# Patient Record
Sex: Female | Born: 2000 | Race: Black or African American | Hispanic: No | Marital: Single | Smoking: Current every day smoker
Health system: Southern US, Community
[De-identification: ages and names within clinical notes are randomized; demographics above are authoritative.]

## PROBLEM LIST (undated history)

## (undated) DIAGNOSIS — D649 Anemia, unspecified: Secondary | ICD-10-CM

## (undated) DIAGNOSIS — J039 Acute tonsillitis, unspecified: Secondary | ICD-10-CM

## (undated) DIAGNOSIS — I10 Essential (primary) hypertension: Secondary | ICD-10-CM

## (undated) DIAGNOSIS — N390 Urinary tract infection, site not specified: Secondary | ICD-10-CM

## (undated) HISTORY — PX: UMBILICAL HERNIA REPAIR: SHX196

---
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
## 2019-04-29 ENCOUNTER — Emergency Department (HOSPITAL_COMMUNITY)
Admission: EM | Admit: 2019-04-29 | Discharge: 2019-04-30 | Disposition: A | Discharge status: Home / Self Care | Payer: Medicaid Other | Attending: Emergency Medicine | Admitting: Emergency Medicine

## 2019-04-29 ENCOUNTER — Encounter (HOSPITAL_COMMUNITY): Payer: Self-pay | Admitting: Obstetrics and Gynecology

## 2019-04-29 ENCOUNTER — Other Ambulatory Visit: Payer: Self-pay

## 2019-04-29 DIAGNOSIS — J029 Acute pharyngitis, unspecified: Secondary | ICD-10-CM | POA: Insufficient documentation

## 2019-04-29 DIAGNOSIS — Z7722 Contact with and (suspected) exposure to environmental tobacco smoke (acute) (chronic): Secondary | ICD-10-CM | POA: Diagnosis not present

## 2019-04-29 DIAGNOSIS — J039 Acute tonsillitis, unspecified: Secondary | ICD-10-CM | POA: Diagnosis not present

## 2019-04-29 MED ORDER — DEXAMETHASONE SODIUM PHOSPHATE 10 MG/ML IJ SOLN
10.0000 mg | Freq: Once | INTRAMUSCULAR | Status: AC
  Administered 2019-04-29: 10 mg via INTRAMUSCULAR
  Filled 2019-04-29: qty 1

## 2019-04-29 MED ORDER — IBUPROFEN 100 MG/5ML PO SUSP
600.0000 mg | Freq: Once | ORAL | Status: AC
  Administered 2019-04-29: 600 mg via ORAL
  Filled 2019-04-29 (×2): qty 30

## 2019-04-29 NOTE — ED Notes (Signed)
Pt able to tolerate PO fluids without issue. 

## 2019-04-29 NOTE — ED Triage Notes (Signed)
Patient reports pain in her throat and difficulty swallowing.  Patient has grade 4 tonsils, as they are touching.  Patient is alert and oreiented x3 and reports pain with swallowing.

## 2019-04-30 ENCOUNTER — Encounter (HOSPITAL_COMMUNITY): Payer: Self-pay | Admitting: Emergency Medicine

## 2019-04-30 LAB — GROUP A STREP BY PCR: Group A Strep by PCR: NOT DETECTED

## 2019-04-30 MED ORDER — AMOXICILLIN 500 MG PO CAPS: 500.0000 mg | ORAL_CAPSULE | Freq: Three times a day (TID) | ORAL | 0 refills | Status: DC

## 2019-04-30 NOTE — ED Provider Notes (Signed)
[facility]y [facility] Provider Note   CSN: [id number] Arrival date & time: 04/29/19  2243     History   Chief Complaint Chief Complaint  Patient presents with  . Sore Throat    HPI Judy Ballard is a 18 y.o. female.     The history is provided by the patient.  Sore Throat This is a recurrent problem. The current episode started more than 2 days ago. The problem occurs constantly. The problem has not changed since onset.Pertinent negatives include no chest pain, no abdominal pain, no headaches and no shortness of breath. Nothing aggravates the symptoms. Nothing relieves the symptoms. She has tried nothing for the symptoms. The treatment provided no relief.  Sore throat for 3 days.  No change in voice.  No cough.  No SOB, no neck pain or swelling.  No f/c/r.  Was told she had enlarged tonsils but has not done anything about it.    History reviewed. No pertinent past medical history.  Patient Active Problem List   Diagnosis Date Noted  . Right knee injury, initial encounter 08/10/2017    Past Surgical History:  Procedure Laterality Date  . UMBILICAL HERNIA REPAIR       OB History   No obstetric history on file.      Home Medications    Prior to Admission medications   Not on File    Family History No family history on file.  Social History Social History   Tobacco Use  . Smoking status: Passive Smoke Exposure - Never Smoker  . Smokeless tobacco: Never Used  Substance Use Topics  . Alcohol use: No  . Drug use: Not Currently     Allergies   Apple and Fish allergy   Review of Systems Review of Systems  Constitutional: Negative for fever.  HENT: Positive for sore throat. Negative for congestion, drooling, facial swelling, trouble swallowing and voice change.   Eyes: Negative for visual disturbance.  Respiratory: Negative for shortness of breath.   Cardiovascular: Negative for chest pain.  Gastrointestinal: Negative for  abdominal pain.  Genitourinary: Negative for difficulty urinating.  Musculoskeletal: Negative for neck pain.  Skin: Negative for rash.  Neurological: Negative for headaches.  Psychiatric/Behavioral: Negative for agitation.  All other systems reviewed and are negative.    Physical Exam Updated Vital Signs BP 133/84 (BP Location: Left Arm)   Pulse (!) 108   Temp 99.1 F (37.3 C) (Oral)   SpO2 98%   Physical Exam Vitals signs and nursing note reviewed.  Constitutional:      General: She is not in acute distress.    Appearance: She is normal weight.  HENT:     Head: Normocephalic and atraumatic.     Nose: Nose normal.     Mouth/Throat:     Mouth: Mucous membranes are moist.     Pharynx: No oropharyngeal exudate or posterior oropharyngeal erythema.     Comments: Enlarged tonsils but not touching, intact phonation, no pain with displacement of the trachea Eyes:     Conjunctiva/sclera: Conjunctivae normal.     Pupils: Pupils are equal, round, and reactive to light.  Neck:     Musculoskeletal: Normal range of motion and neck supple.  Cardiovascular:     Rate and Rhythm: Normal rate and regular rhythm.     Pulses: Normal pulses.     Heart sounds: Normal heart sounds.  Pulmonary:     Effort: Pulmonary effort is normal.     Breath sounds:  Normal breath sounds.  Abdominal:     General: Abdomen is flat. Bowel sounds are normal.     Tenderness: There is no abdominal tenderness. There is no guarding or rebound.  Musculoskeletal: Normal range of motion.  Skin:    General: Skin is warm and dry.     Capillary Refill: Capillary refill takes less than 2 seconds.  Neurological:     General: No focal deficit present.     Mental Status: She is alert and oriented to person, place, and time.  Psychiatric:        Mood and Affect: Mood normal.        Behavior: Behavior normal.      ED Treatments / Results  Labs (all labs ordered are listed, but only abnormal results are displayed)  Labs Reviewed  GROUP A STREP BY PCR    EKG None  Radiology No results found.  Procedures Procedures (including critical care time)  Medications Ordered in ED Medications  ibuprofen (ADVIL) 100 MG/5ML suspension 600 mg (600 mg Oral Given 04/29/19 2349)  dexamethasone (DECADRON) injection 10 mg (10 mg Intramuscular Given 04/29/19 2352)    Follow up with ENT for ongoing care.  Will cover with PO antibiotics.  Strict return precautions given.  Alternate tylenol and ibuprofen.    Judy Ballard was evaluated in Emergency Department on 04/30/2019 for the symptoms described in the history of present illness. She was evaluated in the context of the global COVID-19 pandemic, which necessitated consideration that the patient might be at risk for infection with the SARS-CoV-2 virus that causes COVID-19. Institutional protocols and algorithms that pertain to the evaluation of patients at risk for COVID-19 are in a state of rapid change based on information released by regulatory bodies including the CDC and federal and state organizations. These policies and algorithms were followed during the patient's care in the ED.   Final Clinical Impressions(s) / ED Diagnoses   Return for intractable cough, coughing up blood,fevers >100.4 unrelieved by medication, shortness of breath, intractable vomiting, chest pain, shortness of breath, weakness,numbness, changes in speech, facial asymmetry,abdominal pain, passing out,Inability to tolerate liquids or food, cough, altered mental status or any concerns. No signs of systemic illness or infection. The patient is nontoxic-appearing on exam and vital signs are within normal limits.   I have reviewed the triage vital signs and the nursing notes. Pertinent labs &imaging results that were available during my care of the patient were reviewed by me and considered in my medical decision making (see chart for details).After history, exam, and medical workup  I feel the patient has beenappropriately medically screened and is safe for discharge home. Pertinent diagnoses were discussed with the patient. Patient was given return precautions.   Judy Weakland, MD 04/30/19 [phone]

## 2019-05-18 DIAGNOSIS — J351 Hypertrophy of tonsils: Secondary | ICD-10-CM | POA: Diagnosis not present

## 2019-05-18 DIAGNOSIS — J0391 Acute recurrent tonsillitis, unspecified: Secondary | ICD-10-CM | POA: Diagnosis not present

## 2019-07-04 ENCOUNTER — Emergency Department (HOSPITAL_COMMUNITY)
Admission: EM | Admit: 2019-07-04 | Discharge: 2019-07-04 | Disposition: A | Discharge status: Home / Self Care | Payer: Medicaid Other | Attending: Emergency Medicine | Admitting: Emergency Medicine

## 2019-07-04 ENCOUNTER — Other Ambulatory Visit: Payer: Self-pay

## 2019-07-04 DIAGNOSIS — J029 Acute pharyngitis, unspecified: Secondary | ICD-10-CM | POA: Diagnosis present

## 2019-07-04 DIAGNOSIS — Z79899 Other long term (current) drug therapy: Secondary | ICD-10-CM | POA: Diagnosis not present

## 2019-07-04 DIAGNOSIS — J039 Acute tonsillitis, unspecified: Secondary | ICD-10-CM | POA: Insufficient documentation

## 2019-07-04 DIAGNOSIS — Z7722 Contact with and (suspected) exposure to environmental tobacco smoke (acute) (chronic): Secondary | ICD-10-CM | POA: Diagnosis not present

## 2019-07-04 DIAGNOSIS — Z20828 Contact with and (suspected) exposure to other viral communicable diseases: Secondary | ICD-10-CM | POA: Insufficient documentation

## 2019-07-04 LAB — INFLUENZA PANEL BY PCR (TYPE A & B)
Influenza A By PCR: NEGATIVE
Influenza B By PCR: NEGATIVE

## 2019-07-04 LAB — GROUP A STREP BY PCR: Group A Strep by PCR: NOT DETECTED

## 2019-07-04 MED ORDER — NAPROXEN 500 MG PO TABS: 500.0000 mg | ORAL_TABLET | Freq: Two times a day (BID) | ORAL | 0 refills | Status: DC

## 2019-07-04 NOTE — ED Notes (Signed)
Pt verbalizes understanding of DC instructions. Pt belongings returned and is ambulatory out of ED.  

## 2019-07-04 NOTE — ED Provider Notes (Signed)
[facility] DE[facility] Provider Note   CSN: [id number] Arrival date & time: 07/04/19  1713     History   Chief Complaint Chief Complaint  Patient presents with  . Sore Throat    HPI DEICY RUSK is a 18 y.o. female with PMH significant for tonsillitis presents to the ED with sore throat symptoms.  Patient reports 2-day history of enlarged tonsils with white spots in the back of Ballard throat.  She also endorses headache and fevers yesterday for which she took NyQuil and TheraFlu, with good effect.  She uses hot tea for Ballard sore throat symptoms.  She recently saw an otolaryngologist for chronic tonsillitis.  She denies any inability to eat or drink, difficulty breathing, voice changes, wheezing or stridor, current headache or dizziness, or obvious sick contacts.  She admits that she has not been hydrating well and has dark urine, but no dysuria or abdominal discomfort.     HPI  No past medical history on file.  Patient Active Problem List   Diagnosis Date Noted  . Right knee injury, initial encounter 08/10/2017    Past Surgical History:  Procedure Laterality Date  . UMBILICAL HERNIA REPAIR       OB History   No obstetric history on file.      Home Medications    Prior to Admission medications   Medication Sig Start Date End Date Taking? Authorizing Provider  amoxicillin (AMOXIL) 500 MG capsule Take 1 capsule (500 mg total) by mouth 3 (three) times daily. 04/30/19   Palumbo, April, MD  naproxen (NAPROSYN) 500 MG tablet Take 1 tablet (500 mg total) by mouth 2 (two) times daily. 07/04/19   Corena Herter, PA-C    Family History No family history on file.  Social History Social History   Tobacco Use  . Smoking status: Passive Smoke Exposure - Never Smoker  . Smokeless tobacco: Never Used  Substance Use Topics  . Alcohol use: No  . Drug use: Not Currently     Allergies   Apple and Fish allergy   Review of Systems Review of  Systems  Constitutional: Positive for fever.  HENT: Positive for sore throat. Negative for postnasal drip and voice change.   Respiratory: Negative for choking, shortness of breath and wheezing.   Neurological: Positive for headaches.     Physical Exam Updated Vital Signs BP 137/87   Pulse (!) 121   Temp 98.6 F (37 C) (Oral)   Resp 18   LMP 06/17/2019   SpO2 95%   Physical Exam Vitals signs and nursing note reviewed. Exam conducted with a chaperone present.  Constitutional:      Appearance: Normal appearance.  HENT:     Head: Normocephalic and atraumatic.     Mouth/Throat:     Comments: Enlarged tonsils bilaterally.  Oropharynx patent, albeit narrowed.  Mildly erythematous with scattered exudates.  No soft palate swelling or induration.  No unilateral masses appreciated.  Symmetric throughout. Eyes:     General: No scleral icterus.    Conjunctiva/sclera: Conjunctivae normal.  Neck:     Comments: Mild right-sided lymph node enlargement and mild TTP.  No obvious neck masses or swelling appreciated.  No overlying skin changes.  No tenderness.  Full ROM intact.  No meningismus. Cardiovascular:     Rate and Rhythm: Normal rate and regular rhythm.     Pulses: Normal pulses.     Heart sounds: Normal heart sounds.  Pulmonary:     Effort: Pulmonary  effort is normal. No respiratory distress.     Breath sounds: Normal breath sounds. No stridor. No wheezing.  Skin:    General: Skin is dry.  Neurological:     Mental Status: She is alert and oriented to person, place, and time.     GCS: GCS eye subscore is 4. GCS verbal subscore is 5. GCS motor subscore is 6.  Psychiatric:        Mood and Affect: Mood normal.        Behavior: Behavior normal.        Thought Content: Thought content normal.      ED Treatments / Results  Labs (all labs ordered are listed, but only abnormal results are displayed) Labs Reviewed  GROUP A STREP BY PCR  SARS CORONAVIRUS 2 (TAT 6-24 HRS)   INFLUENZA PANEL BY PCR (TYPE A & B)    EKG None  Radiology No results found.  Procedures Procedures (including critical care time)  Medications Ordered in ED Medications - No data to display   Initial Impression / Assessment and Plan / ED Course  I have reviewed the triage vital signs and the nursing notes.  Pertinent labs & imaging results that were available during my care of the patient were reviewed by me and considered in my medical decision making (see chart for details).        Given history and physical exam findings, will obtain COVID-19, influenza, and rapid strep testing.  She reports this feels similar to Ballard episode of infectious mononucleosis, but she denies any fatigue and the brevity of Ballard illness would likely result in an inaccurate Monospot test.  Physical exam is benign demonstrates no asymmetries or masses concerning for PTA.  Patient denies any voice change and is tolerating secretions well.  Patient has a history of tonsillitis, recommending NSAIDs and honey with Ballard warm tea for anti-inflammatory effect.  Patient was discharged on 04/29/2019 for tonsillitis with amoxicillin, but patient did not believe that the antibiotic made significant difference.  Instead will prescribe naproxen for Ballard to take for Ballard inflammation.  Instructed Ballard not to take NSAIDs if she is pregnant.  Influenza panel and group A strep by PCR all negative.  COVID-19 testing pending.  Provided isolation instructions, pending results.  I personally reviewed patient's past medical record and she was seen for initial consult on 05/18/2019 by [facility] otolaryngology Jodelle Red, [place] who evaluated patient and discussed risk versus benefits of surgical tonsillectomy.  She was to consider surgery and call back Dr. Pollyann Kennedy for continued outpatient evaluation and management.  After continued episodes of tonsillitis, patient is strongly considering surgery and will follow  up with them regarding today's visit.  Recommending conservative therapy for symptomatic relief of Ballard symptoms.  All of the evaluation and work-up results were discussed with the patient and any family at bedside. They were provided opportunity to ask any additional questions and have none at this time. They have expressed understanding of verbal discharge instructions as well as return precautions and are agreeable to the plan.   ALANE HANSSEN was evaluated in Emergency Department on 07/04/2019 for the symptoms described in the history of present illness. She was evaluated in the context of the global COVID-19 pandemic, which necessitated consideration that the patient might be at risk for infection with the SARS-CoV-2 virus that causes COVID-19. Institutional protocols and algorithms that pertain to the evaluation of patients at risk for COVID-19 are in a state of rapid  change based on information released by regulatory bodies including the CDC and federal and state organizations. These policies and algorithms were followed during the patient's care in the ED.   Final Clinical Impressions(s) / ED Diagnoses   Final diagnoses:  Tonsillitis    ED Discharge Orders         Ordered    naproxen (NAPROSYN) 500 MG tablet  2 times daily     07/04/19 2016           Elvera MariaGreen, Verbena Boeding L, PA-C 07/04/19 2017    Bethann BerkshireZammit, Joseph, MD 07/06/19 [phone]

## 2019-07-04 NOTE — Discharge Instructions (Signed)
Please follow-up with your otolaryngologist regarding today's visit.  May want to consider surgical intervention for your ongoing episodes of tonsillitis.  Please take naproxen, as prescribed.  Do not take if you are pregnant.  Please also continue T with honey and other conservative OTC medications for symptomatic relief of your symptoms.  You have been tested for COVID-19.  Please isolate while your results are pending.

## 2019-07-04 NOTE — ED Triage Notes (Signed)
Pt arrived POV from home CC sore throat X 2 days. Pt denies COVID exposures. Hx Mono, tonsillitis and strep throat. Pt voice is muffled and reports 7/10 with difficulty swallowing. VSS afebrile

## 2019-07-05 LAB — SARS CORONAVIRUS 2 (TAT 6-24 HRS): SARS Coronavirus 2: NEGATIVE

## 2019-07-07 ENCOUNTER — Emergency Department (HOSPITAL_COMMUNITY)
Admission: EM | Admit: 2019-07-07 | Discharge: 2019-07-07 | Disposition: A | Discharge status: Home / Self Care | Payer: Medicaid Other | Attending: Emergency Medicine | Admitting: Emergency Medicine

## 2019-07-07 ENCOUNTER — Encounter (HOSPITAL_COMMUNITY): Payer: Self-pay | Admitting: *Deleted

## 2019-07-07 ENCOUNTER — Other Ambulatory Visit: Payer: Self-pay

## 2019-07-07 DIAGNOSIS — Z79899 Other long term (current) drug therapy: Secondary | ICD-10-CM | POA: Diagnosis not present

## 2019-07-07 DIAGNOSIS — J039 Acute tonsillitis, unspecified: Secondary | ICD-10-CM | POA: Insufficient documentation

## 2019-07-07 DIAGNOSIS — J029 Acute pharyngitis, unspecified: Secondary | ICD-10-CM | POA: Diagnosis present

## 2019-07-07 DIAGNOSIS — Z7722 Contact with and (suspected) exposure to environmental tobacco smoke (acute) (chronic): Secondary | ICD-10-CM | POA: Diagnosis not present

## 2019-07-07 LAB — POC URINE PREG, ED: Preg Test, Ur: NEGATIVE

## 2019-07-07 MED ORDER — KETOROLAC TROMETHAMINE 60 MG/2ML IM SOLN
60.0000 mg | Freq: Once | INTRAMUSCULAR | Status: AC
  Administered 2019-07-07: 60 mg via INTRAMUSCULAR
  Filled 2019-07-07: qty 2

## 2019-07-07 MED ORDER — DEXAMETHASONE SODIUM PHOSPHATE 10 MG/ML IJ SOLN
10.0000 mg | Freq: Once | INTRAMUSCULAR | Status: AC
  Administered 2019-07-07: 17:00:00 10 mg via INTRAMUSCULAR
  Filled 2019-07-07: qty 1

## 2019-07-07 MED ORDER — LIDOCAINE VISCOUS HCL 2 % MT SOLN: 10.0000 mL | Freq: Four times a day (QID) | OROMUCOSAL | 0 refills | Status: DC | PRN

## 2019-07-07 MED ORDER — LIDOCAINE VISCOUS HCL 2 % MT SOLN
15.0000 mL | Freq: Once | OROMUCOSAL | Status: AC
  Administered 2019-07-07: 15 mL via OROMUCOSAL
  Filled 2019-07-07: qty 15

## 2019-07-07 MED ORDER — METHYLPREDNISOLONE 4 MG PO TBPK: ORAL_TABLET | ORAL | 0 refills | Status: DC

## 2019-07-07 NOTE — ED Provider Notes (Signed)
[place] DEPT Provider Note   CSN: [id number] Arrival date & time: 07/07/19  1515     History Chief Complaint  Patient presents with  . Sore Throat    Judy Ballard is a 18 y.o. female with recurrent tonsillitis. She was seen on 07/04/2019 for the same thing.  She follows with [facility] otolaryngology and plans on having her tonsils removed. The patient states that she was given naproxen which has not helped at all and she read in her discharge should should return if she is having drooling.  She states she only drools at night but she does have muffled voice which is unchanged from previous visit.  She is able to swallow her own saliva however she has had a lot of pain.  She has been able to stay hydrated at home.  She denies any new fevers or worsening in her symptoms.  HPI     History reviewed. No pertinent past medical history.  Patient Active Problem List   Diagnosis Date Noted  . Right knee injury, initial encounter 08/10/2017    Past Surgical History:  Procedure Laterality Date  . UMBILICAL HERNIA REPAIR       OB History   No obstetric history on file.     No family history on file.  Social History   Tobacco Use  . Smoking status: Passive Smoke Exposure - Never Smoker  . Smokeless tobacco: Never Used  Substance Use Topics  . Alcohol use: No  . Drug use: Not Currently    Home Medications Prior to Admission medications   Medication Sig Start Date End Date Taking? Authorizing Provider  amoxicillin (AMOXIL) 500 MG capsule Take 1 capsule (500 mg total) by mouth 3 (three) times daily. 04/30/19   Palumbo, April, MD  naproxen (NAPROSYN) 500 MG tablet Take 1 tablet (500 mg total) by mouth 2 (two) times daily. 07/04/19   Corena Herter, PA-C    Allergies    Apple and Fish allergy  Review of Systems   Review of Systems Ten systems reviewed and are negative for acute change, except as noted in the HPI.    Physical Exam Updated Vital Signs BP 129/90 (BP Location: Right Arm)   Pulse 97   Temp 98.2 F (36.8 C) (Oral)   Resp 17   LMP 06/17/2019   SpO2 99%   Physical Exam Vitals and nursing note reviewed.  Constitutional:      General: She is not in acute distress.    Appearance: She is well-developed. She is not diaphoretic.  HENT:     Head: Normocephalic and atraumatic.     Mouth/Throat:     Pharynx: Uvula midline. No uvula swelling.     Tonsils: Tonsillar exudate present. No tonsillar abscesses. 4+ on the right. 4+ on the left.  Eyes:     General: No scleral icterus.    Conjunctiva/sclera: Conjunctivae normal.  Cardiovascular:     Rate and Rhythm: Normal rate and regular rhythm.     Heart sounds: Normal heart sounds. No murmur. No friction rub. No gallop.   Pulmonary:     Effort: Pulmonary effort is normal. No respiratory distress.     Breath sounds: Normal breath sounds.  Abdominal:     General: Bowel sounds are normal. There is no distension.     Palpations: Abdomen is soft. There is no mass.     Tenderness: There is no abdominal tenderness. There is no guarding.  Musculoskeletal:  Cervical back: Normal range of motion.  Skin:    General: Skin is warm and dry.  Neurological:     Mental Status: She is alert and oriented to person, place, and time.  Psychiatric:        Behavior: Behavior normal.     ED Results / Procedures / Treatments   Labs (all labs ordered are listed, but only abnormal results are displayed) Labs Reviewed  POC URINE PREG, ED    EKG None  Radiology No results found.  Procedures Procedures (including critical care time)  Medications Ordered in ED Medications  dexamethasone (DECADRON) injection 10 mg (10 mg Intramuscular Given 07/07/19 1650)  ketorolac (TORADOL) injection 60 mg (60 mg Intramuscular Given 07/07/19 1650)  lidocaine (XYLOCAINE) 2 % viscous mouth solution 15 mL (15 mLs Mouth/Throat Given 07/07/19 1649)    ED Course   I have reviewed the triage vital signs and the nursing notes.  Pertinent labs & imaging results that were available during my care of the patient were reviewed by me and considered in my medical decision making (see chart for details).    MDM Rules/Calculators/A&P     CHA2DS2/VAS Stroke Risk Points      N/A >= 2 Points: High Risk  1 - 1.99 Points: Medium Risk  0 Points: Low Risk    A final score could not be computed because of missing components.: Last  Change: N/A     This score determines the patient's risk of having a stroke if the  patient has atrial fibrillation.      This score is not applicable to this patient. Components are not  calculated.                   18 year old female here with acute tonsillitis.  She been taking naproxen without relief.  Given IM Decadron and Toradol here with improvement in her symptoms.  Patient will be started on a Medrol Dosepak.  She may continue taking naproxen at home.  I have added viscous lidocaine for throat relief.  Patient has no evidence of peritonsillar abscess or deep space infection of the neck.  Patient will be discharged to follow closely with her otolaryngologist at [facility].  She is tolerating fluids.  Patient appears appropriate for discharge at this time Final Clinical Impression(s) / ED Diagnoses Final diagnoses:  Acute tonsillitis, unspecified etiology    Rx / DC Orders ED Discharge Orders    None       Arthor Captain, PA-C 07/07/19 2045    Loren Racer, MD 07/07/19 2134

## 2019-07-07 NOTE — Discharge Instructions (Addendum)
Please follow up closely with your ENT doctor at [facility]. Get help right away if: You develop any new symptoms, such as vomiting, severe headache, stiff neck, chest pain, trouble breathing, or trouble swallowing. You have severe throat pain along with drooling because you cannot swallow your saliva or voice changes. You have severe pain that is not controlled with medicines. You cannot fully open your mouth. You develop redness, swelling, or severe pain anywhere in your neck.

## 2019-07-07 NOTE — ED Triage Notes (Signed)
Pt returns today with increase pain noted and difficultly speaking and swallowing

## 2019-07-11 ENCOUNTER — Encounter (HOSPITAL_BASED_OUTPATIENT_CLINIC_OR_DEPARTMENT_OTHER): Payer: Self-pay | Admitting: Otolaryngology

## 2019-07-11 ENCOUNTER — Other Ambulatory Visit: Payer: Self-pay

## 2019-07-12 ENCOUNTER — Other Ambulatory Visit (HOSPITAL_COMMUNITY)
Admission: RE | Admit: 2019-07-12 | Discharge: 2019-07-12 | Disposition: A | Discharge status: Home / Self Care | Payer: Medicaid Other | Source: Ambulatory Visit | Attending: Otolaryngology | Admitting: Otolaryngology

## 2019-07-12 DIAGNOSIS — Z01812 Encounter for preprocedural laboratory examination: Secondary | ICD-10-CM | POA: Diagnosis not present

## 2019-07-12 DIAGNOSIS — Z20828 Contact with and (suspected) exposure to other viral communicable diseases: Secondary | ICD-10-CM | POA: Insufficient documentation

## 2019-07-12 NOTE — H&P (Signed)
HPI:   Chief Complaint  Patient presents with  . Tonsilitis  Patient was seen in ER on 04/29/2019 for tonsilitis and was given amoxicillin. She is here due to the anitbiotics did not help.   Judy Ballard is a 18 y.o. female who presents as a new patient for sore throat. History of streptococcal tonsillitis in 2018 and 2019. She has also had mono. No history of PTA. The most recent episode of acute tonsillitis was on 04/29/19. Strep negative. She was treated with Amoxicillin, Ibuprofen and Decadron. The sore throat resolved after a few days. She feels well today other than the tonsils feel swollen. This is typical for her; at times it makes it difficult to swallow. She does not get tonsil stones.   No fever, snoring, sinonasal symptoms, throat pain, choking, oral regurgitation or hoarseness.  No PMH of cardiopulmonary disease, diabetes, bleeding disorder or adverse reaction to anesthesia.  Non-smoker.  PMH/Meds/All/SocHx/FamHx/ROS:   Past Medical History:  Diagnosis Date  . Tonsillitis   History reviewed. No pertinent surgical history.  No family history of bleeding disorders, wound healing problems or difficulty with anesthesia.   Social History   Socioeconomic History  . Marital status: Single  Spouse name: Not on file  . Number of children: Not on file  . Years of education: Not on file  . Highest education level: Not on file  Occupational History  . Not on file  Social Needs  . Financial resource strain: Not on file  . Food insecurity  Worry: Not on file  Inability: Not on file  . Transportation needs  Medical: Not on file  Non-medical: Not on file  Tobacco Use  . Smoking status: Never Smoker  . Smokeless tobacco: Never Used  Substance and Sexual Activity  . Alcohol use: Not Currently  . Drug use: Not on file  . Sexual activity: Not on file  Lifestyle  . Physical activity  Days per week: Not on file  Minutes per session: Not on file  . Stress: Not on file   Relationships  . Social Medical illustrator on phone: Not on file  Gets together: Not on file  Attends religious service: Not on file  Active member of club or organization: Not on file  Attends meetings of clubs or organizations: Not on file  Relationship status: Not on file  Other Topics Concern  . Not on file  Social History Narrative  . Not on file   No current outpatient medications on file.  A complete ROS was performed with pertinent positives/negatives noted in the HPI. The remainder of the ROS are negative.   Physical Exam:   BP 112/91  Pulse 93  Temp 97.8 F (36.6 C)  Ht 1.575 m (5\' 2" )  Wt 78.5 kg (173 lb)  BMI 31.64 kg/m   General Awake, at baseline alertness during examination.  Eyes No scleral icterus or conjunctival hemorrhage. Globe position appears normal. EOMI.  Right Ear EAC patent, TM intact w/o inflammation. Middle ear well aerated.  Left Ear EAC patent, TM intact w/o inflammation. Middle ear well aerated.  Nose Patent, no polyps or masses seen on anterior rhinoscopy.  Oral cavity No mucosal lesions or tumors seen. Tongue midline.  Oropharynx Symmetric tonsils, 4+, cryptic without erythema, cryptic debris or exudate. Uvula midline. Palatoglossal arches are symmetric. No trismus.  Neck No abnormal cervical lymphadenopathy. No thyromegaly. No thyroid masses palpated.  Cardio-vascular No cyanosis.  Pulmonary No audible stridor. Breathing easily with no labor.  Neuro Symmetric  facial movement.  Psychiatry Appropriate affect and mood for clinic visit.   Independent Review of Additional Tests or Records:  Medical records.  Procedures:  None  Impression & Plans:  Judy Ballard is a 18 y.o. female with three or four episodes of acute tonsillitis over the past three years. She has 4+ tonsils causing chronic issues with swallowing.   We discussed options going forward including continued medical therapy for recurrent tonsillitis versus tonsillectomy.  Risks and benefits of surgery discussed along with post-operative management. All questions and concerns addressed. Patient will consider surgery and call back to schedule at her convenience with Dr. Pollyann Kennedy at the outpatient surgical center.

## 2019-07-13 LAB — NOVEL CORONAVIRUS, NAA (HOSP ORDER, SEND-OUT TO REF LAB; TAT 18-24 HRS): SARS-CoV-2, NAA: NOT DETECTED

## 2019-07-15 NOTE — Anesthesia Preprocedure Evaluation (Addendum)
Anesthesia Evaluation  Patient identified by MRN, date of birth, ID band Patient awake    Reviewed: Allergy & Precautions, NPO status , Patient's Chart, lab work & pertinent test results  History of Anesthesia Complications Negative for: history of anesthetic complications  Airway Mallampati: II  TM Distance: >3 FB     Dental no notable dental hx. (+) Dental Advisory Given   Pulmonary neg pulmonary ROS,    Pulmonary exam normal        Cardiovascular negative cardio ROS Normal cardiovascular exam     Neuro/Psych negative neurological ROS     GI/Hepatic negative GI ROS, Neg liver ROS,   Endo/Other  negative endocrine ROS  Renal/GU negative Renal ROS     Musculoskeletal negative musculoskeletal ROS (+)   Abdominal   Peds  Hematology negative hematology ROS (+)   Anesthesia Other Findings Day of surgery medications reviewed with the patient.  Reproductive/Obstetrics                            Anesthesia Physical Anesthesia Plan  ASA: I  Anesthesia Plan: General   Post-op Pain Management:    Induction: Intravenous  PONV Risk Score and Plan: 4 or greater and Ondansetron, Dexamethasone, Midazolam and Scopolamine patch - Pre-op  Airway Management Planned: Oral ETT  Additional Equipment:   Intra-op Plan:   Post-operative Plan: Extubation in OR  Informed Consent: I have reviewed the patients History and Physical, chart, labs and discussed the procedure including the risks, benefits and alternatives for the proposed anesthesia with the patient or authorized representative who has indicated his/her understanding and acceptance.     Dental advisory given  Plan Discussed with: Anesthesiologist and CRNA  Anesthesia Plan Comments:        Anesthesia Quick Evaluation

## 2019-07-16 ENCOUNTER — Other Ambulatory Visit: Payer: Self-pay

## 2019-07-16 ENCOUNTER — Encounter: Payer: Self-pay | Admitting: *Deleted

## 2019-07-16 ENCOUNTER — Encounter (HOSPITAL_BASED_OUTPATIENT_CLINIC_OR_DEPARTMENT_OTHER)
Admission: RE | Disposition: A | Discharge status: Home / Self Care | Payer: Self-pay | Source: Home / Self Care | Attending: Otolaryngology

## 2019-07-16 ENCOUNTER — Ambulatory Visit (HOSPITAL_BASED_OUTPATIENT_CLINIC_OR_DEPARTMENT_OTHER): Payer: Medicaid Other | Admitting: Anesthesiology

## 2019-07-16 ENCOUNTER — Ambulatory Visit (HOSPITAL_BASED_OUTPATIENT_CLINIC_OR_DEPARTMENT_OTHER)
Admission: RE | Admit: 2019-07-16 | Discharge: 2019-07-16 | Disposition: A | Discharge status: Home / Self Care | Payer: Medicaid Other | Attending: Otolaryngology | Admitting: Otolaryngology

## 2019-07-16 DIAGNOSIS — J039 Acute tonsillitis, unspecified: Secondary | ICD-10-CM | POA: Diagnosis present

## 2019-07-16 DIAGNOSIS — Z9089 Acquired absence of other organs: Secondary | ICD-10-CM

## 2019-07-16 DIAGNOSIS — J0391 Acute recurrent tonsillitis, unspecified: Secondary | ICD-10-CM | POA: Diagnosis not present

## 2019-07-16 DIAGNOSIS — J351 Hypertrophy of tonsils: Secondary | ICD-10-CM | POA: Insufficient documentation

## 2019-07-16 HISTORY — PX: TONSILLECTOMY: SHX5217

## 2019-07-16 HISTORY — DX: Acute tonsillitis, unspecified: J03.90

## 2019-07-16 HISTORY — DX: Acquired absence of other organs: Z90.89

## 2019-07-16 LAB — POCT PREGNANCY, URINE: Preg Test, Ur: NEGATIVE

## 2019-07-16 SURGERY — TONSILLECTOMY
Anesthesia: General | Site: Mouth | Laterality: Bilateral

## 2019-07-16 MED ORDER — DEXTROSE-NACL 5-0.9 % IV SOLN: INTRAVENOUS | Status: DC

## 2019-07-16 MED ORDER — ACETAMINOPHEN 500 MG PO TABS
ORAL_TABLET | ORAL | Status: AC
  Filled 2019-07-16: qty 2

## 2019-07-16 MED ORDER — SCOPOLAMINE 1 MG/3DAYS TD PT72
1.0000 | MEDICATED_PATCH | TRANSDERMAL | Status: DC
  Administered 2019-07-16: 1.5 mg via TRANSDERMAL

## 2019-07-16 MED ORDER — ONDANSETRON HCL 4 MG/2ML IJ SOLN
INTRAMUSCULAR | Status: AC
  Filled 2019-07-16: qty 2

## 2019-07-16 MED ORDER — FENTANYL CITRATE (PF) 100 MCG/2ML IJ SOLN
25.0000 ug | INTRAMUSCULAR | Status: DC | PRN
  Administered 2019-07-16 (×2): 50 ug via INTRAVENOUS

## 2019-07-16 MED ORDER — FENTANYL CITRATE (PF) 100 MCG/2ML IJ SOLN
INTRAMUSCULAR | Status: AC
  Filled 2019-07-16: qty 2

## 2019-07-16 MED ORDER — PROMETHAZINE HCL 25 MG/ML IJ SOLN
6.2500 mg | INTRAMUSCULAR | Status: DC | PRN
  Administered 2019-07-16: 6.25 mg via INTRAVENOUS

## 2019-07-16 MED ORDER — LACTATED RINGERS IV SOLN: INTRAVENOUS | Status: DC | PRN

## 2019-07-16 MED ORDER — PROPOFOL 10 MG/ML IV BOLUS
INTRAVENOUS | Status: DC | PRN
  Administered 2019-07-16: 200 mg via INTRAVENOUS
  Administered 2019-07-16: 50 mg via INTRAVENOUS

## 2019-07-16 MED ORDER — CELECOXIB 200 MG PO CAPS
ORAL_CAPSULE | ORAL | Status: AC
  Filled 2019-07-16: qty 2

## 2019-07-16 MED ORDER — SCOPOLAMINE 1 MG/3DAYS TD PT72
MEDICATED_PATCH | TRANSDERMAL | Status: AC
  Filled 2019-07-16: qty 1

## 2019-07-16 MED ORDER — GLYCOPYRROLATE PF 0.2 MG/ML IJ SOSY
PREFILLED_SYRINGE | INTRAMUSCULAR | Status: DC | PRN
  Administered 2019-07-16: .2 mg via INTRAVENOUS

## 2019-07-16 MED ORDER — ONDANSETRON HCL 4 MG/2ML IJ SOLN
INTRAMUSCULAR | Status: DC | PRN
  Administered 2019-07-16: 4 mg via INTRAVENOUS

## 2019-07-16 MED ORDER — PROMETHAZINE HCL 25 MG PO TABS: 25.0000 mg | ORAL_TABLET | Freq: Four times a day (QID) | ORAL | Status: DC | PRN

## 2019-07-16 MED ORDER — MIDAZOLAM HCL 5 MG/5ML IJ SOLN
INTRAMUSCULAR | Status: DC | PRN
  Administered 2019-07-16: 2 mg via INTRAVENOUS

## 2019-07-16 MED ORDER — DEXAMETHASONE SODIUM PHOSPHATE 10 MG/ML IJ SOLN
INTRAMUSCULAR | Status: DC | PRN
  Administered 2019-07-16: 10 mg via INTRAVENOUS

## 2019-07-16 MED ORDER — ACETAMINOPHEN 500 MG PO TABS
1000.0000 mg | ORAL_TABLET | Freq: Once | ORAL | Status: AC
  Administered 2019-07-16: 1000 mg via ORAL

## 2019-07-16 MED ORDER — PROMETHAZINE HCL 25 MG RE SUPP: 25.0000 mg | Freq: Four times a day (QID) | RECTAL | 1 refills | Status: DC | PRN

## 2019-07-16 MED ORDER — IBUPROFEN 100 MG/5ML PO SUSP
400.0000 mg | Freq: Four times a day (QID) | ORAL | Status: DC | PRN
  Administered 2019-07-16: 400 mg via ORAL

## 2019-07-16 MED ORDER — DEXAMETHASONE SODIUM PHOSPHATE 10 MG/ML IJ SOLN
INTRAMUSCULAR | Status: AC
  Filled 2019-07-16: qty 1

## 2019-07-16 MED ORDER — PROPOFOL 10 MG/ML IV BOLUS
INTRAVENOUS | Status: AC
  Filled 2019-07-16: qty 20

## 2019-07-16 MED ORDER — HYDROCODONE-ACETAMINOPHEN 7.5-325 MG/15ML PO SOLN
10.0000 mL | ORAL | Status: DC | PRN
  Administered 2019-07-16: 15 mL via ORAL
  Filled 2019-07-16: qty 15

## 2019-07-16 MED ORDER — GLYCOPYRROLATE PF 0.2 MG/ML IJ SOSY
PREFILLED_SYRINGE | INTRAMUSCULAR | Status: AC
  Filled 2019-07-16: qty 1

## 2019-07-16 MED ORDER — HYDROCODONE-ACETAMINOPHEN 7.5-325 MG/15ML PO SOLN: 15.0000 mL | Freq: Four times a day (QID) | ORAL | 0 refills | Status: DC | PRN

## 2019-07-16 MED ORDER — LIDOCAINE 2% (20 MG/ML) 5 ML SYRINGE
INTRAMUSCULAR | Status: DC | PRN
  Administered 2019-07-16: 40 mg via INTRAVENOUS
  Administered 2019-07-16: 100 mg via INTRAVENOUS

## 2019-07-16 MED ORDER — SUCCINYLCHOLINE CHLORIDE 200 MG/10ML IV SOSY
PREFILLED_SYRINGE | INTRAVENOUS | Status: DC | PRN
  Administered 2019-07-16: 120 mg via INTRAVENOUS

## 2019-07-16 MED ORDER — PROMETHAZINE HCL 25 MG RE SUPP: 25.0000 mg | Freq: Four times a day (QID) | RECTAL | Status: DC | PRN

## 2019-07-16 MED ORDER — LIDOCAINE 2% (20 MG/ML) 5 ML SYRINGE
INTRAMUSCULAR | Status: AC
  Filled 2019-07-16: qty 5

## 2019-07-16 MED ORDER — PHENOL 1.4 % MT LIQD
1.0000 | OROMUCOSAL | Status: DC | PRN
  Administered 2019-07-16: 1 via OROMUCOSAL
  Filled 2019-07-16: qty 177

## 2019-07-16 MED ORDER — FENTANYL CITRATE (PF) 250 MCG/5ML IJ SOLN
INTRAMUSCULAR | Status: DC | PRN
  Administered 2019-07-16: 100 ug via INTRAVENOUS
  Administered 2019-07-16: 50 ug via INTRAVENOUS

## 2019-07-16 MED ORDER — MIDAZOLAM HCL 2 MG/2ML IJ SOLN
INTRAMUSCULAR | Status: AC
  Filled 2019-07-16: qty 2

## 2019-07-16 MED ORDER — PROMETHAZINE HCL 25 MG/ML IJ SOLN
INTRAMUSCULAR | Status: AC
  Filled 2019-07-16: qty 1

## 2019-07-16 MED ORDER — LACTATED RINGERS IV SOLN: INTRAVENOUS | Status: DC

## 2019-07-16 MED ORDER — SUCCINYLCHOLINE CHLORIDE 200 MG/10ML IV SOSY
PREFILLED_SYRINGE | INTRAVENOUS | Status: AC
  Filled 2019-07-16: qty 10

## 2019-07-16 MED ORDER — CELECOXIB 400 MG PO CAPS
400.0000 mg | ORAL_CAPSULE | Freq: Once | ORAL | Status: AC
  Administered 2019-07-16: 400 mg via ORAL

## 2019-07-16 MED ORDER — IBUPROFEN 100 MG/5ML PO SUSP
ORAL | Status: AC
  Filled 2019-07-16: qty 35

## 2019-07-16 SURGICAL SUPPLY — 27 items
CANISTER SUCT 1200ML W/VALVE (MISCELLANEOUS) ×2 IMPLANT
CATH ROBINSON RED A/P 12FR (CATHETERS) ×2 IMPLANT
COAGULATOR SUCT SWTCH 10FR 6 (ELECTROSURGICAL) ×2 IMPLANT
COVER BACK TABLE REUSABLE LG (DRAPES) ×2 IMPLANT
COVER MAYO STAND REUSABLE (DRAPES) ×2 IMPLANT
COVER WAND RF STERILE (DRAPES) IMPLANT
DRAPE HALF SHEET 70X43 (DRAPES) ×2 IMPLANT
ELECT COATED BLADE 2.86 ST (ELECTRODE) ×2 IMPLANT
ELECT REM PT RETURN 9FT ADLT (ELECTROSURGICAL) ×2
ELECT REM PT RETURN 9FT PED (ELECTROSURGICAL)
ELECTRODE REM PT RETRN 9FT PED (ELECTROSURGICAL) IMPLANT
ELECTRODE REM PT RTRN 9FT ADLT (ELECTROSURGICAL) ×1 IMPLANT
GAUZE SPONGE 4X4 12PLY STRL LF (GAUZE/BANDAGES/DRESSINGS) ×2 IMPLANT
GLOVE ECLIPSE 7.5 STRL STRAW (GLOVE) ×2 IMPLANT
GOWN STRL REUS W/ TWL LRG LVL3 (GOWN DISPOSABLE) ×2 IMPLANT
GOWN STRL REUS W/TWL LRG LVL3 (GOWN DISPOSABLE) ×2
MARKER SKIN DUAL TIP RULER LAB (MISCELLANEOUS) IMPLANT
NS IRRIG 1000ML POUR BTL (IV SOLUTION) ×2 IMPLANT
PENCIL FOOT CONTROL (ELECTRODE) ×2 IMPLANT
SOLUTION BUTLER CLEAR DIP (MISCELLANEOUS) IMPLANT
SPONGE TONSIL TAPE 1 RFD (DISPOSABLE) IMPLANT
SPONGE TONSIL TAPE 1.25 RFD (DISPOSABLE) ×2 IMPLANT
SYR BULB 3OZ (MISCELLANEOUS) ×2 IMPLANT
TOWEL GREEN STERILE FF (TOWEL DISPOSABLE) ×2 IMPLANT
TUBE CONNECTING 20X1/4 (TUBING) ×4 IMPLANT
TUBE SALEM SUMP 12R W/ARV (TUBING) ×2 IMPLANT
TUBE SALEM SUMP 16 FR W/ARV (TUBING) IMPLANT

## 2019-07-16 NOTE — Interval H&P Note (Signed)
History and Physical Interval Note:  07/16/2019 7:54 AM  Judy Ballard  has presented today for surgery, with the diagnosis of tonsil hypertrophy.  The various methods of treatment have been discussed with the patient and family. After consideration of risks, benefits and other options for treatment, the patient has consented to  Procedure(s): TONSILLECTOMY (Bilateral) as a surgical intervention.  The patient's history has been reviewed, patient examined, no change in status, stable for surgery.  I have reviewed the patient's chart and labs.  Questions were answered to the patient's satisfaction.     Izora Gala

## 2019-07-16 NOTE — Transfer of Care (Signed)
Immediate Anesthesia Transfer of Care Note  Patient: Judy Ballard  Procedure(s) Performed: TONSILLECTOMY (Bilateral Mouth)  Patient Location: PACU  Anesthesia Type:General  Level of Consciousness: awake, drowsy and patient cooperative  Airway & Oxygen Therapy: Patient Spontanous Breathing and Patient connected to face mask oxygen  Post-op Assessment: Report given to RN and Post -op Vital signs reviewed and stable  Post vital signs: Reviewed and stable  Last Vitals:  Vitals Value Taken Time  BP 131/79 07/16/19 0937  Temp    Pulse 107 07/16/19 0940  Resp 19 07/16/19 0940  SpO2 100 % 07/16/19 0940  Vitals shown include unvalidated device data.  Last Pain:  Vitals:   07/16/19 0709  TempSrc: Oral  PainSc: 0-No pain         Complications: No apparent anesthesia complications

## 2019-07-16 NOTE — Anesthesia Postprocedure Evaluation (Signed)
Anesthesia Post Note  Patient: Judy Ballard  Procedure(s) Performed: TONSILLECTOMY (Bilateral Mouth)     Patient location during evaluation: PACU Anesthesia Type: General Level of consciousness: sedated Pain management: pain level controlled Vital Signs Assessment: post-procedure vital signs reviewed and stable Respiratory status: spontaneous breathing and respiratory function stable Cardiovascular status: stable Postop Assessment: no apparent nausea or vomiting Anesthetic complications: no    Last Vitals:  Vitals:   07/16/19 1115 07/16/19 1215  BP: 118/62   Pulse: (!) 117 (!) 112  Resp: 20 20  Temp: (!) 36.3 C   SpO2: 94% 98%    Last Pain:  Vitals:   07/16/19 1215  TempSrc:   PainSc: 4                  Robin Petrakis DANIEL

## 2019-07-16 NOTE — Anesthesia Procedure Notes (Signed)
Procedure Name: Intubation Date/Time: 07/16/2019 8:26 AM Performed by: Renato Shin, CRNA Pre-anesthesia Checklist: Patient identified, Emergency Drugs available, Suction available and Patient being monitored Patient Re-evaluated:Patient Re-evaluated prior to induction Oxygen Delivery Method: Circle system utilized Preoxygenation: Pre-oxygenation with 100% oxygen Induction Type: IV induction Ventilation: Mask ventilation without difficulty Laryngoscope Size: Miller and 2 Grade View: Grade I Tube type: Oral Rae Tube size: 7.0 mm Number of attempts: 1 Airway Equipment and Method: Stylet and Oral airway Placement Confirmation: ETT inserted through vocal cords under direct vision,  positive ETCO2 and breath sounds checked- equal and bilateral Tube secured with: Tape Dental Injury: Teeth and Oropharynx as per pre-operative assessment

## 2019-07-16 NOTE — Discharge Instructions (Signed)
Post Anesthesia Home Care Instructions  Activity: Get plenty of rest for the remainder of the day. A responsible individual must stay with you for 24 hours following the procedure.  For the next 24 hours, DO NOT: -Drive a car -Advertising copywriter -Drink alcoholic beverages -Take any medication unless instructed by your physician -Make any legal decisions or sign important papers.  Meals: Start with liquid foods such as gelatin or soup. Progress to regular foods as tolerated. Avoid greasy, spicy, heavy foods. If nausea and/or vomiting occur, drink only clear liquids until the nausea and/or vomiting subsides. Call your physician if vomiting continues.  Special Instructions/Symptoms: Your throat may feel dry or sore from the anesthesia or the breathing tube placed in your throat during surgery. If this causes discomfort, gargle with warm salt water. The discomfort should disappear within 24 hours.  If you had a scopolamine patch placed behind your ear for the management of post- operative nausea and/or vomiting:  1. The medication in the patch is effective for 72 hours, after which it should be removed.  Wrap patch in a tissue and discard in the trash. Wash hands thoroughly with soap and water. 2. You may remove the patch earlier than 72 hours if you experience unpleasant side effects which may include dry mouth, dizziness or visual disturbances. 3. Avoid touching the patch. Wash your hands with soap and water after contact with the patch.    Tonsillectomy, Adult, Care After This sheet gives you information about how to care for yourself after your procedure. Your doctor may also give you more specific instructions. If you have problems or questions, contact your doctor. Follow these instructions at home: Eating and drinking   Follow instructions from your doctor about eating and drinking.  For many days after surgery, choose foods that are soft and cold. Examples  are: ? Gelatin. ? Sherbet. ? Ice cream. ? Frozen ice pops.  If you have an upset stomach (are nauseous), choose liquids that are cold and that you can see through (clear liquids). Examples are water and apple juice without pulp. You can try thick liquids and soft foods when you can eat without throwing up (vomiting) and without too much pain. Examples are: ? Creamed soups. ? Soft, warm cereals, such as oatmeal or hot wheat cereal. ? Milk. ? Mashed potatoes. ? Applesauce.  Drink enough fluid to keep your pee (urine) clear or pale yellow. Driving  Do not drive for 24 hours if you were given a medicine to help you relax (sedative).  Do not drive or use heavy machinery while taking prescription pain medicine or until your health care provider approves. General instructions  Rest.  Keep your head raised (elevated) when lying down.  Take medicines only as told by your doctor. These include over-the-counter medicines and prescription medicines.  Do not use mouthwashes until your doctor says it is okay.  Gargle only as told by your doctor.  Stay away from people who are sick. Contact a doctor if:  Your pain gets worse or medicines do not help.  You have a fever.  You have a rash.  You feel light-headed or you pass out (faint).  You cannot swallow even a little liquid or spit (saliva).  Your pee is very dark. Get help right away if:  You have trouble breathing.  You bleed bright red blood from your throat.  You throw up bright red blood. Summary  Follow instructions from your doctor about what you cannot eat or drink.  For many days after surgery, choose foods that are soft and cold.  Talk with your doctor about how to keep your pain under control. This can help you rest and swallow better.  Get help right away if you bleed bright red blood from your throat or you throw up bright red blood. This information is not intended to replace advice given to you by your health  care provider. Make sure you discuss any questions you have with your health care provider. Document Released: 08/14/2010 Document Revised: 06/24/2017 Document Reviewed: 06/04/2016 Elsevier Patient Education  2020 [employer].

## 2019-07-16 NOTE — Op Note (Signed)
07/16/2019  9:21 AM  PATIENT:  Judy Ballard  18 y.o. female  PRE-OPERATIVE DIAGNOSIS:  tonsil hypertrophy  POST-OPERATIVE DIAGNOSIS:  tonsil hypertrophy  PROCEDURE:  Procedure(s): TONSILLECTOMY  SURGEON:  Surgeon(s): Izora Gala, MD  ANESTHESIA:   General  COUNTS: Correct   DICTATION: The patient was taken to the operating room and placed on the operating table in the supine position. Following induction of general endotracheal anesthesia, the table was turned and the patient was draped in a standard fashion. A Crowe-Davis mouthgag was inserted into the oral cavity and used to retract the tongue and mandible, then attached to the Mayo stand.  The tonsillectomy was then performed using electrocautery dissection, carefully dissecting the avascular plane between the capsule and constrictor muscles. Cautery was used for completion of hemostasis. The tonsils were extremely large and obstructing and contained large amounts of debris in the crypts , and were discarded.  There were large caliber vessels within the tonsils and some challenges in achieving full hemostasis. A total of approximately 100cc of blood loss throughout the procedure.  The pharynx was irrigated with saline and suctioned. An oral gastric tube was used to aspirate the contents of the stomach. The patient was then awakened from anesthesia and transferred to PACU in stable condition.   PATIENT DISPOSITION:  To PACA, stable

## 2019-07-18 ENCOUNTER — Emergency Department (HOSPITAL_COMMUNITY)
Admission: EM | Admit: 2019-07-18 | Discharge: 2019-07-18 | Disposition: A | Discharge status: Home / Self Care | Payer: Medicaid Other | Source: Home / Self Care | Attending: Emergency Medicine | Admitting: Emergency Medicine

## 2019-07-18 ENCOUNTER — Emergency Department (HOSPITAL_COMMUNITY)
Admission: EM | Admit: 2019-07-18 | Discharge: 2019-07-18 | Discharge status: Against Medical Advice | Payer: Medicaid Other | Attending: Emergency Medicine | Admitting: Emergency Medicine

## 2019-07-18 ENCOUNTER — Encounter (HOSPITAL_COMMUNITY): Payer: Self-pay

## 2019-07-18 ENCOUNTER — Other Ambulatory Visit: Payer: Self-pay

## 2019-07-18 DIAGNOSIS — G8918 Other acute postprocedural pain: Secondary | ICD-10-CM | POA: Diagnosis not present

## 2019-07-18 DIAGNOSIS — Z79899 Other long term (current) drug therapy: Secondary | ICD-10-CM | POA: Insufficient documentation

## 2019-07-18 DIAGNOSIS — Z5321 Procedure and treatment not carried out due to patient leaving prior to being seen by health care provider: Secondary | ICD-10-CM

## 2019-07-18 LAB — BASIC METABOLIC PANEL
Anion gap: 12 (ref 5–15)
BUN: 10 mg/dL (ref 6–20)
CO2: 23 mmol/L (ref 22–32)
Calcium: 8.9 mg/dL (ref 8.9–10.3)
Chloride: 105 mmol/L (ref 98–111)
Creatinine, Ser: 0.61 mg/dL (ref 0.44–1.00)
GFR calc Af Amer: >60 mL/min (ref >60)
GFR calc non Af Amer: >60 mL/min (ref >60)
Glucose, Bld: 88 mg/dL (ref 70–99)
Potassium: 3.6 mmol/L (ref 3.5–5.1)
Sodium: 140 mmol/L (ref 135–145)

## 2019-07-18 LAB — CBC WITH DIFFERENTIAL/PLATELET
Abs Immature Granulocytes: 0.27 10*3/uL — ABNORMAL HIGH (ref 0.00–0.07)
Basophils Absolute: 0 10*3/uL (ref 0.0–0.1)
Basophils Relative: 0 %
Eosinophils Absolute: 0.1 10*3/uL (ref 0.0–0.5)
Eosinophils Relative: 0 %
HCT: 33.8 % — ABNORMAL LOW (ref 36.0–46.0)
Hemoglobin: 11.1 g/dL — ABNORMAL LOW (ref 12.0–15.0)
Immature Granulocytes: 1 %
Lymphocytes Relative: 4 %
Lymphs Abs: 1.1 10*3/uL (ref 0.7–4.0)
MCH: 28.5 pg (ref 26.0–34.0)
MCHC: 32.8 g/dL (ref 30.0–36.0)
MCV: 86.7 fL (ref 80.0–100.0)
Monocytes Absolute: 1.3 10*3/uL — ABNORMAL HIGH (ref 0.1–1.0)
Monocytes Relative: 5 %
Neutro Abs: 22.2 10*3/uL — ABNORMAL HIGH (ref 1.7–7.7)
Neutrophils Relative %: 90 %
Platelets: 200 10*3/uL (ref 150–400)
RBC: 3.9 MIL/uL (ref 3.87–5.11)
RDW: 13.4 % (ref 11.5–15.5)
WBC: 25 10*3/uL — ABNORMAL HIGH (ref 4.0–10.5)
nRBC: 0 % (ref 0.0–0.2)

## 2019-07-18 MED ORDER — LACTATED RINGERS IV BOLUS
1000.0000 mL | Freq: Once | INTRAVENOUS | Status: AC
  Administered 2019-07-18: 21:00:00 1000 mL via INTRAVENOUS

## 2019-07-18 MED ORDER — MORPHINE SULFATE (PF) 4 MG/ML IV SOLN
6.0000 mg | Freq: Once | INTRAVENOUS | Status: AC
  Administered 2019-07-18: 6 mg via INTRAVENOUS
  Filled 2019-07-18: qty 2

## 2019-07-18 MED ORDER — DEXAMETHASONE SODIUM PHOSPHATE 10 MG/ML IJ SOLN
10.0000 mg | Freq: Once | INTRAMUSCULAR | Status: AC
  Administered 2019-07-18: 21:00:00 10 mg via INTRAVENOUS
  Filled 2019-07-18: qty 1

## 2019-07-18 MED ORDER — SODIUM CHLORIDE 0.9 % IV BOLUS: 1000.0000 mL | Freq: Once | INTRAVENOUS | Status: DC

## 2019-07-18 MED ORDER — KETOROLAC TROMETHAMINE 15 MG/ML IJ SOLN
15.0000 mg | Freq: Once | INTRAMUSCULAR | Status: AC
  Administered 2019-07-18: 15 mg via INTRAVENOUS
  Filled 2019-07-18: qty 1

## 2019-07-18 NOTE — ED Notes (Signed)
[facility] called and sts pt came to their er-- requests for pt to be discharged to be able to pull into their system

## 2019-07-18 NOTE — Discharge Instructions (Addendum)
Follow-up with your surgeon as recommended.

## 2019-07-18 NOTE — ED Triage Notes (Signed)
Pt comes for post op problem, has tonsillectomy on Monday, unable to keep fluids down, reports painful and unable to swallow, reports spit up a little bit of blood today.

## 2019-07-18 NOTE — ED Notes (Signed)
Pt given ginger ale. No reports of emesis or difficulty swallowing.

## 2019-07-18 NOTE — ED Triage Notes (Signed)
Patient arrived stating that on Monday she had a tonsils removed and now having painful and difficult swallowing. Patient having no trouble breathing. Reports has been able to keep her medications down until today.

## 2019-07-18 NOTE — ED Notes (Signed)
Pt verbalized discharge instructions and follow up care. Alert and ambulatory. No IV. Mother is picking pt up

## 2019-07-18 NOTE — ED Notes (Signed)
Called for room x1.no answer. 

## 2019-07-18 NOTE — ED Provider Notes (Signed)
[facility]-EMERGENCY DEPT Provider Note   CSN: [id number] Arrival date & time: 07/18/19  2009     History Chief Complaint  Patient presents with  . Post-op Problem    Judy Ballard is a 18 y.o. female.  HPI   18yF with throat pain and not tolerating PO. S/p tonsillectomy 2d ago. Says today she hasn't "been able to keep anything down." She "it will just come back up" whenever she attempts to swallow. Small amount of blood tinged saliva today which has seemed to have resolved. Has been taking hydrocodone for pain.   Past Medical History:  Diagnosis Date  . Tonsillitis     Patient Active Problem List   Diagnosis Date Noted  . S/P tonsillectomy 07/16/2019  . Right knee injury, initial encounter 08/10/2017    Past Surgical History:  Procedure Laterality Date  . TONSILLECTOMY Bilateral 07/16/2019   Procedure: TONSILLECTOMY;  Surgeon: Serena Colonel, MD;  Location: [facility];  Service: ENT;  Laterality: Bilateral;  . UMBILICAL HERNIA REPAIR       OB History   No obstetric history on file.     No family history on file.  Social History   Tobacco Use  . Smoking status: Never Smoker  . Smokeless tobacco: Never Used  Substance Use Topics  . Alcohol use: No  . Drug use: Not Currently    Home Medications Prior to Admission medications   Medication Sig Start Date End Date Taking? Authorizing Provider  HYDROcodone-acetaminophen (HYCET) 7.5-325 mg/15 ml solution Take 15 mLs by mouth 4 (four) times daily as needed for moderate pain. 07/16/19   Serena Colonel, MD  lidocaine (XYLOCAINE) 2 % solution Use as directed 10 mLs in the mouth or throat every 6 (six) hours as needed for mouth pain. 07/07/19   Harris, Cammy Copa, PA-C  naproxen (NAPROSYN) 500 MG tablet Take 1 tablet (500 mg total) by mouth 2 (two) times daily. 07/04/19   Lorelee New, PA-C  promethazine (PHENERGAN) 25 MG suppository Place 1 suppository (25 mg total) rectally  every 6 (six) hours as needed for nausea or vomiting. 07/16/19   Serena Colonel, MD    Allergies    Apple and Fish allergy  Review of Systems   Review of Systems All systems reviewed and negative, other than as noted in HPI.  Physical Exam Updated Vital Signs BP 138/79 (BP Location: Right Arm)   Pulse (!) 129   Temp (!) 101.2 F (38.4 C) (Oral)   Resp 18   SpO2 95%   Physical Exam Vitals and nursing note reviewed.  Constitutional:      General: She is not in acute distress.    Appearance: She is well-developed.  HENT:     Head: Normocephalic.     Mouth/Throat:     Comments: Tonsillar fossa with white eschar b/l. No drooling or pooling of secretions. No bleeding noted. Muffled sounding voice. Neck TTP that seems appropriate for timing of procedure. No stridor.  Eyes:     General:        Right eye: No discharge.        Left eye: No discharge.     Conjunctiva/sclera: Conjunctivae normal.  Cardiovascular:     Rate and Rhythm: Regular rhythm. Tachycardia present.     Heart sounds: Normal heart sounds. No murmur. No friction rub. No gallop.   Pulmonary:     Effort: Pulmonary effort is normal. No respiratory distress.     Breath sounds: Normal breath  sounds.  Abdominal:     General: There is no distension.     Palpations: Abdomen is soft.     Tenderness: There is no abdominal tenderness.  Musculoskeletal:        General: No tenderness.     Cervical back: Neck supple.  Skin:    General: Skin is warm and dry.  Neurological:     Mental Status: She is alert.  Psychiatric:        Behavior: Behavior normal.        Thought Content: Thought content normal.     ED Results / Procedures / Treatments   Labs (all labs ordered are listed, but only abnormal results are displayed) Labs Reviewed  CBC WITH DIFFERENTIAL/PLATELET - Abnormal; Notable for the following components:      Result Value   WBC 25.0 (*)    Hemoglobin 11.1 (*)    HCT 33.8 (*)    Neutro Abs 22.2 (*)     Monocytes Absolute 1.3 (*)    Abs Immature Granulocytes 0.27 (*)    All other components within normal limits  BASIC METABOLIC PANEL    EKG None  Radiology No results found.  Procedures Procedures (including critical care time)  Medications Ordered in ED Medications  lactated ringers bolus 1,000 mL (has no administration in time range)  ketorolac (TORADOL) 15 MG/ML injection 15 mg (has no administration in time range)  morphine 4 MG/ML injection 6 mg (has no administration in time range)  dexamethasone (DECADRON) injection 10 mg (has no administration in time range)    ED Course  I have reviewed the triage vital signs and the nursing notes.  Pertinent labs & imaging results that were available during my care of the patient were reviewed by me and considered in my medical decision making (see chart for details).    MDM Rules/Calculators/A&P                      18yF post-op day 2 from tonsillectomy. Says she will regurgitate whatever she tries to swallow. She is handling her secretions. She is febrile but she doesn't appear toxic. Exam is overall within what I would expect giving timing of her surgery. I am not significantly concerned about her airway or a deep space infection. She reports blood tinged sputum earlier today. No significant bleeding noted. Eschars intact. Will place line. IVF, medications. Reassessment.   Feeling better. HR improving. Drinking gingerale. I think appropriate for DC. Return precautions discussed.   Final Clinical Impression(s) / ED Diagnoses Final diagnoses:  Post-operative pain    Rx / DC Orders ED Discharge Orders    None       Virgel Manifold, MD 07/18/19 2227

## 2019-10-10 DIAGNOSIS — G44209 Tension-type headache, unspecified, not intractable: Secondary | ICD-10-CM | POA: Diagnosis not present

## 2019-10-10 DIAGNOSIS — H40033 Anatomical narrow angle, bilateral: Secondary | ICD-10-CM | POA: Diagnosis not present

## 2019-11-04 DIAGNOSIS — H5213 Myopia, bilateral: Secondary | ICD-10-CM | POA: Diagnosis not present

## 2019-12-15 ENCOUNTER — Emergency Department (HOSPITAL_COMMUNITY)
Admission: EM | Admit: 2019-12-15 | Discharge: 2019-12-15 | Disposition: A | Discharge status: Home / Self Care | Payer: Medicaid Other | Attending: Emergency Medicine | Admitting: Emergency Medicine

## 2019-12-15 ENCOUNTER — Other Ambulatory Visit: Payer: Self-pay

## 2019-12-15 ENCOUNTER — Encounter (HOSPITAL_COMMUNITY): Payer: Self-pay | Admitting: Emergency Medicine

## 2019-12-15 ENCOUNTER — Emergency Department (HOSPITAL_COMMUNITY): Payer: Medicaid Other

## 2019-12-15 DIAGNOSIS — M79642 Pain in left hand: Secondary | ICD-10-CM | POA: Insufficient documentation

## 2019-12-15 DIAGNOSIS — Y9389 Activity, other specified: Secondary | ICD-10-CM | POA: Diagnosis not present

## 2019-12-15 DIAGNOSIS — Y92513 Shop (commercial) as the place of occurrence of the external cause: Secondary | ICD-10-CM | POA: Insufficient documentation

## 2019-12-15 DIAGNOSIS — M25532 Pain in left wrist: Secondary | ICD-10-CM | POA: Insufficient documentation

## 2019-12-15 DIAGNOSIS — M25522 Pain in left elbow: Secondary | ICD-10-CM | POA: Insufficient documentation

## 2019-12-15 DIAGNOSIS — X503XXA Overexertion from repetitive movements, initial encounter: Secondary | ICD-10-CM | POA: Insufficient documentation

## 2019-12-15 DIAGNOSIS — M79632 Pain in left forearm: Secondary | ICD-10-CM | POA: Insufficient documentation

## 2019-12-15 MED ORDER — NAPROXEN 500 MG PO TABS
500.0000 mg | ORAL_TABLET | Freq: Once | ORAL | Status: AC
  Administered 2019-12-15: 500 mg via ORAL
  Filled 2019-12-15: qty 1

## 2019-12-15 MED ORDER — NAPROXEN 500 MG PO TABS: 500.0000 mg | ORAL_TABLET | Freq: Two times a day (BID) | ORAL | 0 refills | Status: AC

## 2019-12-15 NOTE — ED Provider Notes (Signed)
[facility]-EMERGENCY DEPT Provider Note   CSN: [id number] Arrival date & time: 12/15/19  2056     History Chief Complaint  Patient presents with  . Hand Pain  . Wrist Pain  . forearm pain    Judy Ballard is a 19 y.o. female.  19 y.o female with no PMH sent to the ED with a chief complaint of left hand pain.  Does report pain began at the left palm of her hand, and radiated to her wrist and now to her forearm.  She does report a sharp sensation especially with performing her ADLs. Worst with wrist extension. She is currently employed at [employer], does meat processing and packing.  She has taken ibuprofen, and a Goody powder without improvement in her symptoms. She denies any trauma, fever, limited ROM. No prior history of IV drug use.   The history is provided by the patient and medical records.  Hand Pain  Wrist Pain       Past Medical History:  Diagnosis Date  . Tonsillitis     Patient Active Problem List   Diagnosis Date Noted  . S/P tonsillectomy 07/16/2019  . Right knee injury, initial encounter 08/10/2017    Past Surgical History:  Procedure Laterality Date  . TONSILLECTOMY Bilateral 07/16/2019   Procedure: TONSILLECTOMY;  Surgeon: Serena Colonel, MD;  Location: [facility];  Service: ENT;  Laterality: Bilateral;  . UMBILICAL HERNIA REPAIR       OB History   No obstetric history on file.     History reviewed. No pertinent family history.  Social History   Tobacco Use  . Smoking status: Never Smoker  . Smokeless tobacco: Never Used  Substance Use Topics  . Alcohol use: No  . Drug use: Not Currently    Home Medications Prior to Admission medications   Medication Sig Start Date End Date Taking? Authorizing Provider  HYDROcodone-acetaminophen (HYCET) 7.5-325 mg/15 ml solution Take 15 mLs by mouth 4 (four) times daily as needed for moderate pain. 07/16/19   Serena Colonel, MD  lidocaine (XYLOCAINE) 2 % solution  Use as directed 10 mLs in the mouth or throat every 6 (six) hours as needed for mouth pain. 07/07/19   Harris, Cammy Copa, PA-C  naproxen (NAPROSYN) 500 MG tablet Take 1 tablet (500 mg total) by mouth 2 (two) times daily for 7 days. 12/15/19 12/22/19  Claude Manges, PA-C  promethazine (PHENERGAN) 25 MG suppository Place 1 suppository (25 mg total) rectally every 6 (six) hours as needed for nausea or vomiting. 07/16/19   Serena Colonel, MD    Allergies    Apple and Fish allergy  Review of Systems   Review of Systems  Constitutional: Negative for fever.  Musculoskeletal: Positive for arthralgias and myalgias.    Physical Exam Updated Vital Signs BP (!) 124/91   Pulse 96   Temp 98.5 F (36.9 C) (Oral)   Resp 18   Ht 5\' 3"  (1.6 m)   SpO2 100%   BMI 30.42 kg/m   Physical Exam Vitals and nursing note reviewed.  Constitutional:      Appearance: Normal appearance.  HENT:     Head: Normocephalic and atraumatic.     Mouth/Throat:     Mouth: Mucous membranes are moist.  Cardiovascular:     Rate and Rhythm: Normal rate.  Pulmonary:     Effort: Pulmonary effort is normal.  Abdominal:     General: Abdomen is flat.  Musculoskeletal:  General: Tenderness present. No swelling or deformity.     Cervical back: Normal range of motion and neck supple.  Skin:    General: Skin is warm and dry.  Neurological:     Mental Status: She is alert and oriented to person, place, and time.     ED Results / Procedures / Treatments   Labs (all labs ordered are listed, but only abnormal results are displayed) Labs Reviewed - No data to display  EKG None  Radiology DG Hand Complete Left  Result Date: 12/15/2019 CLINICAL DATA:  Pain. EXAM: LEFT HAND - COMPLETE 3+ VIEW COMPARISON:  None. FINDINGS: There is no evidence of fracture or dislocation. There is no evidence of arthropathy or other focal bone abnormality. Soft tissues are unremarkable. IMPRESSION: Negative. Electronically Signed   By:  Constance Holster M.D.   On: 12/15/2019 22:14    Procedures Procedures (including critical care time)  Medications Ordered in ED Medications  naproxen (NAPROSYN) tablet 500 mg (500 mg Oral Given 12/15/19 2222)    ED Course  I have reviewed the triage vital signs and the nursing notes.  Pertinent labs & imaging results that were available during my care of the patient were reviewed by me and considered in my medical decision making (see chart for details).    MDM Rules/Calculators/A&P   Patient with no past medical history presents to the ED with complaints of left hand pain for the past few days.  She does endorse sharp pain along the palm of her hand with radiation into her wrist along with elbow.  No trauma, fevers, has full range of motion but with pain.  Initial vital signs workable for heart rate of one fifty, patient question on this and states "maybe my anxiety", recheck of vital signs by me reported afebrile, HR is 96, she does not have decrease ROM, no swelling or erythema noted to the left wrist.  Have a low suspicion for septic joint.  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  An x-ray was obtained to further evaluate any occult fracture, this was negative in nature.  Patient was provided with naproxen while in the ED along with placed on a Velcro splint for comfort.  I discussed results of her x-ray at length with her, we agreed with a course of anti-inflammatories along with rice therapy.  Patient understands and agrees with management, return precautions discussed at length.   Portions of this note were generated with [employer]. Dictation errors may occur despite best attempts at proofreading.  Final Clinical Impression(s) / ED Diagnoses Final diagnoses:  Left hand pain    Rx / DC Orders ED Discharge Orders         Ordered    naproxen (NAPROSYN) 500 MG tablet  2 times daily     12/15/19 2248             Janeece Fitting, PA-C 12/15/19 2255    Drenda Freeze, MD 12/16/19 [phone]

## 2019-12-15 NOTE — ED Triage Notes (Signed)
The patient presents with pain in her left hand from her fingers into her forearm. Pain has persisted for a week an has worsened the past few days. She now has limited mobility and has difficulties performing ADLs.

## 2019-12-15 NOTE — Discharge Instructions (Addendum)
Your xray today was normal. We have placed your left wrist on a splint, please wear this for the next few days.  I have also provided a prescription for NSAIDS, please take 1 tablet twice a day for the next 7 days.

## 2019-12-15 NOTE — ED Notes (Signed)
Patient offered fluids, tolerated well.

## 2020-01-21 DIAGNOSIS — H5213 Myopia, bilateral: Secondary | ICD-10-CM | POA: Diagnosis not present

## 2020-01-21 DIAGNOSIS — H1013 Acute atopic conjunctivitis, bilateral: Secondary | ICD-10-CM | POA: Diagnosis not present

## 2020-02-13 DIAGNOSIS — O26841 Uterine size-date discrepancy, first trimester: Secondary | ICD-10-CM | POA: Diagnosis not present

## 2020-02-13 DIAGNOSIS — Z3490 Encounter for supervision of normal pregnancy, unspecified, unspecified trimester: Secondary | ICD-10-CM | POA: Diagnosis not present

## 2020-02-13 DIAGNOSIS — Z113 Encounter for screening for infections with a predominantly sexual mode of transmission: Secondary | ICD-10-CM | POA: Diagnosis not present

## 2020-02-13 DIAGNOSIS — Z3201 Encounter for pregnancy test, result positive: Secondary | ICD-10-CM | POA: Diagnosis not present

## 2020-03-17 DIAGNOSIS — Z3482 Encounter for supervision of other normal pregnancy, second trimester: Secondary | ICD-10-CM | POA: Diagnosis not present

## 2020-03-17 DIAGNOSIS — A5609 Other chlamydial infection of lower genitourinary tract: Secondary | ICD-10-CM | POA: Diagnosis not present

## 2020-04-18 ENCOUNTER — Inpatient Hospital Stay (HOSPITAL_COMMUNITY): Payer: Medicaid Other

## 2020-04-18 ENCOUNTER — Inpatient Hospital Stay (HOSPITAL_COMMUNITY)
Admission: AD | Admit: 2020-04-18 | Discharge: 2020-04-18 | Disposition: A | Discharge status: Home / Self Care | Payer: Medicaid Other | Attending: Obstetrics | Admitting: Obstetrics

## 2020-04-18 ENCOUNTER — Other Ambulatory Visit: Payer: Self-pay

## 2020-04-18 ENCOUNTER — Encounter (HOSPITAL_COMMUNITY): Payer: Self-pay | Admitting: Obstetrics

## 2020-04-18 DIAGNOSIS — R079 Chest pain, unspecified: Secondary | ICD-10-CM | POA: Insufficient documentation

## 2020-04-18 DIAGNOSIS — R0789 Other chest pain: Secondary | ICD-10-CM | POA: Diagnosis not present

## 2020-04-18 DIAGNOSIS — R030 Elevated blood-pressure reading, without diagnosis of hypertension: Secondary | ICD-10-CM | POA: Diagnosis not present

## 2020-04-18 DIAGNOSIS — O132 Gestational [pregnancy-induced] hypertension without significant proteinuria, second trimester: Secondary | ICD-10-CM | POA: Diagnosis not present

## 2020-04-18 DIAGNOSIS — Z363 Encounter for antenatal screening for malformations: Secondary | ICD-10-CM | POA: Diagnosis not present

## 2020-04-18 DIAGNOSIS — O26892 Other specified pregnancy related conditions, second trimester: Secondary | ICD-10-CM | POA: Diagnosis not present

## 2020-04-18 DIAGNOSIS — Z3A21 21 weeks gestation of pregnancy: Secondary | ICD-10-CM | POA: Insufficient documentation

## 2020-04-18 DIAGNOSIS — O99891 Other specified diseases and conditions complicating pregnancy: Secondary | ICD-10-CM

## 2020-04-18 LAB — URINALYSIS, ROUTINE W REFLEX MICROSCOPIC
Bilirubin Urine: NEGATIVE
Glucose, UA: NEGATIVE mg/dL
Hgb urine dipstick: NEGATIVE
Ketones, ur: NEGATIVE mg/dL
Nitrite: POSITIVE — AB
Protein, ur: NEGATIVE mg/dL
Specific Gravity, Urine: 1.017 (ref 1.005–1.030)
WBC, UA: >50 WBC/hpf — ABNORMAL HIGH (ref 0–5)
pH: 5 (ref 5.0–8.0)

## 2020-04-18 LAB — CBC WITH DIFFERENTIAL/PLATELET
Abs Immature Granulocytes: 0.06 10*3/uL (ref 0.00–0.07)
Basophils Absolute: 0 10*3/uL (ref 0.0–0.1)
Basophils Relative: 0 %
Eosinophils Absolute: 0.1 10*3/uL (ref 0.0–0.5)
Eosinophils Relative: 1 %
HCT: 28 % — ABNORMAL LOW (ref 36.0–46.0)
Hemoglobin: 9.3 g/dL — ABNORMAL LOW (ref 12.0–15.0)
Immature Granulocytes: 1 %
Lymphocytes Relative: 9 %
Lymphs Abs: 0.8 10*3/uL (ref 0.7–4.0)
MCH: 29.2 pg (ref 26.0–34.0)
MCHC: 33.2 g/dL (ref 30.0–36.0)
MCV: 88.1 fL (ref 80.0–100.0)
Monocytes Absolute: 0.6 10*3/uL (ref 0.1–1.0)
Monocytes Relative: 7 %
Neutro Abs: 6.6 10*3/uL (ref 1.7–7.7)
Neutrophils Relative %: 82 %
Platelets: 171 10*3/uL (ref 150–400)
RBC: 3.18 MIL/uL — ABNORMAL LOW (ref 3.87–5.11)
RDW: 12.6 % (ref 11.5–15.5)
WBC: 8.2 10*3/uL (ref 4.0–10.5)
nRBC: 0 % (ref 0.0–0.2)

## 2020-04-18 LAB — COMPREHENSIVE METABOLIC PANEL
ALT: 13 U/L (ref 0–44)
AST: 20 U/L (ref 15–41)
Albumin: 2.7 g/dL — ABNORMAL LOW (ref 3.5–5.0)
Alkaline Phosphatase: 54 U/L (ref 38–126)
Anion gap: 8 (ref 5–15)
BUN: <5 mg/dL — ABNORMAL LOW (ref 6–20)
CO2: 21 mmol/L — ABNORMAL LOW (ref 22–32)
Calcium: 8.9 mg/dL (ref 8.9–10.3)
Chloride: 108 mmol/L (ref 98–111)
Creatinine, Ser: 0.58 mg/dL (ref 0.44–1.00)
GFR calc Af Amer: >60 mL/min (ref >60)
GFR calc non Af Amer: >60 mL/min (ref >60)
Glucose, Bld: 103 mg/dL — ABNORMAL HIGH (ref 70–99)
Potassium: 3.5 mmol/L (ref 3.5–5.1)
Sodium: 137 mmol/L (ref 135–145)
Total Bilirubin: 0.5 mg/dL (ref 0.3–1.2)
Total Protein: 6 g/dL — ABNORMAL LOW (ref 6.5–8.1)

## 2020-04-18 LAB — CBC
HCT: 27.7 % — ABNORMAL LOW (ref 36.0–46.0)
Hemoglobin: 9.3 g/dL — ABNORMAL LOW (ref 12.0–15.0)
MCH: 29.4 pg (ref 26.0–34.0)
MCHC: 33.6 g/dL (ref 30.0–36.0)
MCV: 87.7 fL (ref 80.0–100.0)
Platelets: 175 10*3/uL (ref 150–400)
RBC: 3.16 MIL/uL — ABNORMAL LOW (ref 3.87–5.11)
RDW: 12.4 % (ref 11.5–15.5)
WBC: 8.1 10*3/uL (ref 4.0–10.5)
nRBC: 0 % (ref 0.0–0.2)

## 2020-04-18 LAB — PROTEIN / CREATININE RATIO, URINE
Creatinine, Urine: 231.84 mg/dL
Protein Creatinine Ratio: 0.06 mg/mg{Cre} (ref 0.00–0.15)
Total Protein, Urine: 15 mg/dL

## 2020-04-18 LAB — BRAIN NATRIURETIC PEPTIDE: B Natriuretic Peptide: 17.4 pg/mL (ref 0.0–100.0)

## 2020-04-18 LAB — TROPONIN I (HIGH SENSITIVITY): Troponin I (High Sensitivity): <2 ng/L (ref <18)

## 2020-04-18 MED ORDER — CYCLOBENZAPRINE HCL 5 MG PO TABS
10.0000 mg | ORAL_TABLET | Freq: Once | ORAL | Status: AC
  Administered 2020-04-18: 10 mg via ORAL
  Filled 2020-04-18: qty 2

## 2020-04-18 MED ORDER — CYCLOBENZAPRINE HCL 10 MG PO TABS: 10.0000 mg | ORAL_TABLET | Freq: Two times a day (BID) | ORAL | 0 refills | Status: DC | PRN

## 2020-04-18 NOTE — MAU Provider Note (Addendum)
History     CSN: [id number]  Arrival date and time: 04/18/20 1025   Provider's first contact with patient was 1300     Chief Complaint  Patient presents with  . Hypertension  . Dizziness   Judy Ballard is a 19 y.o. year old G1P0 female at [redacted]w[redacted]d weeks gestation who presents to MAU reporting a blood pressure of 152/102 while in ultrasound today causing the doctor send her here for evaluation.  She also reports she has been slightly dizzy and having headaches but not currently.  She denies visual changes, epigastric pain or increased swelling.  She also reports chest pain/pressure in the mid chest that has been intermittent for the last 2 weeks; pain rated 7 out of 10.  She also denies fever shortness of breath vomiting or diarrhea.  Receives her care at [facility] with her next appointment being on April 24, 2020.   OB History    Gravida  1   Para      Term      Preterm      AB      Living        SAB      TAB      Ectopic      Multiple      Live Births              Past Medical History:  Diagnosis Date  . Tonsillitis     Past Surgical History:  Procedure Laterality Date  . TONSILLECTOMY Bilateral 07/16/2019   Procedure: TONSILLECTOMY;  Surgeon: Serena Colonel, MD;  Location: [facility];  Service: ENT;  Laterality: Bilateral;  . UMBILICAL HERNIA REPAIR      History reviewed. No pertinent family history.  Social History   Tobacco Use  . Smoking status: Never Smoker  . Smokeless tobacco: Never Used  Substance Use Topics  . Alcohol use: No  . Drug use: Not Currently    Allergies:  Allergies  Allergen Reactions  . Apple Hives    Swollen lips  . Fish Allergy Hives    tilapia     Medications Prior to Admission  Medication Sig Dispense Refill Last Dose  . HYDROcodone-acetaminophen (HYCET) 7.5-325 mg/15 ml solution Take 15 mLs by mouth 4 (four) times daily as needed for moderate pain. 420 mL 0   . lidocaine  (XYLOCAINE) 2 % solution Use as directed 10 mLs in the mouth or throat every 6 (six) hours as needed for mouth pain. 100 mL 0   . promethazine (PHENERGAN) 25 MG suppository Place 1 suppository (25 mg total) rectally every 6 (six) hours as needed for nausea or vomiting. 12 suppository 1     Review of Systems  Constitutional: Negative.   HENT: Negative.   Eyes: Negative.   Respiratory: Negative.   Cardiovascular: Positive for chest pain (and pressure ).  Gastrointestinal: Negative.   Endocrine: Negative.   Musculoskeletal: Negative.   Skin: Negative.   Allergic/Immunologic: Negative.   Neurological: Positive for dizziness (intermittently dizzy, but not currently) and light-headedness. Negative for headaches (occ, but none today).  Hematological: Negative.   Psychiatric/Behavioral: Negative.    Physical Exam   Patient Vitals for the past 24 hrs:  BP Temp Temp src Pulse Resp SpO2 Height Weight  04/18/20 1200 110/66 -- -- 85 -- -- -- --  04/18/20 1145 106/66 -- -- 82 -- -- -- --  04/18/20 1130 116/68 -- -- 87 -- -- -- --  04/18/20 1115 Marland Kitchen)  105/59 -- -- 89 -- -- -- --  04/18/20 1045 130/74 98.5 F (36.9 C) Oral 89 17 97 % 5\' 3"  (1.6 m) 83.5 kg     Physical Exam Vitals and nursing note reviewed.  Constitutional:      Appearance: Normal appearance. She is normal weight.  HENT:     Head: Normocephalic and atraumatic.  Cardiovascular:     Rate and Rhythm: Normal rate.     Pulses: Normal pulses.  Pulmonary:     Effort: Pulmonary effort is normal.  Genitourinary:    Comments: Not indicated Musculoskeletal:        General: Normal range of motion.     Cervical back: Normal range of motion.  Skin:    General: Skin is warm and dry.  Neurological:     Mental Status: She is alert and oriented to person, place, and time.  Psychiatric:        Mood and Affect: Mood normal.        Thought Content: Thought content normal.        Judgment: Judgment normal.    FHTs by doppler: 146  bpm  MAU Course  Procedures  MDM CCUA Orthostatic VS EKG Chest X-Ray 1 view Flexeril 10 mg --- pain resolved    Results for orders placed or performed during the hospital encounter of 04/18/20 (from the past 24 hour(s))  Urinalysis, Routine w reflex microscopic Urine, Clean Catch     Status: Abnormal   Collection Time: 04/18/20 11:20 AM  Result Value Ref Range   Color, Urine AMBER (A) YELLOW   APPearance CLOUDY (A) CLEAR   Specific Gravity, Urine 1.017 1.005 - 1.030   pH 5.0 5.0 - 8.0   Glucose, UA NEGATIVE NEGATIVE mg/dL   Hgb urine dipstick NEGATIVE NEGATIVE   Bilirubin Urine NEGATIVE NEGATIVE   Ketones, ur NEGATIVE NEGATIVE mg/dL   Protein, ur NEGATIVE NEGATIVE mg/dL   Nitrite POSITIVE (A) NEGATIVE   Leukocytes,Ua MODERATE (A) NEGATIVE   RBC / HPF 6-10 0 - 5 RBC/hpf   WBC, UA >50 (H) 0 - 5 WBC/hpf   Bacteria, UA FEW (A) NONE SEEN   Squamous Epithelial / LPF 0-5 0 - 5   Mucus PRESENT   Protein / creatinine ratio, urine     Status: None   Collection Time: 04/18/20 11:20 AM  Result Value Ref Range   Creatinine, Urine 231.84 mg/dL   Total Protein, Urine 15 mg/dL   Protein Creatinine Ratio 0.06 0.00 - 0.15 mg/mg[Cre]  CBC     Status: Abnormal   Collection Time: 04/18/20 11:49 AM  Result Value Ref Range   WBC 8.1 4.0 - 10.5 K/uL   RBC 3.16 (L) 3.87 - 5.11 MIL/uL   Hemoglobin 9.3 (L) 12.0 - 15.0 g/dL   HCT 04/20/20 (L) 36 - 46 %   MCV 87.7 80.0 - 100.0 fL   MCH 29.4 26.0 - 34.0 pg   MCHC 33.6 30.0 - 36.0 g/dL   RDW 02.5 42.7 - 06.2 %   Platelets 175 150 - 400 K/uL   nRBC 0.0 0.0 - 0.2 %  Comprehensive metabolic panel     Status: Abnormal   Collection Time: 04/18/20 11:49 AM  Result Value Ref Range   Sodium 137 135 - 145 mmol/L   Potassium 3.5 3.5 - 5.1 mmol/L   Chloride 108 98 - 111 mmol/L   CO2 21 (L) 22 - 32 mmol/L   Glucose, Bld 103 (H) 70 - 99 mg/dL   BUN <  5 (L) 6 - 20 mg/dL   Creatinine, Ser 7.41 0.44 - 1.00 mg/dL   Calcium 8.9 8.9 - 63.8 mg/dL   Total  Protein 6.0 (L) 6.5 - 8.1 g/dL   Albumin 2.7 (L) 3.5 - 5.0 g/dL   AST 20 15 - 41 U/L   ALT 13 0 - 44 U/L   Alkaline Phosphatase 54 38 - 126 U/L   Total Bilirubin 0.5 0.3 - 1.2 mg/dL   GFR calc non Af Amer >60 >60 mL/min   GFR calc Af Amer >60 >60 mL/min   Anion gap 8 5 - 15  Troponin I (High Sensitivity)     Status: None   Collection Time: 04/18/20 11:49 AM  Result Value Ref Range   Troponin I (High Sensitivity) <2 <18 ng/L  CBC with Differential/Platelet     Status: Abnormal   Collection Time: 04/18/20 11:49 AM  Result Value Ref Range   WBC 8.2 4.0 - 10.5 K/uL   RBC 3.18 (L) 3.87 - 5.11 MIL/uL   Hemoglobin 9.3 (L) 12.0 - 15.0 g/dL   HCT 45.3 (L) 36 - 46 %   MCV 88.1 80.0 - 100.0 fL   MCH 29.2 26.0 - 34.0 pg   MCHC 33.2 30.0 - 36.0 g/dL   RDW 64.6 80.3 - 21.2 %   Platelets 171 150 - 400 K/uL   nRBC 0.0 0.0 - 0.2 %   Neutrophils Relative % 82 %   Neutro Abs 6.6 1.7 - 7.7 K/uL   Lymphocytes Relative 9 %   Lymphs Abs 0.8 0.7 - 4.0 K/uL   Monocytes Relative 7 %   Monocytes Absolute 0.6 0 - 1 K/uL   Eosinophils Relative 1 %   Eosinophils Absolute 0.1 0 - 0 K/uL   Basophils Relative 0 %   Basophils Absolute 0.0 0 - 0 K/uL   Immature Granulocytes 1 %   Abs Immature Granulocytes 0.06 0.00 - 0.07 K/uL    DG Chest Portable 1 View  Result Date: 04/18/2020 CLINICAL DATA:  Chest pain. EXAM: PORTABLE CHEST 1 VIEW COMPARISON:  April 23, 2017. FINDINGS: The heart size and mediastinal contours are within normal limits. Both lungs are clear. No pneumothorax or pleural effusion is noted. The visualized skeletal structures are unremarkable. IMPRESSION: No active disease. Electronically Signed   By: Lupita Raider M.D.   On: 04/18/2020 13:08    Assessment and Plan  Musculoskeletal chest pain - Reassurance given in setting of normal EKG and resolution of pain with muscle relaxer - Rx for Flexeril 10 mg every 12 hrs prn MSK pain - Information provided on chest wall pain  Transient  elevated blood pressure - Reassurance given that VSs are all WNL; first one was more than likely elevated d/t being nervous coming to the hospital  - Discharge patient - Keep scheduled appt with [facility] on9/30/21 - Patient verbalized an understanding of the plan of care and agrees.    Raelyn Mora, MSN, CNM 04/18/2020, 2:53 PM

## 2020-04-18 NOTE — Discharge Instructions (Signed)
Your blood pressure has been normal and lower than it was in ultrasound today.

## 2020-04-18 NOTE — MAU Note (Signed)
Had 20 wk [place] today, 158/102. Dr sent her here for further eval.  Has been dizzy(slightly) and having headaches(not currently). Denies visual changes, epigastric pain or in crease in swelling.  Chest pain, pressure mid chest, started this morning, comes and goes. Denies fever, SOB, vomiting or diarrhea.

## 2020-04-21 LAB — CULTURE, OB URINE: Culture: >=100000 — AB

## 2020-04-22 ENCOUNTER — Encounter (HOSPITAL_COMMUNITY): Payer: Self-pay | Admitting: Emergency Medicine

## 2020-04-22 ENCOUNTER — Telehealth: Payer: Self-pay | Admitting: Certified Nurse Midwife

## 2020-04-22 ENCOUNTER — Emergency Department (HOSPITAL_COMMUNITY): Payer: Medicaid Other

## 2020-04-22 ENCOUNTER — Other Ambulatory Visit: Payer: Self-pay

## 2020-04-22 DIAGNOSIS — R531 Weakness: Secondary | ICD-10-CM | POA: Diagnosis not present

## 2020-04-22 DIAGNOSIS — O2342 Unspecified infection of urinary tract in pregnancy, second trimester: Secondary | ICD-10-CM | POA: Insufficient documentation

## 2020-04-22 DIAGNOSIS — E871 Hypo-osmolality and hyponatremia: Secondary | ICD-10-CM | POA: Insufficient documentation

## 2020-04-22 DIAGNOSIS — Z20822 Contact with and (suspected) exposure to covid-19: Secondary | ICD-10-CM | POA: Insufficient documentation

## 2020-04-22 DIAGNOSIS — O99012 Anemia complicating pregnancy, second trimester: Secondary | ICD-10-CM | POA: Diagnosis not present

## 2020-04-22 DIAGNOSIS — Z3A21 21 weeks gestation of pregnancy: Secondary | ICD-10-CM | POA: Diagnosis not present

## 2020-04-22 DIAGNOSIS — O219 Vomiting of pregnancy, unspecified: Secondary | ICD-10-CM | POA: Insufficient documentation

## 2020-04-22 DIAGNOSIS — D649 Anemia, unspecified: Secondary | ICD-10-CM | POA: Diagnosis not present

## 2020-04-22 DIAGNOSIS — E876 Hypokalemia: Secondary | ICD-10-CM | POA: Diagnosis not present

## 2020-04-22 LAB — CBC WITH DIFFERENTIAL/PLATELET
Abs Immature Granulocytes: 0.1 10*3/uL — ABNORMAL HIGH (ref 0.00–0.07)
Basophils Absolute: 0 10*3/uL (ref 0.0–0.1)
Basophils Relative: 0 %
Eosinophils Absolute: 0 10*3/uL (ref 0.0–0.5)
Eosinophils Relative: 0 %
HCT: 27 % — ABNORMAL LOW (ref 36.0–46.0)
Hemoglobin: 9.4 g/dL — ABNORMAL LOW (ref 12.0–15.0)
Immature Granulocytes: 1 %
Lymphocytes Relative: 5 %
Lymphs Abs: 0.5 10*3/uL — ABNORMAL LOW (ref 0.7–4.0)
MCH: 29.7 pg (ref 26.0–34.0)
MCHC: 34.8 g/dL (ref 30.0–36.0)
MCV: 85.4 fL (ref 80.0–100.0)
Monocytes Absolute: 0.9 10*3/uL (ref 0.1–1.0)
Monocytes Relative: 9 %
Neutro Abs: 9.3 10*3/uL — ABNORMAL HIGH (ref 1.7–7.7)
Neutrophils Relative %: 85 %
Platelets: 172 10*3/uL (ref 150–400)
RBC: 3.16 MIL/uL — ABNORMAL LOW (ref 3.87–5.11)
RDW: 12.4 % (ref 11.5–15.5)
WBC: 10.8 10*3/uL — ABNORMAL HIGH (ref 4.0–10.5)
nRBC: 0 % (ref 0.0–0.2)

## 2020-04-22 LAB — COMPREHENSIVE METABOLIC PANEL
ALT: 9 U/L (ref 0–44)
AST: 16 U/L (ref 15–41)
Albumin: 2.9 g/dL — ABNORMAL LOW (ref 3.5–5.0)
Alkaline Phosphatase: 82 U/L (ref 38–126)
Anion gap: 10 (ref 5–15)
BUN: 10 mg/dL (ref 6–20)
CO2: 20 mmol/L — ABNORMAL LOW (ref 22–32)
Calcium: 8.6 mg/dL — ABNORMAL LOW (ref 8.9–10.3)
Chloride: 102 mmol/L (ref 98–111)
Creatinine, Ser: 0.8 mg/dL (ref 0.44–1.00)
GFR calc Af Amer: >60 mL/min (ref >60)
GFR calc non Af Amer: >60 mL/min (ref >60)
Glucose, Bld: 117 mg/dL — ABNORMAL HIGH (ref 70–99)
Potassium: 3.3 mmol/L — ABNORMAL LOW (ref 3.5–5.1)
Sodium: 132 mmol/L — ABNORMAL LOW (ref 135–145)
Total Bilirubin: 2.1 mg/dL — ABNORMAL HIGH (ref 0.3–1.2)
Total Protein: 7.1 g/dL (ref 6.5–8.1)

## 2020-04-22 LAB — PROTIME-INR
INR: >10 (ref 0.8–1.2)
Prothrombin Time: >90 seconds — ABNORMAL HIGH (ref 11.4–15.2)

## 2020-04-22 LAB — LACTIC ACID, PLASMA: Lactic Acid, Venous: 1.1 mmol/L (ref 0.5–1.9)

## 2020-04-22 LAB — I-STAT BETA HCG BLOOD, ED (NOT ORDERABLE): I-stat hCG, quantitative: >2000 m[IU]/mL — ABNORMAL HIGH (ref <5)

## 2020-04-22 MED ORDER — CEFADROXIL 500 MG PO CAPS: 500.0000 mg | ORAL_CAPSULE | Freq: Two times a day (BID) | ORAL | 0 refills | Status: DC

## 2020-04-22 NOTE — ED Notes (Signed)
Date and time results received: 04/22/20 2340 (use smartphrase ".now" to insert current time)  Test: INR Critical Value:>10  Name of Provider Notified: Dr Preston Fleeting  Orders Received? Or Actions Taken?: none at this time

## 2020-04-22 NOTE — Telephone Encounter (Signed)
+  [facility]I. No answer, left message to call back. Rx sent.

## 2020-04-22 NOTE — ED Triage Notes (Signed)
Patient has been vomiting and having a fever since Saturday. Patient states that she is [redacted] weeks pregnant

## 2020-04-23 ENCOUNTER — Emergency Department (HOSPITAL_COMMUNITY)
Admission: EM | Admit: 2020-04-23 | Discharge: 2020-04-23 | Disposition: A | Discharge status: Home / Self Care | Payer: Medicaid Other | Attending: Emergency Medicine | Admitting: Emergency Medicine

## 2020-04-23 DIAGNOSIS — O2342 Unspecified infection of urinary tract in pregnancy, second trimester: Secondary | ICD-10-CM

## 2020-04-23 DIAGNOSIS — E871 Hypo-osmolality and hyponatremia: Secondary | ICD-10-CM

## 2020-04-23 DIAGNOSIS — E876 Hypokalemia: Secondary | ICD-10-CM

## 2020-04-23 DIAGNOSIS — Z3492 Encounter for supervision of normal pregnancy, unspecified, second trimester: Secondary | ICD-10-CM

## 2020-04-23 DIAGNOSIS — R112 Nausea with vomiting, unspecified: Secondary | ICD-10-CM

## 2020-04-23 DIAGNOSIS — N390 Urinary tract infection, site not specified: Secondary | ICD-10-CM

## 2020-04-23 DIAGNOSIS — D649 Anemia, unspecified: Secondary | ICD-10-CM

## 2020-04-23 LAB — PROTIME-INR
INR: 1.1 (ref 0.8–1.2)
Prothrombin Time: 13.5 seconds (ref 11.4–15.2)

## 2020-04-23 LAB — URINALYSIS, ROUTINE W REFLEX MICROSCOPIC
Glucose, UA: >=500 mg/dL — AB
Ketones, ur: 20 mg/dL — AB
Nitrite: NEGATIVE
Protein, ur: 100 mg/dL — AB
Specific Gravity, Urine: 1.018 (ref 1.005–1.030)
WBC, UA: >50 WBC/hpf — ABNORMAL HIGH (ref 0–5)
pH: 5 (ref 5.0–8.0)

## 2020-04-23 LAB — RESPIRATORY PANEL BY RT PCR (FLU A&B, COVID)
Influenza A by PCR: NEGATIVE
Influenza B by PCR: NEGATIVE
SARS Coronavirus 2 by RT PCR: NEGATIVE

## 2020-04-23 MED ORDER — SODIUM CHLORIDE 0.9 % IV SOLN
2.0000 g | Freq: Once | INTRAVENOUS | Status: AC
  Administered 2020-04-23: 2 g via INTRAVENOUS
  Filled 2020-04-23: qty 20

## 2020-04-23 MED ORDER — CEFADROXIL 500 MG PO CAPS: 500.0000 mg | ORAL_CAPSULE | Freq: Two times a day (BID) | ORAL | 0 refills | Status: DC

## 2020-04-23 MED ORDER — DEXTROSE 5 % IN LACTATED RINGERS IV BOLUS
1000.0000 mL | Freq: Once | INTRAVENOUS | Status: AC
  Administered 2020-04-23: 1000 mL via INTRAVENOUS

## 2020-04-23 MED ORDER — METOCLOPRAMIDE HCL 5 MG/ML IJ SOLN
10.0000 mg | Freq: Once | INTRAMUSCULAR | Status: AC
  Administered 2020-04-23: 10 mg via INTRAVENOUS
  Filled 2020-04-23: qty 2

## 2020-04-23 MED ORDER — POTASSIUM CHLORIDE CRYS ER 20 MEQ PO TBCR: 20.0000 meq | EXTENDED_RELEASE_TABLET | Freq: Two times a day (BID) | ORAL | 0 refills | Status: DC

## 2020-04-23 MED ORDER — ONDANSETRON HCL 4 MG/2ML IJ SOLN
4.0000 mg | Freq: Once | INTRAMUSCULAR | Status: AC
  Administered 2020-04-23: 4 mg via INTRAVENOUS
  Filled 2020-04-23: qty 2

## 2020-04-23 MED ORDER — METOCLOPRAMIDE HCL 10 MG PO TABS: 10.0000 mg | ORAL_TABLET | Freq: Four times a day (QID) | ORAL | 0 refills | Status: DC | PRN

## 2020-04-23 MED ORDER — DIPHENHYDRAMINE HCL 50 MG/ML IJ SOLN
25.0000 mg | Freq: Once | INTRAMUSCULAR | Status: AC
  Administered 2020-04-23: 25 mg via INTRAVENOUS
  Filled 2020-04-23: qty 1

## 2020-04-23 MED ORDER — POTASSIUM CHLORIDE CRYS ER 20 MEQ PO TBCR
40.0000 meq | EXTENDED_RELEASE_TABLET | Freq: Once | ORAL | Status: AC
  Administered 2020-04-23: 40 meq via ORAL
  Filled 2020-04-23: qty 2

## 2020-04-23 NOTE — Discharge Instructions (Addendum)
Make sure you take the antibiotic that had been prescribed for you (cefadroxil).  If you are vomiting in spite of the medication prescribed today (metoclopramide), then go to the MAU (maternal admissions unit) at [facility]

## 2020-04-23 NOTE — ED Provider Notes (Signed)
[facility]-EMERGENCY DEPT Provider Note   CSN: [id number] Arrival date & time: 04/22/20  2210   History Chief Complaint  Patient presents with  . Fever  . Emesis    Judy Ballard is a 19 y.o. female.  The history is provided by the patient.  Fever Associated symptoms: vomiting   Emesis Associated symptoms: fever   She is [redacted] weeks pregnant and comes in with 3-day history of fever, chills, sweats, nausea, vomiting.  She does have a prescription for ondansetron at home, but vomited every time she took it.  She has not been able to hold anything down.  She denies diarrhea.  She denies sore throat, cough.  She has noted decreased urine output.  There has been fetal movement.  Pregnancy has been uncomplicated.  She is gravida 1, para 0.  She is receiving prenatal care.  Past Medical History:  Diagnosis Date  . Tonsillitis     Patient Active Problem List   Diagnosis Date Noted  . S/P tonsillectomy 07/16/2019  . Right knee injury, initial encounter 08/10/2017    Past Surgical History:  Procedure Laterality Date  . TONSILLECTOMY Bilateral 07/16/2019   Procedure: TONSILLECTOMY;  Surgeon: Serena Colonel, MD;  Location: [facility];  Service: ENT;  Laterality: Bilateral;  . UMBILICAL HERNIA REPAIR       OB History    Gravida  1   Para      Term      Preterm      AB      Living        SAB      TAB      Ectopic      Multiple      Live Births              History reviewed. No pertinent family history.  Social History   Tobacco Use  . Smoking status: Never Smoker  . Smokeless tobacco: Never Used  Substance Use Topics  . Alcohol use: No  . Drug use: Not Currently    Home Medications Prior to Admission medications   Medication Sig Start Date End Date Taking? Authorizing Provider  famotidine (PEPCID) 10 MG tablet Take 10 mg by mouth daily.   Yes [provider]  ondansetron (ZOFRAN-ODT) 8 MG  disintegrating tablet Take 8 mg by mouth 3 (three) times daily as needed for nausea.  04/19/20  Yes [provider]  promethazine (PHENERGAN) 25 MG tablet Take 25 mg by mouth daily as needed for nausea.  03/18/20  Yes [provider]  cefadroxil (DURICEF) 500 MG capsule Take 1 capsule (500 mg total) by mouth 2 (two) times daily. 04/22/20   Donette Larry, CNM  cyclobenzaprine (FLEXERIL) 10 MG tablet Take 1 tablet (10 mg total) by mouth 2 (two) times daily as needed for muscle spasms. 04/18/20   Raelyn Mora, CNM    Allergies    Apple and Fish allergy  Review of Systems   Review of Systems  Constitutional: Positive for fever.  Gastrointestinal: Positive for vomiting.  All other systems reviewed and are negative.   Physical Exam Updated Vital Signs BP 111/62 (BP Location: Left Arm)   Pulse (!) 129   Temp 100.3 F (37.9 C) (Oral)   Resp 16   Ht 5\' 3"  (1.6 m)   Wt 83.5 kg   LMP 06/17/2019 (Exact Date)   SpO2 98%   BMI 32.59 kg/m   Physical Exam Vitals and nursing note reviewed.  19 year old female, resting comfortably and in no acute distress. Vital signs are significant for low-grade fever and rapid heart rate. Oxygen saturation is 98%, which is normal. Head is normocephalic and atraumatic. PERRLA, EOMI. Oropharynx is clear. Neck is nontender and supple without adenopathy or JVD. Back is nontender and there is no CVA tenderness. Lungs are clear without rales, wheezes, or rhonchi. Chest is nontender. Heart has regular rate and rhythm without murmur. Abdomen is soft, flat, nontender without masses or hepatosplenomegaly and peristalsis is hypoactive.  Gravid uterus is present with size consistent with dates. Extremities have no cyanosis or edema, full range of motion is present. Skin is warm and dry without rash. Neurologic: Mental status is normal, cranial nerves are intact, there are no motor or sensory deficits.  ED Results / Procedures / Treatments     Labs (all labs ordered are listed, but only abnormal results are displayed) Labs Reviewed  COMPREHENSIVE METABOLIC PANEL - Abnormal; Notable for the following components:      Result Value   Sodium 132 (*)    Potassium 3.3 (*)    CO2 20 (*)    Glucose, Bld 117 (*)    Calcium 8.6 (*)    Albumin 2.9 (*)    Total Bilirubin 2.1 (*)    All other components within normal limits  CBC WITH DIFFERENTIAL/PLATELET - Abnormal; Notable for the following components:   WBC 10.8 (*)    RBC 3.16 (*)    Hemoglobin 9.4 (*)    HCT 27.0 (*)    Neutro Abs 9.3 (*)    Lymphs Abs 0.5 (*)    Abs Immature Granulocytes 0.10 (*)    All other components within normal limits  PROTIME-INR - Abnormal; Notable for the following components:   Prothrombin Time >90.0 (*)    INR >10.0 (*)    All other components within normal limits  I-STAT BETA HCG BLOOD, ED (NOT ORDERABLE) - Abnormal; Notable for the following components:   I-stat hCG, quantitative >2,000.0 (*)    All other components within normal limits  RESPIRATORY PANEL BY RT PCR (FLU A&B, COVID)  CULTURE, BLOOD (ROUTINE X 2)  CULTURE, BLOOD (ROUTINE X 2)  LACTIC ACID, PLASMA  LACTIC ACID, PLASMA  URINALYSIS, ROUTINE W REFLEX MICROSCOPIC  I-STAT BETA HCG BLOOD, ED ([facility], [facility], [facility] ONLY)   Radiology DG Chest 2 View  Result Date: 04/22/2020 CLINICAL DATA:  Nausea, vomiting, and weakness for 1 week. Twenty-one weeks pregnant. EXAM: CHEST - 2 VIEW COMPARISON:  04/18/2020 FINDINGS: The heart size and mediastinal contours are within normal limits. Both lungs are clear. The visualized skeletal structures are unremarkable. IMPRESSION: No active cardiopulmonary disease. Electronically Signed   By: Burman Nieves M.D.   On: 04/22/2020 23:32    Procedures Procedures  CRITICAL CARE Performed by: Dione Booze Total critical care time: 35 minutes Critical care time was exclusive of separately billable procedures and treating other patients. Critical care was  necessary to treat or prevent imminent or life-threatening deterioration. Critical care was time spent personally by me on the following activities: development of treatment plan with patient and/or surrogate as well as nursing, discussions with consultants, evaluation of patient's response to treatment, examination of patient, obtaining history from patient or surrogate, ordering and performing treatments and interventions, ordering and review of laboratory studies, ordering and review of radiographic studies, pulse oximetry and re-evaluation of patient's condition.  Medications Ordered in ED Medications  ondansetron (ZOFRAN) injection 4 mg (4 mg Intravenous Given 04/23/20  1610)  dextrose 5% lactated ringers bolus 1,000 mL (0 mLs Intravenous Stopped 04/23/20 0305)  potassium chloride SA (KLOR-CON) CR tablet 40 mEq (40 mEq Oral Given 04/23/20 0204)  metoCLOPramide (REGLAN) injection 10 mg (10 mg Intravenous Given 04/23/20 0429)  diphenhydrAMINE (BENADRYL) injection 25 mg (25 mg Intravenous Given 04/23/20 0430)  dextrose 5% lactated ringers bolus 1,000 mL (0 mLs Intravenous Stopped 04/23/20 0540)  cefTRIAXone (ROCEPHIN) 2 g in sodium chloride 0.9 % 100 mL IVPB (0 g Intravenous Stopped 04/23/20 0630)    ED Course  I have reviewed the triage vital signs and the nursing notes.  Pertinent labs & imaging results that were available during my care of the patient were reviewed by me and considered in my medical decision making (see chart for details).  MDM Rules/Calculators/A&P Fever, vomiting and pregnant patient.  She does not appear toxic.  Old records are reviewed, and she had a visit at the [facility] on 9/24 at which time urinalysis had positive nitrite.  Clearly, urinary tract infection is contributing to her fever.  Vomiting could be related to the UTI versus superimposed viral gastritis.  No evidence of bowel obstruction, peptic ulcer disease, pancreatitis.  Chest x-ray today shows no acute process.  Labs  show mild hyponatremia and hypokalemia with falls modest elevation of bilirubin.  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  Anemia is present and unchanged from baseline.  She will be given IV fluids and IV ondansetron.  Will need to get urine sample for culture and will start on antibiotics once urine is obtained.  Please note that labs obtained at triage also include INR of greater than 10.  Nursing aide states that I was advised of this result.  This is not the case, I discovered the result only when I went to evaluate the patient.  This is most likely a lab error and will be repeated.  Repeat INR is normal confirming that original result was a lab error.  She continued to have nausea following ondansetron and was given intravenous metoclopramide.  Following this, nausea was completely relieved.  She was given a fluid challenge which she was able to tolerate.  She was given dose of ceftriaxone.  Apparently, she had a prescription sent in for cefadroxil, she is to take that medication.  She is given a prescription for metoclopramide and given strict return precautions should vomiting recur.  Recommended that she follow-up at the [facility] since she if she needs to be admitted, she would be [facility] to the [facility].  Patient expresses understanding.  Also given prescription for potassium supplement.  Final Clinical Impression(s) / ED Diagnoses Final diagnoses:  Non-intractable vomiting with nausea, unspecified vomiting type  Urinary tract infection without hematuria, site unspecified  Second trimester pregnancy  Normochromic normocytic anemia  Hypokalemia  Hyponatremia    Rx / DC Orders ED Discharge Orders         Ordered    metoCLOPramide (REGLAN) 10 MG tablet  Every 6 hours PRN        04/23/20 0722    potassium chloride SA (KLOR-CON) 20 MEQ tablet  2 times daily        04/23/20 0724           Dione Booze, MD 04/23/20 [phone]

## 2020-04-23 NOTE — ED Notes (Signed)
Baby FHR checked and 158.

## 2020-04-24 ENCOUNTER — Telehealth: Payer: Self-pay | Admitting: *Deleted

## 2020-04-24 ENCOUNTER — Telehealth: Payer: Self-pay

## 2020-04-24 LAB — URINE CULTURE: Culture: >=100000 — AB

## 2020-04-24 NOTE — Telephone Encounter (Signed)
Called patient to assist with scheduling follow up appointment ,no answer message left . Essense Bousquet PEC [phone]

## 2020-04-24 NOTE — Telephone Encounter (Signed)
Transition Care Management Unsuccessful Follow-up Telephone Call  Date of discharge and from where:  04/23/2020 from [facility]  Attempts:  1st Attempt  Reason for unsuccessful TCM follow-up call:  Left voice message

## 2020-04-25 ENCOUNTER — Telehealth: Payer: Self-pay

## 2020-04-25 NOTE — Telephone Encounter (Signed)
Transition Care Management Unsuccessful Follow-up Telephone Call  Date of discharge and from where:  04/23/2020 from [facility]  Attempts:  2nd Attempt  Reason for unsuccessful TCM follow-up call:  Left voice message

## 2020-04-25 NOTE — Telephone Encounter (Signed)
Post ED Visit - Positive Culture Follow-up  Culture report reviewed by antimicrobial stewardship pharmacist: [facility] []  , Pharm.D. []  Enzo Bi, Pharm.D., BCPS AQ-ID []  , Pharm.D., BCPS []  Celedonio Miyamoto, Pharm.D., BCPS []  [place], Garvin Fila.D., BCPS, AAHIVP []  , Pharm.D., BCPS, AAHIVP []  Georgina Pillion, PharmD, BCPS []  , PharmD, BCPS []  [place], PharmD, BCPS []  [address], PharmD []  , PharmD, BCPS []  Estella Husk, PharmD  Pharmacy Team []  Lysle Pearl, PharmD []  , PharmD []  Phillips Climes, PharmD []  , Rph []  Agapito Games) , PharmD []  Verlan Friends, PharmD []  , PharmD []  Mervyn Gay, PharmD []  , PharmD []  Vinnie Level, PharmD []  [facility], PharmD []  , PharmD []  Len Childs, PharmD Pharm D  Positive urine culture Treated with Cefadroxil, organism sensitive to the same and no further patient follow-up is required at this time.  Greer Pickerel 04/25/2020, 10:24 AM

## 2020-04-27 ENCOUNTER — Telehealth (HOSPITAL_BASED_OUTPATIENT_CLINIC_OR_DEPARTMENT_OTHER): Payer: Self-pay | Admitting: Emergency Medicine

## 2020-04-27 LAB — BLOOD CULTURE ID PANEL (REFLEXED) - BCID2

## 2020-04-28 LAB — CULTURE, BLOOD (ROUTINE X 2)
Culture: NO GROWTH
Special Requests: ADEQUATE

## 2020-04-28 NOTE — Telephone Encounter (Signed)
Transition Care Management Unsuccessful Follow-up Telephone Call  Date of discharge and from where:  04/23/2020 from [facility]  Attempts:  3rd Attempt  Reason for unsuccessful TCM follow-up call:  Left voice message

## 2020-04-29 LAB — CULTURE, BLOOD (ROUTINE X 2): Special Requests: ADEQUATE

## 2020-04-30 ENCOUNTER — Telehealth: Payer: Self-pay | Admitting: Emergency Medicine

## 2020-04-30 NOTE — Telephone Encounter (Signed)
Post ED Visit - Positive Culture Follow-up: Successful Patient Follow-Up  Culture assessed and recommendations reviewed by:  []  , Pharm.D. []  Enzo Bi, Pharm.D., BCPS AQ-ID []  , Pharm.D., BCPS []  Celedonio Miyamoto, Pharm.D., BCPS []  [place], Garvin Fila.D., BCPS, AAHIVP []  , Pharm.D., BCPS, AAHIVP []  Georgina Pillion, PharmD, BCPS []  , PharmD, BCPS []  [place], PharmD, BCPS []  [place], PharmD PharmD  Positive urine and blood culture  []  Patient discharged without antimicrobial prescription and treatment is now indicated []  Organism is resistant to prescribed ED discharge antimicrobial []  Patient with positive blood cultures  Changes discussed with ED provider: Estella Husk MD New antibiotic prescription needs Cefadroxil 1 gram po bid x 14 capsules, did not pick up rx for cefadroxil 500mg  po bid x14 capsules rx in the ED  Continuing to attempt to contact patient   04/30/2020, 11:59 AM

## 2020-04-30 NOTE — Progress Notes (Signed)
ED Antimicrobial Stewardship Positive Culture Follow Up   Judy Ballard is an 19 y.o. female who [facility] with a chief complaint of  Chief Complaint  Patient presents with  . Fever  . Emesis    Recent Results (from the past 720 hour(s))  Culture, OB Urine     Status: Abnormal   Collection Time: 04/18/20 11:20 AM   Specimen: OB Clean Catch; Urine  Result Value Ref Range Status   Specimen Description OB CLEAN CATCH  Final   Special Requests   Final    Immunocompromised Performed at [facility], [address] [place], [place] [id number]    Culture >=100,000 COLONIES/mL ESCHERICHIA COLI (A)  Final   Report Status 04/21/2020 FINAL  Final   Organism ID, Bacteria ESCHERICHIA COLI (A)  Final      Susceptibility   Escherichia coli - MIC*    AMPICILLIN 4 SENSITIVE Sensitive     CEFAZOLIN <=4 SENSITIVE Sensitive     CEFEPIME <=0.12 SENSITIVE Sensitive     CEFTAZIDIME <=1 SENSITIVE Sensitive     CEFTRIAXONE <=0.25 SENSITIVE Sensitive     CIPROFLOXACIN <=0.25 SENSITIVE Sensitive     GENTAMICIN <=1 SENSITIVE Sensitive     IMIPENEM <=0.25 SENSITIVE Sensitive     TRIMETH/SULFA <=20 SENSITIVE Sensitive     AMPICILLIN/SULBACTAM <=2 SENSITIVE Sensitive     PIP/TAZO <=4 SENSITIVE Sensitive     * >=100,000 COLONIES/mL ESCHERICHIA COLI  Respiratory Panel by RT PCR (Flu A&B, Covid) - Nasopharyngeal Swab     Status: None   Collection Time: 04/22/20 11:05 PM   Specimen: Nasopharyngeal Swab  Result Value Ref Range Status   SARS Coronavirus 2 by RT PCR NEGATIVE NEGATIVE Final    Comment: (NOTE) SARS-CoV-2 target nucleic acids are NOT DETECTED.  The SARS-CoV-2 RNA is generally detectable in upper respiratoy specimens during the acute phase of infection. The lowest concentration of SARS-CoV-2 viral copies this assay can detect is 131 copies/mL. A negative result does not preclude SARS-Cov-2 infection and should not be used as the sole basis for treatment  or other patient management decisions. A negative result may occur with  improper specimen collection/handling, submission of specimen other than nasopharyngeal swab, presence of viral mutation(s) within the areas targeted by this assay, and inadequate number of viral copies (<131 copies/mL). A negative result must be combined with clinical observations, patient history, and epidemiological information. The expected result is Negative.  Fact Sheet for Patients:  https://www.moore.com/https://www.fda.gov/media/142436/download  Fact Sheet for Healthcare Providers:  https://www.young.biz/https://www.fda.gov/media/142435/download  This test is no t yet approved or cleared by the [place] FDA and  has been authorized for detection and/or diagnosis of SARS-CoV-2 by FDA under an Emergency Use Authorization (EUA). This EUA will remain  in effect (meaning this test can be used) for the duration of the COVID-19 declaration under Section 564(b)(1) of the Act, 21 U.S.C. section 360bbb-3(b)(1), unless the authorization is terminated or revoked sooner.     Influenza A by PCR NEGATIVE NEGATIVE Final   Influenza B by PCR NEGATIVE NEGATIVE Final    Comment: (NOTE) The Xpert Xpress SARS-CoV-2/FLU/RSV assay is intended as an aid in  the diagnosis of influenza from Nasopharyngeal swab specimens and  should not be used as a sole basis for treatment. Nasal washings and  aspirates are unacceptable for Xpert Xpress SARS-CoV-2/FLU/RSV  testing.  Fact Sheet for Patients: https://www.moore.com/https://www.fda.gov/media/142436/download  Fact Sheet for Healthcare Providers: https://www.young.biz/https://www.fda.gov/media/142435/download  This test is not yet approved or  cleared by the [place] and  has been authorized for detection and/or diagnosis of SARS-CoV-2 by  FDA under an Emergency Use Authorization (EUA). This EUA will remain  in effect (meaning this test can be used) for the duration of the  Covid-19 declaration under Section 564(b)(1) of the Act, 21  U.S.C.  section 360bbb-3(b)(1), unless the authorization is  terminated or revoked. Performed at [facility], [address], [place], [place] [address]   Culture, blood (Routine x 2)     Status: Abnormal   Collection Time: 04/22/20 11:05 PM   Specimen: BLOOD  Result Value Ref Range Status   Specimen Description   Final    BLOOD RIGHT ANTECUBITAL Performed at [facility], [address], [place], [place] [address]    Special Requests   Final    BOTTLES DRAWN AEROBIC AND ANAEROBIC Blood Culture adequate volume Performed at [facility], [address], [place], [place] [address]    Culture  Setup Time   Final    GRAM NEGATIVE RODS ANAEROBIC BOTTLE ONLY CRITICAL RESULT CALLED TO, READ BACK BY AND VERIFIED WITH: PHARMD CINY R. [id number] FCP Performed at [facility], [address], [place], [place] [address]    Culture ESCHERICHIA COLI (A)  Final   Report Status 04/29/2020 FINAL  Final   Organism ID, Bacteria ESCHERICHIA COLI  Final      Susceptibility   Escherichia coli - MIC*    AMPICILLIN 4 SENSITIVE Sensitive     CEFAZOLIN <=4 SENSITIVE Sensitive     CEFEPIME <=0.12 SENSITIVE Sensitive     CEFTAZIDIME <=1 SENSITIVE Sensitive     CEFTRIAXONE <=0.25 SENSITIVE Sensitive     CIPROFLOXACIN <=0.25 SENSITIVE Sensitive     GENTAMICIN <=1 SENSITIVE Sensitive     IMIPENEM <=0.25 SENSITIVE Sensitive     TRIMETH/SULFA <=20 SENSITIVE Sensitive     AMPICILLIN/SULBACTAM <=2 SENSITIVE Sensitive     PIP/TAZO <=4 SENSITIVE Sensitive     * ESCHERICHIA COLI  Culture, blood (Routine x 2)     Status: None   Collection Time: 04/22/20 11:05 PM   Specimen: BLOOD  Result Value Ref Range Status   Specimen Description   Final    BLOOD BLOOD LEFT HAND Performed at [facility], [address], [place], [place] [address]    Special Requests   Final    BOTTLES DRAWN AEROBIC AND ANAEROBIC Blood Culture adequate  volume Performed at [facility], [address], [place], [place] [address]    Culture   Final    NO GROWTH 5 DAYS Performed at [facility], [address] [place], [place] [address]    Report Status 04/28/2020 FINAL  Final  Blood Culture ID Panel (Reflexed)     Status: Abnormal   Collection Time: 04/22/20 11:05 PM  Result Value Ref Range Status   Enterococcus faecalis NOT DETECTED NOT DETECTED Final   Enterococcus Faecium NOT DETECTED NOT DETECTED Final   Listeria monocytogenes NOT DETECTED NOT DETECTED Final   Staphylococcus species NOT DETECTED NOT DETECTED Final   Staphylococcus aureus (BCID) NOT DETECTED NOT DETECTED Final   Staphylococcus epidermidis NOT DETECTED NOT DETECTED Final   Staphylococcus lugdunensis NOT DETECTED NOT DETECTED Final   Streptococcus species NOT DETECTED NOT DETECTED Final   Streptococcus agalactiae NOT DETECTED NOT DETECTED Final   Streptococcus pneumoniae NOT DETECTED NOT DETECTED Final   Streptococcus pyogenes NOT DETECTED NOT DETECTED Final  A.calcoaceticus-baumannii NOT DETECTED NOT DETECTED Final   Bacteroides fragilis NOT DETECTED NOT DETECTED Final   Enterobacterales DETECTED (A) NOT DETECTED Final    Comment: Enterobacterales represent a large order of gram negative bacteria, not a single organism. CRITICAL RESULT CALLED TO, READ BACK BY AND VERIFIED WITH: PHARMD CINY R. [id number] FCP    Enterobacter cloacae complex NOT DETECTED NOT DETECTED Final   Escherichia coli DETECTED (A) NOT DETECTED Final    Comment: CRITICAL RESULT CALLED TO, READ BACK BY AND VERIFIED WITH: PHARMD CINY R. [id number] FCP    Klebsiella aerogenes NOT DETECTED NOT DETECTED Final   Klebsiella oxytoca NOT DETECTED NOT DETECTED Final   Klebsiella pneumoniae NOT DETECTED NOT DETECTED Final   Proteus species NOT DETECTED NOT DETECTED Final   Salmonella species NOT DETECTED NOT DETECTED Final   Serratia marcescens NOT DETECTED NOT DETECTED  Final   Haemophilus influenzae NOT DETECTED NOT DETECTED Final   Neisseria meningitidis NOT DETECTED NOT DETECTED Final   Pseudomonas aeruginosa NOT DETECTED NOT DETECTED Final   Stenotrophomonas maltophilia NOT DETECTED NOT DETECTED Final   Candida albicans NOT DETECTED NOT DETECTED Final   Candida auris NOT DETECTED NOT DETECTED Final   Candida glabrata NOT DETECTED NOT DETECTED Final   Candida krusei NOT DETECTED NOT DETECTED Final   Candida parapsilosis NOT DETECTED NOT DETECTED Final   Candida tropicalis NOT DETECTED NOT DETECTED Final   Cryptococcus neoformans/gattii NOT DETECTED NOT DETECTED Final   CTX-M ESBL NOT DETECTED NOT DETECTED Final   Carbapenem resistance IMP NOT DETECTED NOT DETECTED Final   Carbapenem resistance KPC NOT DETECTED NOT DETECTED Final   Carbapenem resistance NDM NOT DETECTED NOT DETECTED Final   Carbapenem resist OXA 48 LIKE NOT DETECTED NOT DETECTED Final   Carbapenem resistance VIM NOT DETECTED NOT DETECTED Final    Comment: Performed at [facility], [address] [place], [place] [address]  Urine culture     Status: Abnormal   Collection Time: 04/23/20  4:01 AM   Specimen: Urine, Clean Catch  Result Value Ref Range Status   Specimen Description URINE, CLEAN CATCH  Final   Special Requests NONE  Final   Culture >=100,000 COLONIES/mL ESCHERICHIA COLI (A)  Final   Report Status 04/24/2020 FINAL  Final   Organism ID, Bacteria ESCHERICHIA COLI (A)  Final      Susceptibility   Escherichia coli - MIC*    AMPICILLIN 4 SENSITIVE Sensitive     CEFAZOLIN <=4 SENSITIVE Sensitive     CEFTRIAXONE <=0.25 SENSITIVE Sensitive     CIPROFLOXACIN <=0.25 SENSITIVE Sensitive     GENTAMICIN <=1 SENSITIVE Sensitive     IMIPENEM <=0.25 SENSITIVE Sensitive     NITROFURANTOIN <=16 SENSITIVE Sensitive     TRIMETH/SULFA <=20 SENSITIVE Sensitive     AMPICILLIN/SULBACTAM <=2 SENSITIVE Sensitive     PIP/TAZO <=4 SENSITIVE Sensitive     * >=100,000 COLONIES/mL  ESCHERICHIA COLI    []  Treated with ---, organism resistant to prescribed antimicrobial []  Patient discharged originally without antimicrobial agent and treatment is now indicated   Patient prescribed Cefadroxil 500mg  PO BID x7 days.  Appropriate for UTI treatment, but for bacteremia, would increase to 1g BID dosing.  Patient has NOT picked up her cefadroxil prescription from the pharmacy where it was called in.  Transitions of care team and the [facility] ED have not been able to reach the patient.  New antibiotic prescription: Cefadroxil 1g PO BID x7 days  ED  Provider: Edythe Clarity PharmD, BCPS Clinical Pharmacist [facility] main pharmacy [phone] 04/30/2020 11:35 AM

## 2020-05-07 ENCOUNTER — Inpatient Hospital Stay (HOSPITAL_COMMUNITY)
Admission: AD | Admit: 2020-05-07 | Discharge: 2020-05-08 | Disposition: A | Discharge status: Home / Self Care | Payer: Medicaid Other | Attending: Obstetrics and Gynecology | Admitting: Obstetrics and Gynecology

## 2020-05-07 ENCOUNTER — Encounter (HOSPITAL_COMMUNITY): Payer: Self-pay | Admitting: Obstetrics and Gynecology

## 2020-05-07 ENCOUNTER — Other Ambulatory Visit: Payer: Self-pay

## 2020-05-07 DIAGNOSIS — R109 Unspecified abdominal pain: Secondary | ICD-10-CM | POA: Diagnosis present

## 2020-05-07 DIAGNOSIS — O2342 Unspecified infection of urinary tract in pregnancy, second trimester: Secondary | ICD-10-CM | POA: Diagnosis not present

## 2020-05-07 DIAGNOSIS — Z79899 Other long term (current) drug therapy: Secondary | ICD-10-CM | POA: Insufficient documentation

## 2020-05-07 DIAGNOSIS — Z3A23 23 weeks gestation of pregnancy: Secondary | ICD-10-CM | POA: Diagnosis not present

## 2020-05-07 DIAGNOSIS — R7881 Bacteremia: Secondary | ICD-10-CM

## 2020-05-07 DIAGNOSIS — T361X6A Underdosing of cephalosporins and other beta-lactam antibiotics, initial encounter: Secondary | ICD-10-CM | POA: Insufficient documentation

## 2020-05-07 DIAGNOSIS — Z792 Long term (current) use of antibiotics: Secondary | ICD-10-CM | POA: Diagnosis not present

## 2020-05-07 LAB — URINALYSIS, ROUTINE W REFLEX MICROSCOPIC
Bilirubin Urine: NEGATIVE
Glucose, UA: NEGATIVE mg/dL
Hgb urine dipstick: NEGATIVE
Ketones, ur: NEGATIVE mg/dL
Nitrite: POSITIVE — AB
Protein, ur: 30 mg/dL — AB
Specific Gravity, Urine: 1.013 (ref 1.005–1.030)
WBC, UA: >50 WBC/hpf — ABNORMAL HIGH (ref 0–5)
pH: 6 (ref 5.0–8.0)

## 2020-05-07 NOTE — MAU Provider Note (Signed)
Chief Complaint:  Abdominal Pain   First Provider Initiated Contact with Patient 05/07/20 2353     HPI: Judy Ballard is a 19 y.o. G1P0 at 22w5dwho presents to maternity admissions reporting pelvic pain all day . Was seen in ED recently (2 weeks ago) for UTI/pyelonephritis.  Urine culture grew E. Coli as did her blood culture.  She was not hospitalized.  She was treated with PO Cefadroxil which she is still taking (given 7 day course, did not take consistently).  She reports good fetal movement, denies LOF, vaginal bleeding, vaginal itching/burning, h/a, dizziness, n/v, diarrhea, constipation or fever/chills.  .  Abdominal Pain This is a recurrent problem. The current episode started today. The problem occurs constantly. The problem has been unchanged. The quality of the pain is cramping and dull. The abdominal pain radiates to the back. Pertinent negatives include no constipation, diarrhea, dysuria, fever, frequency, myalgias, nausea or vomiting. Nothing aggravates the pain. The pain is relieved by nothing. She has tried nothing for the symptoms.    RN Note: Judy Ballard is a 19 y.o. at [redacted]w[redacted]d here in MAU reporting: she has had lower abdominal cramping all day. Denies any VB or abnormal discharge Onset of complaint: today Pain score: 8  Past Medical History: Past Medical History:  Diagnosis Date  . Tonsillitis     Past obstetric history: OB History  Gravida Para Term Preterm AB Living  1            SAB TAB Ectopic Multiple Live Births               # Outcome Date GA Lbr Len/2nd Weight Sex Delivery Anes PTL Lv  1 Current             Past Surgical History: Past Surgical History:  Procedure Laterality Date  . TONSILLECTOMY Bilateral 07/16/2019   Procedure: TONSILLECTOMY;  Surgeon: Serena Colonel, MD;  Location: [facility];  Service: ENT;  Laterality: Bilateral;  . UMBILICAL HERNIA REPAIR      Family History: History reviewed. No pertinent family  history.  Social History: Social History   Tobacco Use  . Smoking status: Never Smoker  . Smokeless tobacco: Never Used  Substance Use Topics  . Alcohol use: No  . Drug use: Not Currently    Allergies:  Allergies  Allergen Reactions  . Apple Hives    Swollen lips  . Fish Allergy Hives    tilapia     Meds:  Medications Prior to Admission  Medication Sig Dispense Refill Last Dose  . cefadroxil (DURICEF) 500 MG capsule Take 1 capsule (500 mg total) by mouth 2 (two) times daily. 14 capsule 0 05/06/2020 at Unknown time  . famotidine (PEPCID) 10 MG tablet Take 10 mg by mouth daily.   05/07/2020 at Unknown time  . metoCLOPramide (REGLAN) 10 MG tablet Take 1 tablet (10 mg total) by mouth every 6 (six) hours as needed for nausea or vomiting. 30 tablet 0 05/07/2020 at Unknown time  . Prenatal Vit-Fe Fumarate-FA (PRENATAL MULTIVITAMIN) TABS tablet Take 1 tablet by mouth daily at 12 noon.   05/07/2020 at Unknown time  . cyclobenzaprine (FLEXERIL) 10 MG tablet Take 1 tablet (10 mg total) by mouth 2 (two) times daily as needed for muscle spasms. 20 tablet 0   . ondansetron (ZOFRAN-ODT) 8 MG disintegrating tablet Take 8 mg by mouth 3 (three) times daily as needed for nausea.      . potassium chloride SA (KLOR-CON) 20 MEQ  tablet Take 1 tablet (20 mEq total) by mouth 2 (two) times daily. 10 tablet 0   . promethazine (PHENERGAN) 25 MG tablet Take 25 mg by mouth daily as needed for nausea.        I have reviewed patient's Past Medical Hx, Surgical Hx, Family Hx, Social Hx, medications and allergies.   ROS:  Review of Systems  Constitutional: Negative for fever.  Gastrointestinal: Positive for abdominal pain. Negative for constipation, diarrhea, nausea and vomiting.  Genitourinary: Negative for dysuria and frequency.  Musculoskeletal: Negative for myalgias.   Other systems negative  Physical Exam   Patient Vitals for the past 24 hrs:  BP Temp Pulse Resp SpO2 Weight  05/07/20 1839  128/78 98.3 F (36.8 C) (!) 108 20 100 % 78.9 kg   Constitutional: Well-developed, well-nourished female in no acute distress.  Cardiovascular: normal rate and rhythm Respiratory: normal effort, clear to auscultation bilaterally GI: Abd soft, non-tender, gravid appropriate for gestational age.   No rebound or guarding. MS: Extremities nontender, no edema, normal ROM Neurologic: Alert and oriented x 4.  GU: Neg CVAT.  PELVIC EXAM: cervix long and closed.   FHT:  Baseline 140 , moderate variability, small accelerations present, no decelerations Contractions: Intermittent small cramps   Labs: Results for orders placed or performed during the hospital encounter of 05/07/20 (from the past 24 hour(s))  Urinalysis, Routine w reflex microscopic Urine, Clean Catch     Status: Abnormal   Collection Time: 05/07/20  7:03 PM  Result Value Ref Range   Color, Urine AMBER (A) YELLOW   APPearance HAZY (A) CLEAR   Specific Gravity, Urine 1.013 1.005 - 1.030   pH 6.0 5.0 - 8.0   Glucose, UA NEGATIVE NEGATIVE mg/dL   Hgb urine dipstick NEGATIVE NEGATIVE   Bilirubin Urine NEGATIVE NEGATIVE   Ketones, ur NEGATIVE NEGATIVE mg/dL   Protein, ur 30 (A) NEGATIVE mg/dL   Nitrite POSITIVE (A) NEGATIVE   Leukocytes,Ua LARGE (A) NEGATIVE   RBC / HPF 6-10 0 - 5 RBC/hpf   WBC, UA >50 (H) 0 - 5 WBC/hpf   Bacteria, UA MANY (A) NONE SEEN   Squamous Epithelial / LPF 0-5 0 - 5   Mucus PRESENT   Lactic acid, plasma     Status: None   Collection Time: 05/08/20 12:56 AM  Result Value Ref Range   Lactic Acid, Venous 0.9 0.5 - 1.9 mmol/L  CBC     Status: Abnormal   Collection Time: 05/08/20 12:58 AM  Result Value Ref Range   WBC 9.5 4.0 - 10.5 K/uL   RBC 3.41 (L) 3.87 - 5.11 MIL/uL   Hemoglobin 10.0 (L) 12.0 - 15.0 g/dL   HCT 74.0 (L) 36 - 46 %   MCV 89.7 80.0 - 100.0 fL   MCH 29.3 26.0 - 34.0 pg   MCHC 32.7 30.0 - 36.0 g/dL   RDW 81.4 48.1 - 85.6 %   Platelets 324 150 - 400 K/uL   nRBC 0.0 0.0 - 0.2 %   Comprehensive metabolic panel     Status: Abnormal   Collection Time: 05/08/20 12:58 AM  Result Value Ref Range   Sodium 135 135 - 145 mmol/L   Potassium 4.1 3.5 - 5.1 mmol/L   Chloride 103 98 - 111 mmol/L   CO2 22 22 - 32 mmol/L   Glucose, Bld 80 70 - 99 mg/dL   BUN <5 (L) 6 - 20 mg/dL   Creatinine, Ser 3.14 0.44 - 1.00 mg/dL  Calcium 9.4 8.9 - 10.3 mg/dL   Total Protein 6.5 6.5 - 8.1 g/dL   Albumin 2.9 (L) 3.5 - 5.0 g/dL   AST 32 15 - 41 U/L   ALT 29 0 - 44 U/L   Alkaline Phosphatase 101 38 - 126 U/L   Total Bilirubin 1.0 0.3 - 1.2 mg/dL   GFR, Estimated >90 >24 mL/min   Anion gap 10 5 - 15    Imaging:    MAU Course/MDM: I have ordered labs and reviewed results. [place] still shows evidence of infection.  No leukocytosis or fever, so doubt sepsis. NST reviewed, reassuring for gestational age Consult Dr Shawnie Pons with presentation, exam findings and test results.  Treatments in MAU included IV Rocephin x 2gms, then will send home on Septra DS tor complete coverage of e. Coli per consult Dr Shawnie Pons. .    Assessment: Single IUP at [redacted]w[redacted]d Urinary Tract Infection Positive Blood cultures  Plan: Discharge home Rx Septra DS for coverage of UTI organisms Preterm Labor precautions and fetal kick counts Follow up in Office for prenatal visits and recheck of status  Encouraged to return here or to other Urgent Care/ED if she develops worsening of symptoms, increase in pain, fever, or other concerning symptoms.   Pt stable at time of discharge.  Wynelle Bourgeois CNM, MSN Certified Nurse-Midwife 05/07/2020 11:53 PM

## 2020-05-07 NOTE — MAU Note (Signed)
.   Judy Ballard is a 19 y.o. at [redacted]w[redacted]d here in MAU reporting: she has had lower abdominal cramping all day. Denies any VB or abnormal discharge Onset of complaint: today Pain score: 8 Vitals:   05/07/20 1839  BP: 128/78  Pulse: (!) 108  Resp: 20  Temp: 98.3 F (36.8 C)  SpO2: 100%     FHT:154 Lab orders placed from triage: UA

## 2020-05-08 LAB — COMPREHENSIVE METABOLIC PANEL
ALT: 29 U/L (ref 0–44)
AST: 32 U/L (ref 15–41)
Albumin: 2.9 g/dL — ABNORMAL LOW (ref 3.5–5.0)
Alkaline Phosphatase: 101 U/L (ref 38–126)
Anion gap: 10 (ref 5–15)
BUN: <5 mg/dL — ABNORMAL LOW (ref 6–20)
CO2: 22 mmol/L (ref 22–32)
Calcium: 9.4 mg/dL (ref 8.9–10.3)
Chloride: 103 mmol/L (ref 98–111)
Creatinine, Ser: 0.58 mg/dL (ref 0.44–1.00)
GFR, Estimated: >60 mL/min (ref >60)
Glucose, Bld: 80 mg/dL (ref 70–99)
Potassium: 4.1 mmol/L (ref 3.5–5.1)
Sodium: 135 mmol/L (ref 135–145)
Total Bilirubin: 1 mg/dL (ref 0.3–1.2)
Total Protein: 6.5 g/dL (ref 6.5–8.1)

## 2020-05-08 LAB — CBC
HCT: 30.6 % — ABNORMAL LOW (ref 36.0–46.0)
Hemoglobin: 10 g/dL — ABNORMAL LOW (ref 12.0–15.0)
MCH: 29.3 pg (ref 26.0–34.0)
MCHC: 32.7 g/dL (ref 30.0–36.0)
MCV: 89.7 fL (ref 80.0–100.0)
Platelets: 324 10*3/uL (ref 150–400)
RBC: 3.41 MIL/uL — ABNORMAL LOW (ref 3.87–5.11)
RDW: 12.9 % (ref 11.5–15.5)
WBC: 9.5 10*3/uL (ref 4.0–10.5)
nRBC: 0 % (ref 0.0–0.2)

## 2020-05-08 LAB — LACTIC ACID, PLASMA: Lactic Acid, Venous: 0.9 mmol/L (ref 0.5–1.9)

## 2020-05-08 MED ORDER — SULFAMETHOXAZOLE-TRIMETHOPRIM 800-160 MG PO TABS: 1.0000 | ORAL_TABLET | Freq: Two times a day (BID) | ORAL | 0 refills | Status: DC

## 2020-05-08 MED ORDER — SODIUM CHLORIDE 0.9 % IV SOLN: INTRAVENOUS | Status: DC

## 2020-05-08 MED ORDER — SODIUM CHLORIDE 0.9 % IV SOLN
2.0000 g | Freq: Once | INTRAVENOUS | Status: AC
  Administered 2020-05-08: 2 g via INTRAVENOUS
  Filled 2020-05-08: qty 20

## 2020-05-08 MED ORDER — IBUPROFEN 600 MG PO TABS
600.0000 mg | ORAL_TABLET | Freq: Once | ORAL | Status: AC
  Administered 2020-05-08: 600 mg via ORAL
  Filled 2020-05-08: qty 1

## 2020-05-08 NOTE — Progress Notes (Signed)
OK to d/c EFM per Marie Williams CNM 

## 2020-05-08 NOTE — Progress Notes (Signed)
Wynelle Bourgeois CNM in earlier to discuss d/c plan. Written and verbal d/c instructions given and understanding voiced. To stop antibiotic pt was taking at home and begin new antibiotic sent to her pharmacy. To take as directed and finish all the medicine.

## 2020-05-08 NOTE — Discharge Instructions (Signed)
Pregnancy and Urinary Tract Infection ° °A urinary tract infection (UTI) is an infection of any part of the urinary tract. This includes the kidneys, the tubes that connect your kidneys to your bladder (ureters), the bladder, and the tube that carries urine out of your body (urethra). These organs make, store, and get rid of urine in the body. Your health care provider may use other names to describe the infection. An upper UTI affects the ureters and kidneys (pyelonephritis). A lower UTI affects the bladder (cystitis) and urethra (urethritis). °Most urinary tract infections are caused by bacteria in your genital area, around the entrance to your urinary tract (urethra). These bacteria grow and cause irritation and inflammation of your urinary tract. You are more likely to develop a UTI during pregnancy because the physical and hormonal changes your body goes through can make it easier for bacteria to get into your urinary tract. Your growing baby also puts pressure on your bladder and can affect urine flow. It is important to recognize and treat UTIs in pregnancy because of the risk of serious complications for both you and your baby. °How does this affect me? °Symptoms of a UTI include: °· Needing to urinate right away (urgently). °· Frequent urination or passing small amounts of urine frequently. °· Pain or burning with urination. °· Blood in the urine. °· Urine that smells bad or unusual. °· Trouble urinating. °· Cloudy urine. °· Pain in the abdomen or lower back. °· Vaginal discharge. °You may also have: °· Vomiting or a decreased appetite. °· Confusion. °· Irritability or tiredness. °· A fever. °· Diarrhea. °How does this affect my baby? °An untreated UTI during pregnancy could lead to a kidney infection or a systemic infection, which can cause health problems that could affect your baby. Possible complications of an untreated UTI include: °· Giving birth to your baby before 37 weeks of pregnancy  (premature). °· Having a baby with a low birth weight. °· Developing high blood pressure during pregnancy (preeclampsia). °· Having a low hemoglobin level (anemia). °What can I do to lower my risk? °To prevent a UTI: °· Go to the bathroom as soon as you feel the need. Do not hold urine for long periods of time. °· Always wipe from front to back, especially after a bowel movement. Use each tissue one time when you wipe. °· Empty your bladder after sex. °· Keep your genital area dry. °· Drink 6-10 glasses of water each day. °· Do not douche or use deodorant sprays. °How is this treated? °Treatment for this condition may include: °· Antibiotic medicines that are safe to take during pregnancy. °· Other medicines to treat less common causes of UTI. °Follow these instructions at home: °· If you were prescribed an antibiotic medicine, take it as told by your health care provider. Do not stop using the antibiotic even if you start to feel better. °· Keep all follow-up visits as told by your health care provider. This is important. °Contact a health care provider if: °· Your symptoms do not improve or they get worse. °· You have abnormal vaginal discharge. °Get help right away if you: °· Have a fever. °· Have nausea and vomiting. °· Have back or side pain. °· Feel contractions in your uterus. °· Have lower belly pain. °· Have a gush of fluid from your vagina. °· Have blood in your urine. °Summary °· A urinary tract infection (UTI) is an infection of any part of the urinary tract, which includes the   kidneys, ureters, bladder, and urethra. °· Most urinary tract infections are caused by bacteria in your genital area, around the entrance to your urinary tract (urethra). °· You are more likely to develop a UTI during pregnancy. °· If you were prescribed an antibiotic medicine, take it as told by your health care provider. Do not stop using the antibiotic even if you start to feel better. °This information is not intended to  replace advice given to you by your health care provider. Make sure you discuss any questions you have with your health care provider. °Document Revised: 11/03/2018 Document Reviewed: 06/15/2018 °Elsevier Patient Education © 2020 [employer] ° °

## 2020-05-09 LAB — CULTURE, OB URINE: Culture: >=100000 — AB

## 2020-05-30 DIAGNOSIS — Z3480 Encounter for supervision of other normal pregnancy, unspecified trimester: Secondary | ICD-10-CM | POA: Diagnosis not present

## 2020-05-30 DIAGNOSIS — O99019 Anemia complicating pregnancy, unspecified trimester: Secondary | ICD-10-CM | POA: Diagnosis not present

## 2020-07-03 ENCOUNTER — Other Ambulatory Visit: Payer: Self-pay

## 2020-07-03 ENCOUNTER — Inpatient Hospital Stay (HOSPITAL_COMMUNITY)
Admission: AD | Admit: 2020-07-03 | Discharge: 2020-07-03 | Disposition: A | Discharge status: Home / Self Care | Payer: Medicaid Other | Attending: Obstetrics and Gynecology | Admitting: Obstetrics and Gynecology

## 2020-07-03 ENCOUNTER — Encounter (HOSPITAL_COMMUNITY): Payer: Self-pay | Admitting: Obstetrics and Gynecology

## 2020-07-03 DIAGNOSIS — A599 Trichomoniasis, unspecified: Secondary | ICD-10-CM | POA: Insufficient documentation

## 2020-07-03 DIAGNOSIS — O4693 Antepartum hemorrhage, unspecified, third trimester: Secondary | ICD-10-CM | POA: Diagnosis not present

## 2020-07-03 DIAGNOSIS — O98313 Other infections with a predominantly sexual mode of transmission complicating pregnancy, third trimester: Secondary | ICD-10-CM | POA: Insufficient documentation

## 2020-07-03 DIAGNOSIS — N390 Urinary tract infection, site not specified: Secondary | ICD-10-CM | POA: Insufficient documentation

## 2020-07-03 DIAGNOSIS — O23593 Infection of other part of genital tract in pregnancy, third trimester: Secondary | ICD-10-CM

## 2020-07-03 DIAGNOSIS — Z792 Long term (current) use of antibiotics: Secondary | ICD-10-CM | POA: Insufficient documentation

## 2020-07-03 DIAGNOSIS — R102 Pelvic and perineal pain: Secondary | ICD-10-CM | POA: Diagnosis present

## 2020-07-03 DIAGNOSIS — O26893 Other specified pregnancy related conditions, third trimester: Secondary | ICD-10-CM | POA: Diagnosis not present

## 2020-07-03 DIAGNOSIS — O2343 Unspecified infection of urinary tract in pregnancy, third trimester: Secondary | ICD-10-CM | POA: Diagnosis not present

## 2020-07-03 DIAGNOSIS — A5901 Trichomonal vulvovaginitis: Secondary | ICD-10-CM

## 2020-07-03 DIAGNOSIS — Z3A31 31 weeks gestation of pregnancy: Secondary | ICD-10-CM | POA: Diagnosis not present

## 2020-07-03 DIAGNOSIS — Z79899 Other long term (current) drug therapy: Secondary | ICD-10-CM | POA: Insufficient documentation

## 2020-07-03 LAB — URINALYSIS, ROUTINE W REFLEX MICROSCOPIC
Bilirubin Urine: NEGATIVE
Glucose, UA: NEGATIVE mg/dL
Ketones, ur: NEGATIVE mg/dL
Nitrite: NEGATIVE
Protein, ur: NEGATIVE mg/dL
Specific Gravity, Urine: 1.005 (ref 1.005–1.030)
WBC, UA: >50 WBC/hpf — ABNORMAL HIGH (ref 0–5)
pH: 7 (ref 5.0–8.0)

## 2020-07-03 LAB — WET PREP, GENITAL
Clue Cells Wet Prep HPF POC: NONE SEEN
Sperm: NONE SEEN
Yeast Wet Prep HPF POC: NONE SEEN

## 2020-07-03 MED ORDER — METRONIDAZOLE 500 MG PO TABS
2000.0000 mg | ORAL_TABLET | Freq: Once | ORAL | Status: AC
  Administered 2020-07-03: 2000 mg via ORAL
  Filled 2020-07-03: qty 4

## 2020-07-03 NOTE — MAU Provider Note (Signed)
History     [id number]  Arrival date and time: 07/03/20 1438    No chief complaint on file.    HPI Judy Ballard is a 19 y.o. at [redacted]w[redacted]d by patient report, who presents for pelvic pain and vaginal bleeding.   Review of outside prenatal records from [facility] (in media tab): no comprehensive prenatal records available, lab results for CBC (hgb 9.9) and normal 1hr GTT/RPR only results present  Patient last seen in MAU October 13 At that time reporting pelvic pain in the setting of recently diagnosed UTI for which she has been intermittently taking antibiotics At that time urinalysis still consistent with urinary tract infection and spoke the urine culture eventually grew E. coli She was given IV Rocephin and sent home on Bactrim, culture showed sensitivity to symptoms antibiotics  Also seen in the [facility] on September 24 At that time with MSK chest pain resolved, as well as transient elevation in blood pressure that was thought to be spurious  Today reports she had some bleeding when she went to the bathroom this morning so Dr. Henderson Cloud directed her to the MAU Had some bright red blood when she wiped this morning No more bleeding after that but some pink discharge Last intercourse in three months ago No vaginal loss of fluid No contractions Normal fetal movement No other vaginal discharge No burning or pain with urination, finished antibiotic course from last MAU visit No abdominal pain     OB History    Gravida  1   Para      Term      Preterm      AB      Living        SAB      IAB      Ectopic      Multiple      Live Births              Past Medical History:  Diagnosis Date  . Tonsillitis     Past Surgical History:  Procedure Laterality Date  . TONSILLECTOMY Bilateral 07/16/2019   Procedure: TONSILLECTOMY;  Surgeon: Serena Colonel, MD;  Location: [facility];  Service: ENT;  Laterality: Bilateral;  . UMBILICAL  HERNIA REPAIR      History reviewed. No pertinent family history.  Social History   Socioeconomic History  . Marital status: Single    Spouse name: Not on file  . Number of children: Not on file  . Years of education: Not on file  . Highest education level: Not on file  Occupational History  . Not on file  Tobacco Use  . Smoking status: Never Smoker  . Smokeless tobacco: Never Used  Substance and Sexual Activity  . Alcohol use: No  . Drug use: Not Currently  . Sexual activity: Not Currently  Other Topics Concern  . Not on file  Social History Narrative  . Not on file   Social Determinants of Health   Financial Resource Strain: Not on file  Food Insecurity: Not on file  Transportation Needs: Not on file  Physical Activity: Not on file  Stress: Not on file  Social Connections: Not on file  Intimate Partner Violence: Not on file    Allergies  Allergen Reactions  . Apple Hives    Swollen lips  . Fish Allergy Hives    tilapia     No current facility-administered medications on file prior to encounter.   Current Outpatient Medications on  File Prior to Encounter  Medication Sig Dispense Refill  . Prenatal Vit-Fe Fumarate-FA (PRENATAL MULTIVITAMIN) TABS tablet Take 1 tablet by mouth daily at 12 noon.    . cefadroxil (DURICEF) 500 MG capsule Take 1 capsule (500 mg total) by mouth 2 (two) times daily. 14 capsule 0  . cyclobenzaprine (FLEXERIL) 10 MG tablet Take 1 tablet (10 mg total) by mouth 2 (two) times daily as needed for muscle spasms. 20 tablet 0  . famotidine (PEPCID) 10 MG tablet Take 10 mg by mouth daily.    . metoCLOPramide (REGLAN) 10 MG tablet Take 1 tablet (10 mg total) by mouth every 6 (six) hours as needed for nausea or vomiting. 30 tablet 0  . ondansetron (ZOFRAN-ODT) 8 MG disintegrating tablet Take 8 mg by mouth 3 (three) times daily as needed for nausea.     . promethazine (PHENERGAN) 25 MG tablet Take 25 mg by mouth daily as needed for nausea.     Marland Kitchen  sulfamethoxazole-trimethoprim (BACTRIM DS) 800-160 MG tablet Take 1 tablet by mouth 2 (two) times daily. 14 tablet 0     ROS Pertinent positives and negative per HPI, all others reviewed and negative  Physical Exam   BP 130/75 (BP Location: Right Arm)   Pulse 100   Temp 98.5 F (36.9 C) (Oral)   Resp 18   LMP 06/17/2019 (Exact Date)   Physical Exam Vitals reviewed.  Constitutional:      General: She is not in acute distress.    Appearance: She is well-developed and well-nourished. She is not diaphoretic.  Eyes:     General: No scleral icterus. Pulmonary:     Effort: Pulmonary effort is normal. No respiratory distress.  Abdominal:     General: There is no distension.     Palpations: Abdomen is soft.     Tenderness: There is no abdominal tenderness. There is no guarding or rebound.  Genitourinary:    Comments: Speculum exam shows mildly erythematous vaginal tissue, brownish discharge, no active source of bleeding, very friable appearing cervix Musculoskeletal:        General: No edema.  Skin:    General: Skin is warm and dry.  Neurological:     Mental Status: She is alert.     Coordination: Coordination normal.  Psychiatric:        Mood and Affect: Mood and affect normal.      Cervical Exam    Bedside Ultrasound Pt informed that the ultrasound is considered a limited OB ultrasound and is not intended to be a complete ultrasound exam.  Patient also informed that the ultrasound is not being completed with the intent of assessing for fetal or placental anomalies or any pelvic abnormalities.  Explained that the purpose of today's ultrasound is to assess for  placental location.  Patient acknowledges the purpose of the exam and the limitations of the study.      My interpretation: R posterior placenta  FHT Baseline 135, moderate variability, +accels, no decels Toco: none Cat: I  Labs Results for orders placed or performed during the hospital encounter of 07/03/20  (from the past 24 hour(s))  Wet prep, genital     Status: Abnormal   Collection Time: 07/03/20  3:46 PM   Specimen: Cervix  Result Value Ref Range   Yeast Wet Prep HPF POC NONE SEEN NONE SEEN   Trich, Wet Prep PRESENT (A) NONE SEEN   Clue Cells Wet Prep HPF POC NONE SEEN NONE SEEN   WBC, Wet Prep  HPF POC FEW (A) NONE SEEN   Sperm NONE SEEN     Imaging No results found.  MAU Course  Procedures  Lab Orders     Wet prep, genital     Urinalysis, Routine w reflex microscopic Meds ordered this encounter  Medications  . metroNIDAZOLE (FLAGYL) tablet 2,000 mg   Imaging Orders  No imaging studies ordered today    MDM moderate  Assessment and Plan  #Vaginal bleeding No obvious source besides friable cervix. No recent intercourse. Wet prep shows +trichomonas, GN/CT swab pending. Due to lack of prenatal records BSUS performed to assess placental location, R posterior w/o suspicion for previa. Overall suspect bleeding may be secondary to trichomonas and friable cervix. Given 2g Flagyl for treatment and EPT. Advised she will need TOC in 3-4 weeks.   #FWB FHT Cat I NST: Reactive  D/c to home in stable condition  Venora Maples

## 2020-07-03 NOTE — Discharge Instructions (Signed)
Trichomoniasis Trichomoniasis is an STI (sexually transmitted infection) that can affect both women and men. In women, the outer area of the female genitalia (vulva) and the vagina are affected. In men, mainly the penis is affected, but the prostate and other reproductive organs can also be involved.  This condition can be treated with medicine. It often has no symptoms (is asymptomatic), especially in men. If not treated, trichomoniasis can last for months or years. What are the causes? This condition is caused by a parasite called Trichomonas vaginalis. Trichomoniasis most often spreads from person to person (is contagious) through sexual contact. What increases the risk? The following factors may make you more likely to develop this condition:  Having unprotected sex.  Having sex with a partner who has trichomoniasis.  Having multiple sexual partners.  Having had previous trichomoniasis infections or other STIs. What are the signs or symptoms? In women, symptoms of trichomoniasis include:  Abnormal vaginal discharge that is clear, white, gray, or yellow-green and foamy and has an unusual "fishy" odor.  Itching and irritation of the vagina and vulva.  Burning or pain during urination or sex.  Redness and swelling of the genitals. In men, symptoms of trichomoniasis include:  Penile discharge that may be foamy or contain pus.  Pain in the penis. This may happen only when urinating.  Itching or irritation inside the penis.  Burning after urination or ejaculation. How is this diagnosed? In women, this condition may be found during a routine Pap test or physical exam. It may be found in men during a routine physical exam. Your health care provider may do tests to help diagnose this infection, such as:  Urine tests (men and women).  The following in women: ? Testing the pH of the vagina. ? A vaginal swab test that checks for the Trichomonas vaginalis parasite. ? Testing vaginal  secretions. Your health care provider may test you for other STIs, including HIV (human immunodeficiency virus). How is this treated? This condition is treated with medicine taken by mouth (orally), such as metronidazole or tinidazole, to fight the infection. Your sexual partner(s) also need to be tested and treated.  If you are a woman and you plan to become pregnant or think you may be pregnant, tell your health care provider right away. Some medicines that are used to treat the infection should not be taken during pregnancy. Your health care provider may recommend over-the-counter medicines or creams to help relieve itching or irritation. You may be tested for infection again 3 months after treatment. Follow these instructions at home:  Take and use over-the-counter and prescription medicines, including creams, only as told by your health care provider.  Take your antibiotic medicine as told by your health care provider. Do not stop taking the antibiotic even if you start to feel better.  Do not have sex until 7-10 days after you finish your medicine, or until your health care provider approves. Ask your health care provider when you may start to have sex again.  (Women) Do not douche or wear tampons while you have the infection.  Discuss your infection with your sexual partner(s). Make sure that your partner gets tested and treated, if necessary.  Keep all follow-up visits as told by your health care provider. This is important. How is this prevented?   Use condoms every time you have sex. Using condoms correctly and consistently can help protect against STIs.  Avoid having multiple sexual partners.  Talk with your sexual partner about any   symptoms that either of you may have, as well as any history of STIs.  Get tested for STIs and STDs (sexually transmitted diseases) before you have sex. Ask your partner to do the same.  Do not have sexual contact if you have symptoms of  trichomoniasis or another STI. Contact a health care provider if:  You still have symptoms after you finish your medicine.  You develop pain in your abdomen.  You have pain when you urinate.  You have bleeding after sex.  You develop a rash.  You feel nauseous or you vomit.  You plan to become pregnant or think you may be pregnant. Summary  Trichomoniasis is an STI (sexually transmitted infection) that can affect both women and men.  This condition often has no symptoms (is asymptomatic), especially in men.  Without treatment, this condition can last for months or years.  You should not have sex until 7-10 days after you finish your medicine, or until your health care provider approves. Ask your health care provider when you may start to have sex again.  Discuss your infection with your sexual partner(s). Make sure that your partner gets tested and treated, if necessary. This information is not intended to replace advice given to you by your health care provider. Make sure you discuss any questions you have with your health care provider. Document Revised: 04/25/2018 Document Reviewed: 04/25/2018 Elsevier Patient Education  2020 [employer]vier Inc[employer]  

## 2020-07-03 NOTE — MAU Note (Signed)
.   Judy Ballard is a 19 y.o. at [redacted]w[redacted]d here in MAU reporting: vaginal spotting that started yesterday. No recent intercourse. Reports good fetal movement. Denies LOF.   Pain score: 0 Vitals:   07/03/20 1500  BP: 130/75  Pulse: 100  Resp: 18  Temp: 98.5 F (36.9 C)     FHT:146 Lab orders placed from triage: UA

## 2020-07-04 LAB — GC/CHLAMYDIA PROBE AMP (~~LOC~~) NOT AT ARMC
Chlamydia: NEGATIVE
Comment: NEGATIVE
Comment: NORMAL
Neisseria Gonorrhea: NEGATIVE

## 2020-07-07 DIAGNOSIS — R319 Hematuria, unspecified: Secondary | ICD-10-CM | POA: Diagnosis not present

## 2020-07-13 ENCOUNTER — Other Ambulatory Visit: Payer: Self-pay

## 2020-07-13 ENCOUNTER — Inpatient Hospital Stay (HOSPITAL_COMMUNITY)
Admission: AD | Admit: 2020-07-13 | Discharge: 2020-07-13 | Disposition: A | Discharge status: Home / Self Care | Payer: Medicaid Other | Attending: Obstetrics and Gynecology | Admitting: Obstetrics and Gynecology

## 2020-07-13 ENCOUNTER — Encounter (HOSPITAL_COMMUNITY): Payer: Self-pay | Admitting: Obstetrics and Gynecology

## 2020-07-13 DIAGNOSIS — Z87440 Personal history of urinary (tract) infections: Secondary | ICD-10-CM | POA: Diagnosis not present

## 2020-07-13 DIAGNOSIS — Z79899 Other long term (current) drug therapy: Secondary | ICD-10-CM | POA: Diagnosis not present

## 2020-07-13 DIAGNOSIS — Z3A33 33 weeks gestation of pregnancy: Secondary | ICD-10-CM

## 2020-07-13 DIAGNOSIS — O4703 False labor before 37 completed weeks of gestation, third trimester: Secondary | ICD-10-CM

## 2020-07-13 DIAGNOSIS — O4693 Antepartum hemorrhage, unspecified, third trimester: Secondary | ICD-10-CM | POA: Diagnosis not present

## 2020-07-13 DIAGNOSIS — O479 False labor, unspecified: Secondary | ICD-10-CM

## 2020-07-13 HISTORY — DX: Urinary tract infection, site not specified: N39.0

## 2020-07-13 LAB — URINALYSIS, ROUTINE W REFLEX MICROSCOPIC
Bilirubin Urine: NEGATIVE
Glucose, UA: NEGATIVE mg/dL
Ketones, ur: NEGATIVE mg/dL
Nitrite: NEGATIVE
Protein, ur: NEGATIVE mg/dL
Specific Gravity, Urine: 1.005 (ref 1.005–1.030)
pH: 6 (ref 5.0–8.0)

## 2020-07-13 MED ORDER — SODIUM CHLORIDE 0.9 % IV SOLN
2.0000 g | Freq: Once | INTRAVENOUS | Status: AC
  Administered 2020-07-13: 2 g via INTRAVENOUS
  Filled 2020-07-13: qty 20

## 2020-07-13 MED ORDER — ACETAMINOPHEN 500 MG PO TABS
1000.0000 mg | ORAL_TABLET | Freq: Once | ORAL | Status: AC
  Administered 2020-07-13: 1000 mg via ORAL
  Filled 2020-07-13: qty 2

## 2020-07-13 MED ORDER — TERBUTALINE SULFATE 1 MG/ML IJ SOLN
0.2500 mg | Freq: Once | INTRAMUSCULAR | Status: AC
  Administered 2020-07-13: 0.25 mg via SUBCUTANEOUS
  Filled 2020-07-13: qty 1

## 2020-07-13 MED ORDER — METRONIDAZOLE 500 MG PO TABS
2000.0000 mg | ORAL_TABLET | Freq: Once | ORAL | Status: AC
  Administered 2020-07-13: 2000 mg via ORAL
  Filled 2020-07-13: qty 4

## 2020-07-13 MED ORDER — SODIUM CHLORIDE 0.9 % IV BOLUS
1000.0000 mL | Freq: Once | INTRAVENOUS | Status: AC
  Administered 2020-07-13: 1000 mL via INTRAVENOUS

## 2020-07-13 NOTE — Discharge Instructions (Signed)
Braxton Hicks Contractions °Contractions of the uterus can occur throughout pregnancy, but they are not always a sign that you are in labor. You may have practice contractions called Braxton Hicks contractions. These false labor contractions are sometimes confused with true labor. °What are Braxton Hicks contractions? °Braxton Hicks contractions are tightening movements that occur in the muscles of the uterus before labor. Unlike true labor contractions, these contractions do not result in opening (dilation) and thinning of the cervix. Toward the end of pregnancy (32-34 weeks), Braxton Hicks contractions can happen more often and may become stronger. These contractions are sometimes difficult to tell apart from true labor because they can be very uncomfortable. You should not feel embarrassed if you go to the hospital with false labor. °Sometimes, the only way to tell if you are in true labor is for your health care provider to look for changes in the cervix. The health care provider will do a physical exam and may monitor your contractions. If you are not in true labor, the exam should show that your cervix is not dilating and your water has not broken. °If there are no other health problems associated with your pregnancy, it is completely safe for you to be sent home with false labor. You may continue to have Braxton Hicks contractions until you go into true labor. °How to tell the difference between true labor and false labor °True labor °· Contractions last 30-70 seconds. °· Contractions become very regular. °· Discomfort is usually felt in the top of the uterus, and it spreads to the lower abdomen and low back. °· Contractions do not go away with walking. °· Contractions usually become more intense and increase in frequency. °· The cervix dilates and gets thinner. °False labor °· Contractions are usually shorter and not as strong as true labor contractions. °· Contractions are usually irregular. °· Contractions  are often felt in the front of the lower abdomen and in the groin. °· Contractions may go away when you walk around or change positions while lying down. °· Contractions get weaker and are shorter-lasting as time goes on. °· The cervix usually does not dilate or become thin. °Follow these instructions at home: ° °· Take over-the-counter and prescription medicines only as told by your health care provider. °· Keep up with your usual exercises and follow other instructions from your health care provider. °· Eat and drink lightly if you think you are going into labor. °· If Braxton Hicks contractions are making you uncomfortable: °? Change your position from lying down or resting to walking, or change from walking to resting. °? Sit and rest in a tub of warm water. °? Drink enough fluid to keep your urine pale yellow. Dehydration may cause these contractions. °? Do slow and deep breathing several times an hour. °· Keep all follow-up prenatal visits as told by your health care provider. This is important. °Contact a health care provider if: °· You have a fever. °· You have continuous pain in your abdomen. °Get help right away if: °· Your contractions become stronger, more regular, and closer together. °· You have fluid leaking or gushing from your vagina. °· You pass blood-tinged mucus (bloody show). °· You have bleeding from your vagina. °· You have low back pain that you never had before. °· You feel your baby’s head pushing down and causing pelvic pressure. °· Your baby is not moving inside you as much as it used to. °Summary °· Contractions that occur before labor are   called Braxton Hicks contractions, false labor, or practice contractions. °· Braxton Hicks contractions are usually shorter, weaker, farther apart, and less regular than true labor contractions. True labor contractions usually become progressively stronger and regular, and they become more frequent. °· Manage discomfort from Braxton Hicks contractions  by changing position, resting in a warm bath, drinking plenty of water, or practicing deep breathing. °This information is not intended to replace advice given to you by your health care provider. Make sure you discuss any questions you have with your health care provider. °Document Revised: 06/24/2017 Document Reviewed: 11/25/2016 °Elsevier Patient Education © 2020 Elsevier Inc. ° °

## 2020-07-13 NOTE — MAU Note (Addendum)
Patient presented to MAU at [redacted]w[redacted]d gestation with c/o hematuria or vaginal bleeding since 0400 today (patient unsure where the blood is coming from, only happens when she urinates). Patient reports having a UTI on 07/03/20.  Patient reports no bleeding on a pad or her underwear. Patient denies any LOF, no contractions, and patient feels fetal movement.

## 2020-07-13 NOTE — MAU Provider Note (Addendum)
Chief Complaint:  Hematuria and Vaginal Bleeding   Event Date/Time   First Provider Initiated Contact with Patient 07/13/20 1802      HPI: Judy Ballard is a 19 y.o. G1P0 at [redacted]w[redacted]d who presents to maternity admissions reporting bright red bleeding in the toilet and pink when wiping all day today. She reports some mild cramping associated with the bleeding. She was recently treated for trichomonas with Flagyl in MAU on 07/03/20 and her partner was treated at that time.  She was diagnosed with UTI on 10/13 in MAU and completed course of abx.  She was diagnosed again on 12/13 in the office and took 3 days of abx but has forgotten to take them for the last 2 days.  There are no other associated symptoms.    She reports good fetal movement.    Location: lower abdomen Quality: cramping Severity: 4/10 on pain scale Duration: 1 day Timing: intermittent Modifying factors: none Associated signs and symptoms: vaginal bleeding  HPI  Past Medical History: Past Medical History:  Diagnosis Date   Tonsillitis    UTI (urinary tract infection)     Past obstetric history: OB History  Gravida Para Term Preterm AB Living  1            SAB IAB Ectopic Multiple Live Births               # Outcome Date GA Lbr Len/2nd Weight Sex Delivery Anes PTL Lv  1 Current             Past Surgical History: Past Surgical History:  Procedure Laterality Date   TONSILLECTOMY Bilateral 07/16/2019   Procedure: TONSILLECTOMY;  Surgeon: Serena Colonel, MD;  Location: [facility];  Service: ENT;  Laterality: Bilateral;   UMBILICAL HERNIA REPAIR      Family History: History reviewed. No pertinent family history.  Social History: Social History   Tobacco Use   Smoking status: Never Smoker   Smokeless tobacco: Never Used  Building services engineer Use: Never used  Substance Use Topics   Alcohol use: No   Drug use: Not Currently    Allergies:  Allergies  Allergen Reactions   Apple  Hives    Swollen lips   Fish Allergy Hives    tilapia     Meds:  Medications Prior to Admission  Medication Sig Dispense Refill Last Dose   omeprazole (PRILOSEC) 20 MG capsule Take 20 mg by mouth daily.   07/12/2020 at Unknown time   Prenatal Vit-Fe Fumarate-FA (PRENATAL MULTIVITAMIN) TABS tablet Take 1 tablet by mouth daily at 12 noon.   07/12/2020 at Unknown time   sulfamethoxazole-trimethoprim (BACTRIM DS) 800-160 MG tablet Take 1 tablet by mouth every 12 (twelve) hours.   Past Week at Unknown time   cyclobenzaprine (FLEXERIL) 10 MG tablet Take 1 tablet (10 mg total) by mouth 2 (two) times daily as needed for muscle spasms. 20 tablet 0    famotidine (PEPCID) 10 MG tablet Take 10 mg by mouth daily.      metoCLOPramide (REGLAN) 10 MG tablet Take 1 tablet (10 mg total) by mouth every 6 (six) hours as needed for nausea or vomiting. 30 tablet 0    ondansetron (ZOFRAN-ODT) 8 MG disintegrating tablet Take 8 mg by mouth 3 (three) times daily as needed for nausea.       promethazine (PHENERGAN) 25 MG tablet Take 25 mg by mouth daily as needed for nausea.  ROS:  Review of Systems  Constitutional: Negative for chills, fatigue and fever.  Eyes: Negative for visual disturbance.  Respiratory: Negative for shortness of breath.   Cardiovascular: Negative for chest pain.  Gastrointestinal: Positive for abdominal pain. Negative for nausea and vomiting.  Genitourinary: Positive for vaginal bleeding. Negative for difficulty urinating, dysuria, flank pain, pelvic pain, vaginal discharge and vaginal pain.  Neurological: Negative for dizziness and headaches.  Psychiatric/Behavioral: Negative.      I have reviewed patient's Past Medical Hx, Surgical Hx, Family Hx, Social Hx, medications and allergies.   Physical Exam   Patient Vitals for the past 24 hrs:  BP Temp Temp src Pulse Resp SpO2 Height Weight  07/13/20 1801 125/76 -- -- (!) 101 18 100 % -- --  07/13/20 1735 -- -- -- -- --  100 % -- --  07/13/20 1731 130/82 98.4 F (36.9 C) Oral (!) 106 18 -- 5\' 3"  (1.6 m) 80.2 kg   Constitutional: Well-developed, well-nourished female in no acute distress.  Cardiovascular: normal rate Respiratory: normal effort GI: Abd soft, non-tender, gravid appropriate for gestational age.  MS: Extremities nontender, no edema, normal ROM Neurologic: Alert and oriented x 4.  GU: Neg CVAT.  PELVIC EXAM: Cervix pink, visually closed,  moderate amount white creamy discharge, no bleeding in vaginal vault but cervix friable to cotton swab,vaginal walls and external genitalia normal   Dilation: Closed Effacement (%): Thick Cervical Position: Posterior Station: Ballotable Exam by:: [email or web address] CNM  FHT:  Baseline 140 , moderate variability, accelerations present, no decelerations Contractions: q 2-6 mins, mild to palaption   Labs: Results for orders placed or performed during the hospital encounter of 07/13/20 (from the past 24 hour(s))  Urinalysis, Routine w reflex microscopic Urine, Clean Catch     Status: Abnormal   Collection Time: 07/13/20  5:58 PM  Result Value Ref Range   Color, Urine YELLOW YELLOW   APPearance HAZY (A) CLEAR   Specific Gravity, Urine 1.005 1.005 - 1.030   pH 6.0 5.0 - 8.0   Glucose, UA NEGATIVE NEGATIVE mg/dL   Hgb urine dipstick SMALL (A) NEGATIVE   Bilirubin Urine NEGATIVE NEGATIVE   Ketones, ur NEGATIVE NEGATIVE mg/dL   Protein, ur NEGATIVE NEGATIVE mg/dL   Nitrite NEGATIVE NEGATIVE   Leukocytes,Ua LARGE (A) NEGATIVE   RBC / HPF 0-5 0 - 5 RBC/hpf   WBC, UA 21-50 0 - 5 WBC/hpf   Bacteria, UA RARE (A) NONE SEEN   Squamous Epithelial / LPF 0-5 0 - 5   Mucus PRESENT       Imaging:  No results found.  MAU Course/MDM: Orders Placed This Encounter  Procedures   OB Urine Culture   Urinalysis, Routine w reflex microscopic Urine, Clean Catch    Meds ordered this encounter  Medications   sodium chloride 0.9 % bolus 1,000 mL    cefTRIAXone (ROCEPHIN) 2 g in sodium chloride 0.9 % 100 mL IVPB    Order Specific Question:   Antibiotic Indication:    Answer:   UTI   metroNIDAZOLE (FLAGYL) tablet 2,000 mg   terbutaline (BRETHINE) injection 0.25 mg   acetaminophen (TYLENOL) tablet 1,000 mg     NST reviewed and reactive Cervix closed/thick/high with no evidence of preterm labor but frequent mild contractions Pt did not complete recent tx for UTI and cervix remains friable after treatment for trichomonas so both retreated in MAU today with IV fluids and Rocephin 2 g IV plus Flagyl 2 g PO Contractions persist, cervix remains  closed Consult Dr Despina Hidden with presentation, exam findings and test results.  Terbutaline SQ dose and Tylenol 1000 mg PO ordered Report to Venia Carbon, NP  Patient feels significantly better after terbutaline and tylenol. She feels well enough to go home.  She is scheduled for f/u this week on 12/22   Sharen Counter Certified Nurse-Midwife 07/13/2020 9:24 PM    Preston Garabedian, Harolyn Rutherford, NP 07/13/2020 9:59 PM

## 2020-07-16 DIAGNOSIS — O99019 Anemia complicating pregnancy, unspecified trimester: Secondary | ICD-10-CM | POA: Diagnosis not present

## 2020-07-16 LAB — CULTURE, OB URINE

## 2020-07-26 NOTE — L&D Delivery Note (Signed)
Delivery Note At 4:28 PM a viable and healthy female was delivered via Vaginal, Spontaneous (Presentation: Left Occiput Anterior).  APGAR: 9, 9; weight  pending.   Placenta status: Spontaneous, Intact.  Cord: 3 vessels  Pt pushed for 16 minutes and delivered a vigorous female infant in the vertex LOA presentation over an intact perineum. The infant was passed to the maternal abdomen and after a 1 minute delay the cord was clamped and cut. The placenta delivered spontaneously, intact, with 3VC. No lacerations required repair. Mom and baby are doing well after delivery  Anesthesia: Epidural Episiotomy: None Lacerations: None Suture Repair: NA Est. Blood Loss (mL): 402  Mom to postpartum.  Baby to Couplet care / Skin to Skin.  Waynard Reeds 08/13/2020, 4:44 PM

## 2020-07-30 DIAGNOSIS — Z113 Encounter for screening for infections with a predominantly sexual mode of transmission: Secondary | ICD-10-CM | POA: Diagnosis not present

## 2020-08-04 ENCOUNTER — Inpatient Hospital Stay (HOSPITAL_COMMUNITY)
Admission: AD | Admit: 2020-08-04 | Discharge: 2020-08-04 | Disposition: A | Discharge status: Home / Self Care | Payer: Medicaid Other | Attending: Obstetrics and Gynecology | Admitting: Obstetrics and Gynecology

## 2020-08-04 ENCOUNTER — Encounter (HOSPITAL_COMMUNITY): Payer: Self-pay | Admitting: Obstetrics and Gynecology

## 2020-08-04 ENCOUNTER — Other Ambulatory Visit: Payer: Self-pay

## 2020-08-04 DIAGNOSIS — Z3A36 36 weeks gestation of pregnancy: Secondary | ICD-10-CM | POA: Diagnosis not present

## 2020-08-04 DIAGNOSIS — O4703 False labor before 37 completed weeks of gestation, third trimester: Secondary | ICD-10-CM

## 2020-08-04 DIAGNOSIS — O26893 Other specified pregnancy related conditions, third trimester: Secondary | ICD-10-CM | POA: Insufficient documentation

## 2020-08-04 DIAGNOSIS — Z79899 Other long term (current) drug therapy: Secondary | ICD-10-CM | POA: Insufficient documentation

## 2020-08-04 DIAGNOSIS — O99891 Other specified diseases and conditions complicating pregnancy: Secondary | ICD-10-CM | POA: Diagnosis not present

## 2020-08-04 DIAGNOSIS — R030 Elevated blood-pressure reading, without diagnosis of hypertension: Secondary | ICD-10-CM

## 2020-08-04 DIAGNOSIS — O479 False labor, unspecified: Secondary | ICD-10-CM

## 2020-08-04 LAB — CBC
HCT: 27.3 % — ABNORMAL LOW (ref 36.0–46.0)
Hemoglobin: 9.1 g/dL — ABNORMAL LOW (ref 12.0–15.0)
MCH: 28.4 pg (ref 26.0–34.0)
MCHC: 33.3 g/dL (ref 30.0–36.0)
MCV: 85.3 fL (ref 80.0–100.0)
Platelets: 393 10*3/uL (ref 150–400)
RBC: 3.2 MIL/uL — ABNORMAL LOW (ref 3.87–5.11)
RDW: 16.7 % — ABNORMAL HIGH (ref 11.5–15.5)
WBC: 11.5 10*3/uL — ABNORMAL HIGH (ref 4.0–10.5)
nRBC: 0 % (ref 0.0–0.2)

## 2020-08-04 LAB — COMPREHENSIVE METABOLIC PANEL
ALT: 10 U/L (ref 0–44)
AST: 23 U/L (ref 15–41)
Albumin: 2.4 g/dL — ABNORMAL LOW (ref 3.5–5.0)
Alkaline Phosphatase: 185 U/L — ABNORMAL HIGH (ref 38–126)
Anion gap: 12 (ref 5–15)
BUN: <5 mg/dL — ABNORMAL LOW (ref 6–20)
CO2: 19 mmol/L — ABNORMAL LOW (ref 22–32)
Calcium: 8.9 mg/dL (ref 8.9–10.3)
Chloride: 104 mmol/L (ref 98–111)
Creatinine, Ser: 0.75 mg/dL (ref 0.44–1.00)
GFR, Estimated: >60 mL/min (ref >60)
Glucose, Bld: 86 mg/dL (ref 70–99)
Potassium: 4.2 mmol/L (ref 3.5–5.1)
Sodium: 135 mmol/L (ref 135–145)
Total Bilirubin: 1 mg/dL (ref 0.3–1.2)
Total Protein: 6.7 g/dL (ref 6.5–8.1)

## 2020-08-04 NOTE — MAU Note (Signed)
.  Judy Ballard is a 20 y.o. at [redacted]w[redacted]d here in MAU reporting: Contractions since 1am and are now every 3-4 minutes. Reports good fetal movement. Denies leaking of fluid or vaginal bleeding.

## 2020-08-04 NOTE — MAU Provider Note (Addendum)
History     CSN: [id number]  Arrival date and time: 08/04/20 0335   Event Date/Time   First Provider Initiated Contact with Patient 08/04/20 [phone]      Chief Complaint  Patient presents with  . Contractions   Ms. JACKLINE CASTILLA is a 20 y.o. year old G1P0 female at [redacted]w[redacted]d weeks gestation who presents to MAU reporting UCs since 0100 that are now every 3-5 mins. She denies any VB or LOF. She reports no problems during pregnancy to date. She receives [facility] with [facility]; next appointment is 08/06/2020. Her mom is with her offering historical information and reports that she herself had gHTN with her last delivery 2 years ago.   OB History    Gravida  1   Para      Term      Preterm      AB      Living        SAB      IAB      Ectopic      Multiple      Live Births              Past Medical History:  Diagnosis Date  . Tonsillitis   . UTI (urinary tract infection)     Past Surgical History:  Procedure Laterality Date  . TONSILLECTOMY Bilateral 07/16/2019   Procedure: TONSILLECTOMY;  Surgeon: Serena Colonel, MD;  Location: [facility];  Service: ENT;  Laterality: Bilateral;  . UMBILICAL HERNIA REPAIR      History reviewed. No pertinent family history.  Social History   Tobacco Use  . Smoking status: Never Smoker  . Smokeless tobacco: Never Used  Vaping Use  . Vaping Use: Never used  Substance Use Topics  . Alcohol use: No  . Drug use: Not Currently    Allergies:  Allergies  Allergen Reactions  . Apple Hives    Swollen lips  . Fish Allergy Hives    tilapia     Medications Prior to Admission  Medication Sig Dispense Refill Last Dose  . omeprazole (PRILOSEC) 20 MG capsule Take 20 mg by mouth daily.   08/03/2020 at Unknown time  . Prenatal Vit-Fe Fumarate-FA (PRENATAL MULTIVITAMIN) TABS tablet Take 1 tablet by mouth daily at 12 noon.   Past Week at Unknown time  . cyclobenzaprine (FLEXERIL) 10 MG tablet Take 1 tablet (10 mg total) by  mouth 2 (two) times daily as needed for muscle spasms. 20 tablet 0   . famotidine (PEPCID) 10 MG tablet Take 10 mg by mouth daily.     . metoCLOPramide (REGLAN) 10 MG tablet Take 1 tablet (10 mg total) by mouth every 6 (six) hours as needed for nausea or vomiting. 30 tablet 0   . ondansetron (ZOFRAN-ODT) 8 MG disintegrating tablet Take 8 mg by mouth 3 (three) times daily as needed for nausea.      . promethazine (PHENERGAN) 25 MG tablet Take 25 mg by mouth daily as needed for nausea.        Review of Systems  Constitutional: Negative.   HENT: Negative.   Eyes: Negative.   Respiratory: Negative.   Cardiovascular: Negative.   Gastrointestinal: Negative.   Endocrine: Negative.   Genitourinary: Positive for pelvic pain (UC's since 0100; every 3-5 mins).  Musculoskeletal: Negative.   Skin: Negative.   Allergic/Immunologic: Negative.   Neurological: Negative.   Hematological: Negative.   Psychiatric/Behavioral: Negative.    Physical Exam   Patient Vitals for the  past 24 hrs:  BP Temp Temp src Pulse Resp SpO2 Height  08/04/20 0752 136/82 - - (!) 115 - - -  08/04/20 0745 137/88 - - (!) 108 - - -  08/04/20 0744 (!) 142/87 - - (!) 104 - - -  08/04/20 0505 - - - - - 100 % -  08/04/20 0412 127/79 - - (!) 118 - - -  08/04/20 0411 - 98.5 F (36.9 C) Oral (!) 113 20 100 % 5\' 3"  (1.6 m)     Physical Exam Vitals and nursing note reviewed. Exam conducted with a chaperone present.  Constitutional:      Appearance: Normal appearance. She is normal weight.  HENT:     Head: Normocephalic and atraumatic.  Cardiovascular:     Rate and Rhythm: Tachycardia present.  Pulmonary:     Effort: Pulmonary effort is normal.  Abdominal:     Palpations: Abdomen is soft.  Genitourinary:    Comments: Dilation: Closed Effacement (%): Thick Cervical Position: Middle Exam by: , RN  Cervix unchanged after 1.5 hours of oberservation Musculoskeletal:        General: No swelling. Normal range of  motion.  Skin:    General: Skin is warm and dry.  Neurological:     Mental Status: She is alert and oriented to person, place, and time.  Psychiatric:        Mood and Affect: Mood normal.        Behavior: Behavior normal.        Thought Content: Thought content normal.        Judgment: Judgment normal.    REACTIVE NST - FHR: 135 bpm / moderate variability / accels present / decels absent / TOCO: irregular UCs  MAU Course  Procedures  MDM Recheck cervix @ 0600 CCUA CBC CMP P/C Ratio Serial BP's     No results found for this or any previous visit (from the past 24 hour(s)).    Report given to and care assumed by Dr. Howell Rucks @ 0800. Labs pending collection and results.  Adrian Blackwater, CNM 08/04/2020, 7:55 AM Assessment and Plan      ICD-10-CM   1. False labor  O47.9 Discharge patient  2. Elevated BP without diagnosis of hypertension  R03.0   3. [redacted] weeks gestation of pregnancy  Z3A.36    Took over patient. Labs all normal. No severe symptoms. Discharge to home. Return with severe symptoms or labor.  10/02/2020, DO 08/04/2020

## 2020-08-06 DIAGNOSIS — Z113 Encounter for screening for infections with a predominantly sexual mode of transmission: Secondary | ICD-10-CM | POA: Diagnosis not present

## 2020-08-06 DIAGNOSIS — Z3480 Encounter for supervision of other normal pregnancy, unspecified trimester: Secondary | ICD-10-CM | POA: Diagnosis not present

## 2020-08-06 DIAGNOSIS — Z3403 Encounter for supervision of normal first pregnancy, third trimester: Secondary | ICD-10-CM | POA: Diagnosis not present

## 2020-08-06 DIAGNOSIS — Z23 Encounter for immunization: Secondary | ICD-10-CM | POA: Diagnosis not present

## 2020-08-12 ENCOUNTER — Other Ambulatory Visit: Payer: Self-pay | Admitting: Obstetrics and Gynecology

## 2020-08-13 ENCOUNTER — Inpatient Hospital Stay (HOSPITAL_COMMUNITY): Payer: Medicaid Other | Admitting: Anesthesiology

## 2020-08-13 ENCOUNTER — Inpatient Hospital Stay (HOSPITAL_COMMUNITY)
Admission: AD | Admit: 2020-08-13 | Discharge: 2020-08-15 | DRG: 807 | Disposition: A | Discharge status: Home / Self Care | Payer: Medicaid Other | Attending: Obstetrics and Gynecology | Admitting: Obstetrics and Gynecology

## 2020-08-13 ENCOUNTER — Other Ambulatory Visit: Payer: Self-pay

## 2020-08-13 ENCOUNTER — Encounter (HOSPITAL_COMMUNITY): Payer: Self-pay | Admitting: Obstetrics and Gynecology

## 2020-08-13 ENCOUNTER — Inpatient Hospital Stay (HOSPITAL_COMMUNITY): Payer: Medicaid Other

## 2020-08-13 DIAGNOSIS — O134 Gestational [pregnancy-induced] hypertension without significant proteinuria, complicating childbirth: Secondary | ICD-10-CM | POA: Diagnosis not present

## 2020-08-13 DIAGNOSIS — Z20822 Contact with and (suspected) exposure to covid-19: Secondary | ICD-10-CM | POA: Diagnosis present

## 2020-08-13 DIAGNOSIS — O164 Unspecified maternal hypertension, complicating childbirth: Secondary | ICD-10-CM | POA: Diagnosis not present

## 2020-08-13 DIAGNOSIS — Z3A37 37 weeks gestation of pregnancy: Secondary | ICD-10-CM

## 2020-08-13 DIAGNOSIS — Z8759 Personal history of other complications of pregnancy, childbirth and the puerperium: Secondary | ICD-10-CM | POA: Diagnosis present

## 2020-08-13 DIAGNOSIS — O9902 Anemia complicating childbirth: Secondary | ICD-10-CM | POA: Diagnosis present

## 2020-08-13 DIAGNOSIS — O139 Gestational [pregnancy-induced] hypertension without significant proteinuria, unspecified trimester: Secondary | ICD-10-CM | POA: Diagnosis not present

## 2020-08-13 DIAGNOSIS — Z23 Encounter for immunization: Secondary | ICD-10-CM | POA: Diagnosis not present

## 2020-08-13 DIAGNOSIS — Z87440 Personal history of urinary (tract) infections: Secondary | ICD-10-CM

## 2020-08-13 DIAGNOSIS — D573 Sickle-cell trait: Secondary | ICD-10-CM | POA: Diagnosis present

## 2020-08-13 DIAGNOSIS — D649 Anemia, unspecified: Secondary | ICD-10-CM | POA: Diagnosis not present

## 2020-08-13 LAB — TYPE AND SCREEN
ABO/RH(D): O POS
Antibody Screen: NEGATIVE

## 2020-08-13 LAB — CBC
HCT: 29 % — ABNORMAL LOW (ref 36.0–46.0)
HCT: 27.6 % — ABNORMAL LOW (ref 36.0–46.0)
Hemoglobin: 9.5 g/dL — ABNORMAL LOW (ref 12.0–15.0)
Hemoglobin: 9.1 g/dL — ABNORMAL LOW (ref 12.0–15.0)
MCH: 27.8 pg (ref 26.0–34.0)
MCH: 28.3 pg (ref 26.0–34.0)
MCHC: 32.8 g/dL (ref 30.0–36.0)
MCHC: 33 g/dL (ref 30.0–36.0)
MCV: 84.8 fL (ref 80.0–100.0)
MCV: 85.7 fL (ref 80.0–100.0)
Platelets: 441 10*3/uL — ABNORMAL HIGH (ref 150–400)
Platelets: 350 10*3/uL (ref 150–400)
RBC: 3.42 MIL/uL — ABNORMAL LOW (ref 3.87–5.11)
RBC: 3.22 MIL/uL — ABNORMAL LOW (ref 3.87–5.11)
RDW: 17.2 % — ABNORMAL HIGH (ref 11.5–15.5)
RDW: 16.6 % — ABNORMAL HIGH (ref 11.5–15.5)
WBC: 11 10*3/uL — ABNORMAL HIGH (ref 4.0–10.5)
WBC: 14.6 10*3/uL — ABNORMAL HIGH (ref 4.0–10.5)
nRBC: 0 % (ref 0.0–0.2)
nRBC: 0 % (ref 0.0–0.2)

## 2020-08-13 LAB — RESP PANEL BY RT-PCR (FLU A&B, COVID) ARPGX2
Influenza A by PCR: NEGATIVE
Influenza B by PCR: NEGATIVE
SARS Coronavirus 2 by RT PCR: NEGATIVE

## 2020-08-13 LAB — RPR: RPR Ser Ql: NONREACTIVE

## 2020-08-13 MED ORDER — ONDANSETRON HCL 4 MG PO TABS: 4.0000 mg | ORAL_TABLET | ORAL | Status: DC | PRN

## 2020-08-13 MED ORDER — OXYCODONE-ACETAMINOPHEN 5-325 MG PO TABS: 1.0000 | ORAL_TABLET | ORAL | Status: DC | PRN

## 2020-08-13 MED ORDER — ACETAMINOPHEN 325 MG PO TABS: 650.0000 mg | ORAL_TABLET | ORAL | Status: DC | PRN

## 2020-08-13 MED ORDER — TERBUTALINE SULFATE 1 MG/ML IJ SOLN: 0.2500 mg | Freq: Once | INTRAMUSCULAR | Status: DC | PRN

## 2020-08-13 MED ORDER — TETANUS-DIPHTH-ACELL PERTUSSIS 5-2.5-18.5 LF-MCG/0.5 IM SUSY: 0.5000 mL | PREFILLED_SYRINGE | Freq: Once | INTRAMUSCULAR | Status: DC

## 2020-08-13 MED ORDER — FENTANYL-BUPIVACAINE-NACL 0.5-0.125-0.9 MG/250ML-% EP SOLN
12.0000 mL/h | EPIDURAL | Status: DC | PRN
  Filled 2020-08-13: qty 250

## 2020-08-13 MED ORDER — ZOLPIDEM TARTRATE 5 MG PO TABS: 5.0000 mg | ORAL_TABLET | Freq: Every evening | ORAL | Status: DC | PRN

## 2020-08-13 MED ORDER — SIMETHICONE 80 MG PO CHEW: 80.0000 mg | CHEWABLE_TABLET | ORAL | Status: DC | PRN

## 2020-08-13 MED ORDER — LACTATED RINGERS IV SOLN: INTRAVENOUS | Status: DC

## 2020-08-13 MED ORDER — LIDOCAINE HCL (PF) 1 % IJ SOLN: 30.0000 mL | INTRAMUSCULAR | Status: DC | PRN

## 2020-08-13 MED ORDER — PHENYLEPHRINE 40 MCG/ML (10ML) SYRINGE FOR IV PUSH (FOR BLOOD PRESSURE SUPPORT)
80.0000 ug | PREFILLED_SYRINGE | INTRAVENOUS | Status: DC | PRN
  Filled 2020-08-13: qty 10

## 2020-08-13 MED ORDER — OXYTOCIN-SODIUM CHLORIDE 30-0.9 UT/500ML-% IV SOLN
2.5000 [IU]/h | INTRAVENOUS | Status: DC
  Administered 2020-08-13: 2.5 [IU]/h via INTRAVENOUS

## 2020-08-13 MED ORDER — IBUPROFEN 600 MG PO TABS
600.0000 mg | ORAL_TABLET | Freq: Four times a day (QID) | ORAL | Status: DC
  Administered 2020-08-13 – 2020-08-15 (×7): 600 mg via ORAL
  Filled 2020-08-13 (×8): qty 1

## 2020-08-13 MED ORDER — PHENYLEPHRINE 40 MCG/ML (10ML) SYRINGE FOR IV PUSH (FOR BLOOD PRESSURE SUPPORT): 80.0000 ug | PREFILLED_SYRINGE | INTRAVENOUS | Status: DC | PRN

## 2020-08-13 MED ORDER — SODIUM CHLORIDE (PF) 0.9 % IJ SOLN
INTRAMUSCULAR | Status: DC | PRN
  Administered 2020-08-13: 12 mL/h via EPIDURAL

## 2020-08-13 MED ORDER — LIDOCAINE-EPINEPHRINE (PF) 2 %-1:200000 IJ SOLN
INTRAMUSCULAR | Status: DC | PRN
  Administered 2020-08-13: 6 mL via EPIDURAL

## 2020-08-13 MED ORDER — ONDANSETRON HCL 4 MG/2ML IJ SOLN
4.0000 mg | Freq: Four times a day (QID) | INTRAMUSCULAR | Status: DC | PRN
  Administered 2020-08-13 (×2): 4 mg via INTRAVENOUS
  Filled 2020-08-13 (×2): qty 2

## 2020-08-13 MED ORDER — ERYTHROMYCIN 5 MG/GM OP OINT
TOPICAL_OINTMENT | OPHTHALMIC | Status: AC
  Filled 2020-08-13: qty 1

## 2020-08-13 MED ORDER — OXYCODONE-ACETAMINOPHEN 5-325 MG PO TABS
2.0000 | ORAL_TABLET | ORAL | Status: DC | PRN
Start: 2020-08-13 — End: 2020-08-13

## 2020-08-13 MED ORDER — OXYTOCIN-SODIUM CHLORIDE 30-0.9 UT/500ML-% IV SOLN
1.0000 m[IU]/min | INTRAVENOUS | Status: DC
  Administered 2020-08-13: 2 m[IU]/min via INTRAVENOUS
  Filled 2020-08-13: qty 500

## 2020-08-13 MED ORDER — OXYTOCIN BOLUS FROM INFUSION
333.0000 mL | Freq: Once | INTRAVENOUS | Status: AC
  Administered 2020-08-13: 333 mL via INTRAVENOUS

## 2020-08-13 MED ORDER — DIBUCAINE (PERIANAL) 1 % EX OINT: 1.0000 "application " | TOPICAL_OINTMENT | CUTANEOUS | Status: DC | PRN

## 2020-08-13 MED ORDER — FLEET ENEMA 7-19 GM/118ML RE ENEM: 1.0000 | ENEMA | RECTAL | Status: DC | PRN

## 2020-08-13 MED ORDER — FENTANYL CITRATE (PF) 100 MCG/2ML IJ SOLN
INTRAMUSCULAR | Status: DC | PRN
  Administered 2020-08-13: 100 ug via EPIDURAL

## 2020-08-13 MED ORDER — WITCH HAZEL-GLYCERIN EX PADS: 1.0000 "application " | MEDICATED_PAD | CUTANEOUS | Status: DC | PRN

## 2020-08-13 MED ORDER — SOD CITRATE-CITRIC ACID 500-334 MG/5ML PO SOLN: 30.0000 mL | ORAL | Status: DC | PRN

## 2020-08-13 MED ORDER — DIPHENHYDRAMINE HCL 25 MG PO CAPS: 25.0000 mg | ORAL_CAPSULE | Freq: Four times a day (QID) | ORAL | Status: DC | PRN

## 2020-08-13 MED ORDER — LACTATED RINGERS IV SOLN: 500.0000 mL | Freq: Once | INTRAVENOUS | Status: DC

## 2020-08-13 MED ORDER — LACTATED RINGERS IV SOLN: 500.0000 mL | INTRAVENOUS | Status: DC | PRN

## 2020-08-13 MED ORDER — SENNOSIDES-DOCUSATE SODIUM 8.6-50 MG PO TABS
2.0000 | ORAL_TABLET | Freq: Every day | ORAL | Status: DC
  Administered 2020-08-14: 2 via ORAL
  Filled 2020-08-13 (×2): qty 2

## 2020-08-13 MED ORDER — ONDANSETRON HCL 4 MG/2ML IJ SOLN: 4.0000 mg | INTRAMUSCULAR | Status: DC | PRN

## 2020-08-13 MED ORDER — ACETAMINOPHEN 325 MG PO TABS
650.0000 mg | ORAL_TABLET | ORAL | Status: DC | PRN
  Administered 2020-08-13: 650 mg via ORAL
  Filled 2020-08-13: qty 2

## 2020-08-13 MED ORDER — COCONUT OIL OIL: 1.0000 "application " | TOPICAL_OIL | Status: DC | PRN

## 2020-08-13 MED ORDER — FENTANYL CITRATE (PF) 100 MCG/2ML IJ SOLN
INTRAMUSCULAR | Status: AC
  Administered 2020-08-13: 100 ug
  Filled 2020-08-13: qty 2

## 2020-08-13 MED ORDER — LIDOCAINE HCL (PF) 1 % IJ SOLN
INTRAMUSCULAR | Status: DC | PRN
  Administered 2020-08-13: 10 mL via EPIDURAL
  Administered 2020-08-13: 2 mL via EPIDURAL

## 2020-08-13 MED ORDER — EPHEDRINE 5 MG/ML INJ: 10.0000 mg | INTRAVENOUS | Status: DC | PRN

## 2020-08-13 MED ORDER — DIPHENHYDRAMINE HCL 50 MG/ML IJ SOLN: 12.5000 mg | INTRAMUSCULAR | Status: DC | PRN

## 2020-08-13 MED ORDER — MISOPROSTOL 25 MCG QUARTER TABLET
25.0000 ug | ORAL_TABLET | ORAL | Status: DC | PRN
  Administered 2020-08-13: 25 ug via VAGINAL
  Filled 2020-08-13: qty 1

## 2020-08-13 MED ORDER — PRENATAL MULTIVITAMIN CH
1.0000 | ORAL_TABLET | Freq: Every day | ORAL | Status: DC
  Administered 2020-08-14 – 2020-08-15 (×2): 1 via ORAL
  Filled 2020-08-13 (×2): qty 1

## 2020-08-13 MED ORDER — BENZOCAINE-MENTHOL 20-0.5 % EX AERO
1.0000 | INHALATION_SPRAY | CUTANEOUS | Status: DC | PRN
Start: 2020-08-13 — End: 2020-08-16
  Filled 2020-08-13: qty 56

## 2020-08-13 NOTE — Progress Notes (Signed)
Patient ID: Judy Ballard, female   DOB: Nov 16, 2000, 19 y.o.   MRN: [id number]  S: Comfortable with epidural O:  Vitals:   08/13/20 0715 08/13/20 0725 08/13/20 0730 08/13/20 0800  BP: 132/84 131/80 133/82 100/85  Pulse: (!) 103 96 96 89  Resp:      Temp: 98.1 F (36.7 C)     TempSrc: Oral     SpO2: 99% 100%    Weight:      Height:       AOx3, NAD FHR 140 reactive, cat 1 tracing cvx 1-2/80/-2 toco irreg  Foley catheter placed transcervically and inflated with 60 cc water  A/P  FWB reassuring Pt w/ epidural  Continue pit

## 2020-08-13 NOTE — Anesthesia Preprocedure Evaluation (Signed)
Anesthesia Evaluation  Patient identified by MRN, date of birth, ID band Patient awake    Reviewed: Allergy & Precautions, Patient's Chart, lab work & pertinent test results  Airway Mallampati: II  TM Distance: >3 FB Neck ROM: Full    Dental no notable dental hx.    Pulmonary neg pulmonary ROS,    Pulmonary exam normal breath sounds clear to auscultation       Cardiovascular hypertension, Normal cardiovascular exam Rhythm:Regular Rate:Normal     Neuro/Psych negative neurological ROS  negative psych ROS   GI/Hepatic Neg liver ROS, GERD  Medicated and Controlled,  Endo/Other  negative endocrine ROS  Renal/GU negative Renal ROS  negative genitourinary   Musculoskeletal negative musculoskeletal ROS (+)   Abdominal   Peds  Hematology  (+) Blood dyscrasia, anemia , hct 29, plt 441   Anesthesia Other Findings   Reproductive/Obstetrics negative OB ROS                            Anesthesia Physical Anesthesia Plan  ASA: II and emergent  Anesthesia Plan: Epidural   Post-op Pain Management:    Induction:   PONV Risk Score and Plan: 2  Airway Management Planned: Natural Airway  Additional Equipment: None  Intra-op Plan:   Post-operative Plan:   Informed Consent: I have reviewed the patients History and Physical, chart, labs and discussed the procedure including the risks, benefits and alternatives for the proposed anesthesia with the patient or authorized representative who has indicated his/her understanding and acceptance.       Plan Discussed with:   Anesthesia Plan Comments:         Anesthesia Quick Evaluation

## 2020-08-13 NOTE — H&P (Signed)
Judy Ballard is a 20 y.o. female presenting for PIH  20 yo G1P0 @ 37+5 presents for IOL for gest HTN. Her pregnancy has been complicated by anemia, chlamydia and trichomoniasis infection, recurrent UTI, sickle cell trait, SMA carrier, and missing several missed prenatal visits. OB History    Gravida  1   Para      Term      Preterm      AB      Living        SAB      IAB      Ectopic      Multiple      Live Births             Past Medical History:  Diagnosis Date  . Tonsillitis   . UTI (urinary tract infection)    Past Surgical History:  Procedure Laterality Date  . TONSILLECTOMY Bilateral 07/16/2019   Procedure: TONSILLECTOMY;  Surgeon: Serena Colonel, MD;  Location: [facility];  Service: ENT;  Laterality: Bilateral;  . UMBILICAL HERNIA REPAIR     Family History: family history is not on file. Social History:  reports that she has never smoked. She has never used smokeless tobacco. She reports previous drug use. She reports that she does not drink alcohol.     Maternal Diabetes: No Genetic Screening: Abnormal:  Results: Other:+ SMA carrier (FOB untested), 2 copies, NIPT low risk female Maternal Ultrasounds/Referrals: Normal Isolated EIF Fetal Ultrasounds or other Referrals:  None Maternal Substance Abuse:  No Significant Maternal Medications:  None Significant Maternal Lab Results:  Other: + sickle cell trait, FOB untested Other Comments:  None  Review of Systems History Dilation: 1.5 Effacement (%): 80 Station: -1 Exam by:: Tiernan Suto MD Blood pressure 133/82, pulse 96, temperature 98.1 F (36.7 C), temperature source Oral, resp. rate 20, height 5\' 2"  (1.575 m), weight 79.9 kg, last menstrual period 06/17/2019, SpO2 100 %. Exam Physical Exam  Prenatal labs: ABO, Rh: --/--/O POS (01/19 0030) Antibody: NEG (01/19 0030) Rubella:  Imm RPR:   NR HBsAg:   Neg HIV:   NR GBS:   Neg  Results for orders placed or performed during the  hospital encounter of 08/13/20 (from the past 24 hour(s))  Type and screen     Status: None   Collection Time: 08/13/20 12:30 AM  Result Value Ref Range   ABO/RH(D) O POS    Antibody Screen NEG    Sample Expiration      08/16/2020,2359 Performed at [facility], [address] [place], [place]   CBC     Status: Abnormal   Collection Time: 08/13/20 12:31 AM  Result Value Ref Range   WBC 11.0 (H) 4.0 - 10.5 K/uL   RBC 3.42 (L) 3.87 - 5.11 MIL/uL   Hemoglobin 9.5 (L) 12.0 - 15.0 g/dL   HCT 08/15/20 (L) 00.9 - 38.1 %   MCV 84.8 80.0 - 100.0 fL   MCH 27.8 26.0 - 34.0 pg   MCHC 32.8 30.0 - 36.0 g/dL   RDW 82.9 (H) 93.7 - 16.9 %   Platelets 441 (H) 150 - 400 K/uL   nRBC 0.0 0.0 - 0.2 %  Resp Panel by RT-PCR (Flu A&B, Covid) Nasopharyngeal Swab     Status: None   Collection Time: 08/13/20 12:31 AM   Specimen: Nasopharyngeal Swab; Nasopharyngeal(NP) swabs in vial transport medium  Result Value Ref Range   SARS Coronavirus 2 by RT PCR NEGATIVE NEGATIVE  Influenza A by PCR NEGATIVE NEGATIVE   Influenza B by PCR NEGATIVE NEGATIVE     Assessment/Plan: 1) Admit 2) Misoprostal Q4 hrs PV then assess  3) Epidural on request 4) Labetalol per protocol  Waynard Reeds 08/13/2020, 8:13 AM

## 2020-08-13 NOTE — Anesthesia Procedure Notes (Signed)
Epidural Patient location during procedure: OB Start time: 08/13/2020 6:55 AM End time: 08/13/2020 7:04 AM  Staffing Anesthesiologist: Lannie Fields, DO Performed: anesthesiologist   Preanesthetic Checklist Completed: patient identified, IV checked, risks and benefits discussed, monitors and equipment checked, pre-op evaluation and timeout performed  Epidural Patient position: sitting Prep: DuraPrep and site prepped and draped Patient monitoring: continuous pulse ox, blood pressure, heart rate and cardiac monitor Approach: midline Location: L3-L4 Injection technique: LOR air  Needle:  Needle type: Tuohy  Needle gauge: 17 G Needle length: 9 cm Needle insertion depth: 7 cm Catheter type: closed end flexible Catheter size: 19 Gauge Catheter at skin depth: 12 cm Test dose: negative  Assessment Sensory level: T8 Events: blood not aspirated, injection not painful, no injection resistance, no paresthesia and negative IV test  Additional Notes Patient identified. Risks/Benefits/Options discussed with patient including but not limited to bleeding, infection, nerve damage, paralysis, failed block, incomplete pain control, headache, blood pressure changes, nausea, vomiting, reactions to medication both or allergic, itching and postpartum back pain. Confirmed with bedside nurse the patient's most recent platelet count. Confirmed with patient that they are not currently taking any anticoagulation, have any bleeding history or any family history of bleeding disorders. Patient expressed understanding and wished to proceed. All questions were answered. Sterile technique was used throughout the entire procedure. Please see nursing notes for vital signs. Test dose was given through epidural catheter and negative prior to continuing to dose epidural or start infusion. Warning signs of high block given to the patient including shortness of breath, tingling/numbness in hands, complete motor  block, or any concerning symptoms with instructions to call for help. Patient was given instructions on fall risk and not to get out of bed. All questions and concerns addressed with instructions to call with any issues or inadequate analgesia.  Reason for block:procedure for pain

## 2020-08-14 LAB — CBC
HCT: 25.1 % — ABNORMAL LOW (ref 36.0–46.0)
Hemoglobin: 8.2 g/dL — ABNORMAL LOW (ref 12.0–15.0)
MCH: 27.9 pg (ref 26.0–34.0)
MCHC: 32.7 g/dL (ref 30.0–36.0)
MCV: 85.4 fL (ref 80.0–100.0)
Platelets: 396 10*3/uL (ref 150–400)
RBC: 2.94 MIL/uL — ABNORMAL LOW (ref 3.87–5.11)
RDW: 16.9 % — ABNORMAL HIGH (ref 11.5–15.5)
WBC: 13.2 10*3/uL — ABNORMAL HIGH (ref 4.0–10.5)
nRBC: 0 % (ref 0.0–0.2)

## 2020-08-14 MED ORDER — INFLUENZA VAC SPLIT QUAD 0.5 ML IM SUSY
0.5000 mL | PREFILLED_SYRINGE | INTRAMUSCULAR | Status: AC
  Administered 2020-08-15: 0.5 mL via INTRAMUSCULAR
  Filled 2020-08-14: qty 0.5

## 2020-08-14 NOTE — Progress Notes (Signed)
Post Partum Day 1 Subjective: no complaints, up ad lib, voiding and denies HA/VC/RUQ pain  Objective: Patient Vitals for the past 24 hrs:  BP Temp Temp src Pulse Resp SpO2  08/14/20 0720 128/86 98.2 F (36.8 C) Oral 72 20 100 %  08/14/20 0335 135/87 98.3 F (36.8 C) Oral 93 18 98 %  08/13/20 2330 (!) 134/91 98.3 F (36.8 C) Oral 82 18 98 %  08/13/20 1925 132/84 98.3 F (36.8 C) Oral 84 17 100 %  08/13/20 1816 127/84 98.5 F (36.9 C) Oral 84 17 99 %  08/13/20 1746 128/76 - - 79 - -  08/13/20 1716 93/74 - - 94 - -  08/13/20 1631 131/78 - - (!) 114 - -  08/13/20 1531 132/73 - - (!) 101 - -  08/13/20 1431 137/86 - - (!) 107 - -  08/13/20 1311 139/81 - - (!) 115 - -  08/13/20 1309 (!) 145/84 - - (!) 118 - -  08/13/20 1308 131/85 - - (!) 110 - -  08/13/20 1307 - 97.9 F (36.6 C) Oral - - -  08/13/20 1301 105/88 - - (!) 124 - -  08/13/20 1235 (!) 141/76 - - (!) 113 - 100 %  08/13/20 1231 (!) 143/84 - - (!) 108 - -  08/13/20 1201 (!) 143/81 - - (!) 102 - -  08/13/20 1131 139/87 - - (!) 104 - -  08/13/20 1102 (!) 146/81 98.3 F (36.8 C) Axillary (!) 101 - -    Physical Exam:  General: alert Lochia: appropriate Uterine Fundus: firm DVT Evaluation: No evidence of DVT seen on physical exam.  Recent Labs    08/13/20 0031 08/13/20 1729 08/14/20 0655  WBC 11.0* 14.6* 13.2*  HGB 9.5* 9.1* 8.2*  HCT 29.0* 27.6* 25.1*  PLT 441* 350 396   Assessment/Plan: Plan for discharge tomorrow  Burtis Junes 19 y.o. G1P1001 PPD#1 sp SVD at [redacted]w[redacted]d for IOL 2/2 GHTN 1. PPC: No lacs, Hgb 9.1>8.2, EBL 402cc 2. GHTN: mild range BP, continue to monitor, start antihypertensive PRN 3. Anemia in pregnancy: continue PO iron 4. CT/Trich infections, s/p treatment. Neg STD testing 08/06/2020 5. Recurrent UTI: had been on macrobid suppression 6. 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 Scant PNC: discussed with SW Vaccines: s/p tdap in pregnancy. COVID #2 due Feb 2nd, flu vaccine recommended Rubella immune, blood  type O POS, pumping/formula feeding, baby girl in room   LOS: 1 day   Adileny Delon K Taam-Akelman 08/14/2020, 10:30 AM

## 2020-08-14 NOTE — Lactation Note (Signed)
This note was copied from a baby's chart. Lactation Consultation Note  Patient Name: Judy Ballard EQAST'M Date: 08/14/2020 Reason for consult: Initial assessment;Early term 37-38.6wks Age:21 hours P1, ETI female infant, DAT+ -3% weight loss. Mom with hx: sickle cell trait and GHTN Infant had 5 voids and 3 stools. Mom's original plan was formula fed only but now, mom  has decided to BF ( latching infant at the breast) and formula feed infant. LC entered room, RN was teaching mom how to use DEBP, LC finished teaching and mom understands to pump every 3 hours for 15 minutes on initial setting. Infant was cuing to BF, mom decided she wants to BF infant and supplement infant with formula. Mom latched infant on her right breast using the football hold position, infant latched with depth and BF for 15 minutes swallows could be heard. Mom said she felt a tickle with latch but not pain only a tug. Afterwards LC taught mom hand expression and mom easily expressed 10 mls of EBM that was given to infant by spoon, infant was not supplemented with formula at this time. MGM is very support with mom breastfeeding, per MGM she BF her last two children.  Mom shown how to use DEBP & how to disassemble, clean, & reassemble parts. Mom made aware of O/P services, breastfeeding support groups, community resources, and our phone # for post-discharge questions.  Mom's current plan:  1-Mom understand to BF infant according to cues, 8 to 12+ times within 24 hours, STS. 2-Mom will supplement infant afterwards with her EBM first that is pumped or hand expressed and then offer formula. 3-Mom was given BF supplemental sheet based on infant's age/ hours of life, Day 2 supplement amount is 7-12 mls per feeding 4-Mom knows to pump every 3 hours for 15 minutes on initital setting  5-Mom knows to call RN or LC if she needs assistance with latching infant at the breast.  Maternal Data Formula Feeding for Exclusion:  Yes Reason for exclusion: Mother's choice to formula feed on admision Has patient been taught Hand Expression?: Yes Does the patient have breastfeeding experience prior to this delivery?: No  Feeding Feeding Type: Breast Fed Nipple Type: Slow - flow  LATCH Score Latch: Grasps breast easily, tongue down, lips flanged, rhythmical sucking.  Audible Swallowing: Spontaneous and intermittent  Type of Nipple: Everted at rest and after stimulation  Comfort (Breast/Nipple): Soft / non-tender  Hold (Positioning): Assistance needed to correctly position infant at breast and maintain latch.  LATCH Score: 9  Interventions Interventions: Breast feeding basics reviewed;Assisted with latch;Breast compression;Skin to skin;Adjust position;Breast massage;Support pillows;Hand express;Position options;Pre-pump if needed;Expressed milk;DEBP  Lactation Tools Discussed/Used WIC Program: No Pump Education: Setup, frequency, and cleaning;Milk Storage Initiated by:: RN and finished by [facility] Date initiated:: 08/14/20   Consult Status Consult Status: Follow-up Date: 08/15/20 Follow-up type: In-patient    Danelle Earthly 08/14/2020, 6:26 PM

## 2020-08-14 NOTE — Social Work (Signed)
CSW consulted to assist MOB with [facility] resources. CSW met with MOB to assess and offer resources. CSW observed MOB holding newborn Judy Ballard. CSW introduced self and role. CSW observed MGM Judy Ballard also bedside. MOB confirmed interest in [facility] and allowed CSW to set-up a [facility] appointment via telephone for January 31. CSW assessed MOB current emotional state. MOB reported she is currently doing well and denied any SI or HI.  CSW provided education regarding the baby blues period vs. perinatal mood disorders, discussed treatment and gave resources for mental health follow up if concerns arise.  CSW recommends self-evaluation during the postpartum time period using the New Mom Checklist from Postpartum Progress and encouraged MOB to contact a medical professional if symptoms are noted at any time.    CSW provided review of Sudden Infant Death Syndrome (SIDS) precautions.  MOB reported baby will sleep in a bassinet. CSW informed by MD that MOB missed several prenatal appointments. CSW inquired on any barriers to follow-up care with MOB. MOB declined having any barriers to prenatal appointments or future appointments. MOB stated she has everything needed for baby and declined additional resources.   CSW identifies no further need for intervention and no barriers to discharge at this time.  Darra Lis, [place] [employer] [phone]

## 2020-08-14 NOTE — Anesthesia Postprocedure Evaluation (Signed)
Anesthesia Post Note  Patient: Judy Ballard  Procedure(s) Performed: AN AD HOC LABOR EPIDURAL     Patient location during evaluation: Mother Baby Anesthesia Type: Epidural Level of consciousness: awake, awake and alert and oriented Pain management: pain level controlled Vital Signs Assessment: post-procedure vital signs reviewed and stable Respiratory status: spontaneous breathing and respiratory function stable Cardiovascular status: blood pressure returned to baseline Postop Assessment: no headache, epidural receding, patient able to bend at knees, adequate PO intake, no backache, no apparent nausea or vomiting and able to ambulate Anesthetic complications: no   No complications documented.  Last Vitals:  Vitals:   08/14/20 0335 08/14/20 0720  BP: 135/87 128/86  Pulse: 93 72  Resp: 18 20  Temp: 36.8 C 36.8 C  SpO2: 98% 100%    Last Pain:  Vitals:   08/14/20 0720  TempSrc: Oral  PainSc: 0-No pain   Pain Goal:                   Cleda Clarks

## 2020-08-15 MED ORDER — NIFEDIPINE ER OSMOTIC RELEASE 30 MG PO TB24
30.0000 mg | ORAL_TABLET | Freq: Once | ORAL | Status: AC
  Administered 2020-08-15: 30 mg via ORAL

## 2020-08-15 MED ORDER — ACETAMINOPHEN 325 MG PO TABS: 650.0000 mg | ORAL_TABLET | ORAL | 0 refills | Status: DC | PRN

## 2020-08-15 MED ORDER — NIFEDIPINE ER 30 MG PO TB24: 60.0000 mg | ORAL_TABLET | Freq: Every day | ORAL | 11 refills | Status: DC

## 2020-08-15 MED ORDER — IBUPROFEN 600 MG PO TABS: 600.0000 mg | ORAL_TABLET | Freq: Four times a day (QID) | ORAL | 0 refills | Status: DC

## 2020-08-15 MED ORDER — NIFEDIPINE ER OSMOTIC RELEASE 30 MG PO TB24
30.0000 mg | ORAL_TABLET | Freq: Every day | ORAL | Status: DC
  Administered 2020-08-15: 30 mg via ORAL
  Filled 2020-08-15 (×2): qty 1

## 2020-08-15 MED ORDER — NIFEDIPINE ER OSMOTIC RELEASE 30 MG PO TB24
30.0000 mg | ORAL_TABLET | Freq: Every day | ORAL | Status: DC
Start: 2020-08-15 — End: 2020-08-15

## 2020-08-15 NOTE — Discharge Instructions (Signed)
Hypertension During Pregnancy Hypertension is also called high blood pressure. High blood pressure means that the force of the blood moving in your body is high enough to cause problems for you and your baby. Different types of high blood pressure can happen during pregnancy. The types are:  High blood pressure before you got pregnant. This is called chronic hypertension.  This can continue during your pregnancy. Your doctor will want to keep checking your blood pressure. You may need medicine to control your blood pressure while you are pregnant. You will need follow-up visits after you have your baby.  High blood pressure that goes up during pregnancy when it was normal before. This is called gestational hypertension. It will often get better after you have your baby, but your doctor will need to watch your blood pressure to make sure that it is getting better.  You may develop high blood pressure after giving birth. This is called postpartum hypertension. This often occurs within 48 hours after childbirth but may occur up to 6 weeks after giving birth. Very high blood pressure during pregnancy is an emergency that needs treatment right away. How does this affect me? If you have high blood pressure during pregnancy, you have a higher chance of developing high blood pressure:  As you get older.  If you get pregnant again. In some cases, high blood pressure during pregnancy can cause:  Stroke.  Heart attack.  Damage to the kidneys, lungs, or liver.  Preeclampsia.  HELLP syndrome.  Seizures.  Problems with the placenta. How does this affect my baby? Your baby may:  Be born early.  Not weigh as much as he or she should.  Not handle labor well, leading to a C-section. This condition may also result in a baby's death before birth (stillbirth). What are the risks?  Having high blood pressure during a past pregnancy.  Being overweight.  Being age 25 or older.  Being pregnant  for the first time.  Being pregnant with more than one baby.  Becoming pregnant using fertility methods, such as IVF.  Having other problems, such as diabetes or kidney disease. What can I do to lower my risk?  Keep a healthy weight.  Eat a healthy diet.  Follow what your doctor tells you about treating any medical problems that you had before you got pregnant. It is very important to go to all of your doctor visits. Your doctor will check your blood pressure and make sure that your pregnancy is progressing as it should. Treatment should start early if a problem is found.   How is this treated? Treatment for high blood pressure during pregnancy can vary. It depends on the type of high blood pressure you have and how serious it is.  If you were taking medicine for your blood pressure before you got pregnant, talk with your doctor. You may need to change the medicine during pregnancy if it is not safe for your baby.  If your blood pressure goes up during pregnancy, your doctor may order medicine to treat this.  If you are at risk for preeclampsia, your doctor may tell you to take a low-dose aspirin while you are pregnant.  If you have very high blood pressure, you may need to stay in the hospital so you and your baby can be watched closely. You may also need to take medicine to lower your blood pressure.  In some cases, if your condition gets worse, you may need to have your baby early.  Follow these instructions at home: Eating and drinking  Drink enough fluid to keep your pee (urine) pale yellow.  Avoid caffeine.   Lifestyle  Do not smoke or use any products that contain nicotine or tobacco. If you need help quitting, ask your doctor.  Do not use alcohol or drugs.  Avoid stress.  Rest and get plenty of sleep.  Regular exercise can help. Ask your doctor what kinds of exercise are best for you. General instructions  Take over-the-counter and prescription medicines only as  told by your doctor.  Keep all prenatal and follow-up visits. Contact a doctor if:  You have symptoms that your doctor told you to watch for, such as: ? Headaches. ? A feeling like you may vomit (nausea). ? Vomiting. ? Belly (abdominal) pain. ? Feeling dizzy or light-headed. Get help right away if:  You have symptoms of serious problems, such as: ? Very bad belly pain that does not get better with treatment. ? A very bad headache that does not get better. ? Blurry vision. ? Double vision. ? Vomiting that does not get better. ? Sudden, fast weight gain. ? Sudden swelling in your hands, ankles, or face. ? Bleeding from your vagina. ? Blood in your pee. ? Shortness of breath. ? Chest pain. ? Weakness on one side of your body. ? Trouble talking.  Your baby is not moving as much as usual. These symptoms may be an emergency. Get help right away. Call your local emergency services (911 in the [place]  Do not wait to see if the symptoms will go away.  Do not drive yourself to the hospital. Summary  High blood pressure is also called hypertension.  High blood pressure means that the force of the blood moving in your body is high enough to cause problems for you and your baby.  Get help right away if you have symptoms of serious problems due to high blood pressure.  Keep all prenatal and follow-up visits. This information is not intended to replace advice given to you by your health care provider. Make sure you discuss any questions you have with your health care provider. Document Revised: 04/03/2020 Document Reviewed: 04/03/2020 Elsevier Patient Education  2021 [employer] Inc[employer] Postpartum Care After Vaginal Delivery The following information offers guidance about how to care for yourself from the time you deliver your baby to 6-12 weeks after delivery (postpartum period). If you have problems or questions, contact your health care provider for more specific instructions. Follow  these instructions at home: Vaginal bleeding  It is normal to have vaginal bleeding (lochia) after delivery. Wear a sanitary pad for bleeding and discharge. ? During the first week after delivery, the amount and appearance of lochia is often similar to a menstrual period. ? Over the next few weeks, it will gradually decrease to a dry, yellow-brown discharge. ? For most women, lochia stops completely by 4-6 weeks after delivery, but can vary.  Change your sanitary pads frequently. Watch for any changes in your flow, such as: ? A sudden increase in volume. ? A change in color. ? Large blood clots.  If you pass a blood clot from your vagina, save it and call your health care provider. Do not flush blood clots down the toilet before talking with your health care provider.  Do not use tampons or douches until your health care provider approves.  If you are not breastfeeding, your period should return 6-8 weeks after delivery. If you are feeding your baby breast  milk only, your period may not return until you stop breastfeeding. Perineal care  Keep the area between the vagina and the anus (perineum) clean and dry. Use medicated pads and pain-relieving sprays and creams as directed.  If you had a surgical cut in the perineum (episiotomy) or a tear, check the area for signs of infection until you are healed. Check for: ? More redness, swelling, or pain. ? Fluid or blood coming from the cut or tear. ? Warmth. ? Pus or a bad smell.  You may be given a squirt bottle to use instead of wiping to clean the perineum area after you use the bathroom. Pat the area gently to dry it.  To relieve pain caused by an episiotomy, a tear, or swollen veins in the anus (hemorrhoids), take a warm sitz bath 2-3 times a day. In a sitz bath, the warm water should only come up to your hips and cover your buttocks.   Breast care  In the first few days after delivery, your breasts may feel heavy, full, and  uncomfortable (breast engorgement). Milk may also leak from your breasts. Ask your health care provider about ways to help relieve the discomfort.  If you are breastfeeding: ? Wear a bra that supports your breasts and fits well. Use breast pads to absorb milk that leaks. ? Keep your nipples clean and dry. Apply creams and ointments as told. ? You may have uterine contractions every time you breastfeed for up to several weeks after delivery. This helps your uterus return to its normal size. ? If you have any problems with breastfeeding, notify your health care provider or lactation consultant.  If you are not breastfeeding: ? Avoid touching your breasts. Do not squeeze out (express) milk. Doing this can make your breasts produce more milk. ? Wear a good-fitting bra and use cold packs to help with swelling. Intimacy and sexuality  Ask your health care provider when you can engage in sexual activity. This may depend upon: ? Your risk of infection. ? How fast you are healing. ? Your comfort and desire to engage in sexual activity.  You are able to get pregnant after delivery, even if you have not had your period. Talk with your health care provider about methods of birth control (contraception) or family planning if you desire future pregnancies. Medicines  Take over-the-counter and prescription medicines only as told by your health care provider.  Take an over-the-counter stool softener to help ease bowel movements as told by your health care provider.  If you were prescribed an antibiotic medicine, take it as told by your health care provider. Do not stop taking the antibiotic even if you start to feel better.  Review all previous and current prescriptions to check for possible transfer into breast milk. Activity  Gradually return to your normal activities as told by your health care provider.  Rest as much as possible. Nap while your baby is sleeping. Eating and drinking  Drink  enough fluid to keep your urine pale yellow.  To help prevent or relieve constipation, eat high-fiber foods every day.  Choose healthy eating to support breastfeeding or weight loss goals.  Take your prenatal vitamins until your health care provider tells you to stop.   General tips/recommendations  Do not use any products that contain nicotine or tobacco. These products include cigarettes, chewing tobacco, and vaping devices, such as e-cigarettes. If you need help quitting, ask your health care provider.  Do not drink alcohol, especially if  you are breastfeeding.  Do not take medications or drugs that are not prescribed to you, especially if you are breastfeeding.  Visit your health care provider for a postpartum checkup within the first 3-6 weeks after delivery.  Complete a comprehensive postpartum visit no later than 12 weeks after delivery.  Keep all follow-up visits for you and your baby. Contact a health care provider if:  You feel unusually sad or worried.  Your breasts become red, painful, or hard.  You have a fever or other signs of an infection.  You have bleeding that is soaking through one pad an hour or you have blood clots.  You have a severe headache that doesn't go away or you have vision changes.  You have nausea and vomiting and are unable to eat or drink anything for 24 hours. Get help right away if:  You have chest pain or difficulty breathing.  You have sudden, severe leg pain.  You faint or have a seizure.  You have thoughts about hurting yourself or your baby. If you ever feel like you may hurt yourself or others, or have thoughts about taking your own life, get help right away. Go to your nearest emergency department or:  Call your local emergency services (911 in the [place]  The National Suicide Prevention Lifeline at 253 283 5324. This suicide crisis helpline is open 24 hours a day.  Text the Crisis Text Line at (201)210-0955 (in the  [place] Summary  The period of time after you deliver your newborn up to 6-12 weeks after delivery is called the postpartum period.  Keep all follow-up visits for you and your baby.  Review all previous and current prescriptions to check for possible transfer into breast milk.  Contact a health care provider if you feel unusually sad or worried during the postpartum period. This information is not intended to replace advice given to you by your health care provider. Make sure you discuss any questions you have with your health care provider. Document Revised: 03/27/2020 Document Reviewed: 03/27/2020 Elsevier Patient Education  2021 [employer].

## 2020-08-15 NOTE — Lactation Note (Signed)
This note was copied from a baby's chart. Lactation Consultation Note  Patient Name: [name] [id number] Date: 08/15/2020   Age:21 hours  Mom being d/c today. Mom reports she feels they are doing well with feeding.  Mom denies need for assistance at this time.  Mom has [facility] Breastfeeding resource list for home use.   Maternal Data    Feeding    LATCH Score                   Interventions    Lactation Tools Discussed/Used     Consult Status      Makiyla Linch Michaelle Copas 08/15/2020, 1:55 PM

## 2020-08-15 NOTE — Discharge Summary (Signed)
Postpartum Discharge Summary     Patient Name: Judy Ballard DOB: [birth year] MRN: [id number]  Date of admission: 08/13/2020 Delivery date:08/13/2020  Delivering provider: Vanessa Kick  Date of discharge: 08/15/2020  Admitting diagnosis: Gestational hypertension [O13.9] Spontaneous vaginal delivery [O80] Intrauterine pregnancy: [redacted]w[redacted]d    Secondary diagnosis:  Active Problems:   Gestational hypertension   Spontaneous vaginal delivery  Additional problems: Anemia    Discharge diagnosis: Term Pregnancy Delivered and Gestational Hypertension                                              Post partum procedures:none Augmentation: Pitocin, Cytotec and IP Foley Complications: None  Hospital course: Induction of Labor With Vaginal Delivery   20y.o. yo G1P1001 at 310w5das admitted to the hospital 08/13/2020 for induction of labor.  Indication for induction: Gestational hypertension.  Patient had an uncomplicated labor course as follows: Membrane Rupture Time/Date: 9:32 AM ,08/13/2020   Delivery Method:Vaginal, Spontaneous  Episiotomy: None  Lacerations:  None  Details of delivery can be found in separate delivery note.  Patient had a routine postpartum course. Elevated Bps on PPD2, Procardia started and increased to PrRose Hillith good response.  Patient is discharged home 08/15/20.  Newborn Data: Birth date:08/13/2020  Birth time:4:28 PM  Gender:Female  Living status:Living  Apgars:9 ,9  Weight:3345 g   Magnesium Sulfate received: No BMZ received: No Rhophylac:N/A MMR:N/A T-DaP:Given prenatally Flu: No Transfusion:No  Physical exam  Vitals:   08/15/20 1128 08/15/20 1340 08/15/20 1623 08/15/20 1750  BP: 134/90 (!) 136/92 131/88 135/85  Pulse: 88 88 82 91  Resp:   16   Temp:   98.4 F (36.9 C)   TempSrc:   Oral   SpO2:   100%   Weight:      Height:       General: alert, cooperative and no distress Lochia: appropriate Uterine Fundus: firm Incision:  N/A DVT Evaluation: No evidence of DVT seen on physical exam. Labs: Lab Results  Component Value Date   WBC 13.2 (H) 08/14/2020   HGB 8.2 (L) 08/14/2020   HCT 25.1 (L) 08/14/2020   MCV 85.4 08/14/2020   PLT 396 08/14/2020   CMP Latest Ref Rng & Units 08/04/2020  Glucose 70 - 99 mg/dL 86  BUN 6 - 20 mg/dL <5(L)  Creatinine 0.44 - 1.00 mg/dL 0.75  Sodium 135 - 145 mmol/L 135  Potassium 3.5 - 5.1 mmol/L 4.2  Chloride 98 - 111 mmol/L 104  CO2 22 - 32 mmol/L 19(L)  Calcium 8.9 - 10.3 mg/dL 8.9  Total Protein 6.5 - 8.1 g/dL 6.7  Total Bilirubin 0.3 - 1.2 mg/dL 1.0  Alkaline Phos 38 - 126 U/L 185(H)  AST 15 - 41 U/L 23  ALT 0 - 44 U/L 10   Edinburgh Score: Edinburgh Postnatal Depression Scale Screening Tool 08/13/2020  I have been able to laugh and see the funny side of things. (No Data)      After visit meds:  Allergies as of 08/15/2020      Reactions   Apple Hives   Swollen lips   Fish Allergy Hives   tilapia      Medication List    STOP taking these medications   cyclobenzaprine 10 MG tablet Commonly known as: FLEXERIL   famotidine 10 MG tablet Commonly known as: PEPCID  metoCLOPramide 10 MG tablet Commonly known as: REGLAN   omeprazole 20 MG capsule Commonly known as: PRILOSEC   ondansetron 8 MG disintegrating tablet Commonly known as: ZOFRAN-ODT   promethazine 25 MG tablet Commonly known as: PHENERGAN     TAKE these medications   acetaminophen 325 MG tablet Commonly known as: Tylenol Take 2 tablets (650 mg total) by mouth every 4 (four) hours as needed (for pain scale < 4).   ibuprofen 600 MG tablet Commonly known as: ADVIL Take 1 tablet (600 mg total) by mouth every 6 (six) hours.   NIFEdipine 30 MG 24 hr tablet Commonly known as: ADALAT CC Take 2 tablets (60 mg total) by mouth daily. Start taking on: August 16, 2020   prenatal multivitamin Tabs tablet Take 1 tablet by mouth daily at 12 noon.        Discharge home in stable  condition Infant Feeding: Bottle and Breast Infant Disposition:home with mother Discharge instruction: per After Visit Summary and Postpartum booklet. Activity: Advance as tolerated. Pelvic rest for 6 weeks.  Diet: low salt diet Anticipated Birth Control: Unsure Postpartum Appointment:4 weeks Additional Postpartum F/U: BP check 2-3 days Future Appointments:No future appointments. Follow up Visit:  Follow-up Information    Rowland Lathe, MD Follow up in 3 day(s).   Specialty: Obstetrics and Gynecology Contact information: [address] [place] [address] [phone]                   08/15/2020 Rowland Lathe, MD

## 2020-08-15 NOTE — Progress Notes (Signed)
Post Partum Day 2 Subjective: Judy Ballard is feeling well this morning without complaints. No headache, vision changes, RUQ pain. Tolerating a regular diet, ambulating and voiding without issues. Minimal lochia. Breastfeeding.   Objective: Patient Vitals for the past 24 hrs:  BP Temp Temp src Pulse Resp SpO2  08/15/20 0526 (!) 139/95 98.2 F (36.8 C) Oral 80 16 99 %  08/14/20 2105 120/87 98.5 F (36.9 C) Oral 93 18 94 %  08/14/20 1400 132/87 98.5 F (36.9 C) Oral 88 20 --    Physical Exam:  General: alert, cooperative and no distress Lochia: appropriate Uterine Fundus: firm DVT Evaluation: No evidence of DVT seen on physical exam.  Recent Labs    08/13/20 0031 08/13/20 1729 08/14/20 0655  WBC 11.0* 14.6* 13.2*  HGB 9.5* 9.1* 8.2*  HCT 29.0* 27.6* 25.1*  PLT 441* 350 396    No results for input(s): NA, K, CL, CO2CT, BUN, CREATININE, GLUCOSE, BILITOT, ALT, AST, ALKPHOS, PROT, ALBUMIN in the last 72 hours.  No results for input(s): CALCIUM, MG, PHOS in the last 72 hours.  No results for input(s): PROTIME, APTT, INR in the last 72 hours.  No results for input(s): PROTIME, APTT, INR, FIBRINOGEN in the last 72 hours. Assessment/Plan:  Judy Ballard 19 y.o. G1P1001 PPD#2 sp SVD 1. PPC: continue routine postpartum care 2. Gestational HTN: had been normotensive post delivery however elevated BP this morning, will start Procardia 30 and consider D/C home later today if Bps controlled 3. Poor compliance with PNC: s/p SW consult 4. Anemia: continue PO iron 5. Vaccines: s/p tdap in pregnancy. COVID #2 due Feb 2nd, flu vaccine recommended Rubella immune, blood type O POS  LOS: 2 days   Charlett Nose 08/15/2020, 9:17 AM

## 2020-08-19 ENCOUNTER — Telehealth: Payer: Self-pay

## 2020-08-19 NOTE — Telephone Encounter (Signed)
Transition Care Management Unsuccessful Follow-up Telephone Call  Date of discharge and from where:  08/15/2020 [facility]   Attempts:  1st Attempt  Reason for unsuccessful TCM follow-up call:  Left voice message

## 2020-09-25 DIAGNOSIS — M25542 Pain in joints of left hand: Secondary | ICD-10-CM | POA: Diagnosis not present

## 2020-09-25 DIAGNOSIS — E669 Obesity, unspecified: Secondary | ICD-10-CM | POA: Diagnosis not present

## 2020-09-25 DIAGNOSIS — Z131 Encounter for screening for diabetes mellitus: Secondary | ICD-10-CM | POA: Diagnosis not present

## 2020-09-25 DIAGNOSIS — M25541 Pain in joints of right hand: Secondary | ICD-10-CM | POA: Diagnosis not present

## 2020-09-25 DIAGNOSIS — O165 Unspecified maternal hypertension, complicating the puerperium: Secondary | ICD-10-CM | POA: Diagnosis not present

## 2020-10-02 DIAGNOSIS — M25542 Pain in joints of left hand: Secondary | ICD-10-CM | POA: Diagnosis not present

## 2020-10-02 DIAGNOSIS — D509 Iron deficiency anemia, unspecified: Secondary | ICD-10-CM | POA: Diagnosis not present

## 2020-10-02 DIAGNOSIS — E669 Obesity, unspecified: Secondary | ICD-10-CM | POA: Diagnosis not present

## 2020-10-02 DIAGNOSIS — O165 Unspecified maternal hypertension, complicating the puerperium: Secondary | ICD-10-CM | POA: Diagnosis not present

## 2020-10-02 DIAGNOSIS — M25541 Pain in joints of right hand: Secondary | ICD-10-CM | POA: Diagnosis not present

## 2020-10-03 DIAGNOSIS — D509 Iron deficiency anemia, unspecified: Secondary | ICD-10-CM | POA: Diagnosis not present

## 2020-11-12 DIAGNOSIS — M79642 Pain in left hand: Secondary | ICD-10-CM | POA: Diagnosis not present

## 2020-11-12 DIAGNOSIS — M79641 Pain in right hand: Secondary | ICD-10-CM | POA: Diagnosis not present

## 2020-12-23 ENCOUNTER — Emergency Department (HOSPITAL_COMMUNITY): Payer: Medicaid Other

## 2020-12-23 ENCOUNTER — Encounter (HOSPITAL_COMMUNITY): Payer: Self-pay | Admitting: *Deleted

## 2020-12-23 ENCOUNTER — Emergency Department (HOSPITAL_COMMUNITY)
Admission: EM | Admit: 2020-12-23 | Discharge: 2020-12-23 | Disposition: A | Discharge status: Home / Self Care | Payer: Medicaid Other | Attending: Emergency Medicine | Admitting: Emergency Medicine

## 2020-12-23 DIAGNOSIS — W010XXA Fall on same level from slipping, tripping and stumbling without subsequent striking against object, initial encounter: Secondary | ICD-10-CM | POA: Insufficient documentation

## 2020-12-23 DIAGNOSIS — S8391XA Sprain of unspecified site of right knee, initial encounter: Secondary | ICD-10-CM

## 2020-12-23 DIAGNOSIS — S8991XA Unspecified injury of right lower leg, initial encounter: Secondary | ICD-10-CM | POA: Diagnosis not present

## 2020-12-23 DIAGNOSIS — M25561 Pain in right knee: Secondary | ICD-10-CM | POA: Diagnosis not present

## 2020-12-23 NOTE — Discharge Instructions (Signed)
Your x-rays were negative

## 2020-12-23 NOTE — Progress Notes (Signed)
Orthopedic Tech Progress Note Patient Details:  Judy Ballard 04/06/2001 [id number]  Ortho Devices Type of Ortho Device: Knee Sleeve Ortho Device/Splint Location: Right Knee Ortho Device/Splint Interventions: Application   Post Interventions Patient Tolerated: Well   [name] 12/23/2020, 10:29 AM

## 2020-12-23 NOTE — ED Triage Notes (Signed)
Pt complains of right knee pain since falling on right knee 1 hour prior to checking in.

## 2020-12-23 NOTE — ED Provider Notes (Signed)
[facility]-EMERGENCY DEPT Provider Note   CSN: [id number] Arrival date & time: 12/23/20  9323     History Chief Complaint  Patient presents with  . Knee Injury    Judy Ballard is a 20 y.o. female.  20 year old female presents after sustaining direct trauma to her right knee just prior to arrival.  States he tripped and fell.  No other injuries noted.  Has had sharp pain to her right knee that is worse with walking.  No treatment use prior to arrival        Past Medical History:  Diagnosis Date  . Tonsillitis   . UTI (urinary tract infection)     Patient Active Problem List   Diagnosis Date Noted  . Gestational hypertension 08/13/2020  . Spontaneous vaginal delivery 08/13/2020  . S/P tonsillectomy 07/16/2019  . Right knee injury, initial encounter 08/10/2017    Past Surgical History:  Procedure Laterality Date  . TONSILLECTOMY Bilateral 07/16/2019   Procedure: TONSILLECTOMY;  Surgeon: Serena Colonel, MD;  Location: [facility];  Service: ENT;  Laterality: Bilateral;  . UMBILICAL HERNIA REPAIR       OB History    Gravida  1   Para  1   Term  1   Preterm      AB      Living  1     SAB      IAB      Ectopic      Multiple  0   Live Births  1           No family history on file.  Social History   Tobacco Use  . Smoking status: Never Smoker  . Smokeless tobacco: Never Used  Vaping Use  . Vaping Use: Never used  Substance Use Topics  . Alcohol use: No  . Drug use: Not Currently    Home Medications Prior to Admission medications   Medication Sig Start Date End Date Taking? Authorizing Provider  acetaminophen (TYLENOL) 325 MG tablet Take 2 tablets (650 mg total) by mouth every 4 (four) hours as needed (for pain scale < 4). 08/15/20   Charlett Nose, MD  ibuprofen (ADVIL) 600 MG tablet Take 1 tablet (600 mg total) by mouth every 6 (six) hours. 08/15/20   Charlett Nose, MD  NIFEdipine  (ADALAT CC) 30 MG 24 hr tablet Take 2 tablets (60 mg total) by mouth daily. 08/16/20   Charlett Nose, MD  Prenatal Vit-Fe Fumarate-FA (PRENATAL MULTIVITAMIN) TABS tablet Take 1 tablet by mouth daily at 12 noon.    [provider]    Allergies    Apple and Fish allergy  Review of Systems   Review of Systems  All other systems reviewed and are negative.   Physical Exam Updated Vital Signs BP 126/77 (BP Location: Right Arm)   Pulse 83   Temp 98.2 F (36.8 C) (Oral)   Resp 16   Ht 1.575 m (5\' 2" )   Wt 81.6 kg   LMP 12/05/2020   SpO2 100%   BMI 32.92 kg/m   Physical Exam Vitals and nursing note reviewed.  Constitutional:      Appearance: She is well-developed. She is not toxic-appearing.  HENT:     Head: Normocephalic and atraumatic.  Eyes:     Conjunctiva/sclera: Conjunctivae normal.     Pupils: Pupils are equal, round, and reactive to light.  Cardiovascular:     Rate and Rhythm: Normal rate.  Pulmonary:  Effort: Pulmonary effort is normal.  Musculoskeletal:     Cervical back: Normal range of motion.     Right knee: No effusion. Decreased range of motion.  Skin:    General: Skin is warm and dry.  Neurological:     Mental Status: She is alert and oriented to person, place, and time.     ED Results / Procedures / Treatments   Labs (all labs ordered are listed, but only abnormal results are displayed) Labs Reviewed - No data to display  EKG None  Radiology DG Knee Complete 4 Views Right  Result Date: 12/23/2020 CLINICAL DATA:  20 year old with fall and right knee pain. EXAM: RIGHT KNEE - COMPLETE 4+ VIEW COMPARISON:  None. FINDINGS: No evidence of fracture, dislocation, or joint effusion. No evidence of arthropathy or other focal bone abnormality. Soft tissues are unremarkable. IMPRESSION: Negative. Electronically Signed   By: Richarda Overlie M.D.   On: 12/23/2020 09:51    Procedures Procedures   Medications Ordered in ED Medications - No data  to display  ED Course  I have reviewed the triage vital signs and the nursing notes.  Pertinent labs & imaging results that were available during my care of the patient were reviewed by me and considered in my medical decision making (see chart for details).    MDM Rules/Calculators/A&P                           Final Clinical Impression(s) / ED Diagnoses Final diagnoses:  None  X-rays are negative.  Will place patient knee sleeve and give Ortho referral  Rx / DC Orders ED Discharge Orders    None       Lorre Nick, MD 12/23/20 1002

## 2020-12-24 ENCOUNTER — Telehealth: Payer: Self-pay | Admitting: *Deleted

## 2020-12-24 NOTE — Telephone Encounter (Signed)
Transition Care Management Unsuccessful Follow-up Telephone Call  Date of discharge and from where:  12/23/2020 - [facility]  Attempts:  1st Attempt  Reason for unsuccessful TCM follow-up call:  Voice mail full

## 2020-12-25 NOTE — Telephone Encounter (Signed)
Transition Care Management Unsuccessful Follow-up Telephone Call  Date of discharge and from where:  12/23/2020 [facility]  Attempts:  2nd Attempt  Reason for unsuccessful TCM follow-up call:  Voice mail full

## 2020-12-26 NOTE — Telephone Encounter (Signed)
Transition Care Management Unsuccessful Follow-up Telephone Call  Date of discharge and from where:  12/23/2020 - [facility]  Attempts:  3rd Attempt  Reason for unsuccessful TCM follow-up call:  Voice mail full

## 2021-03-21 ENCOUNTER — Emergency Department (HOSPITAL_COMMUNITY): Payer: Medicaid Other

## 2021-03-21 ENCOUNTER — Other Ambulatory Visit: Payer: Self-pay

## 2021-03-21 ENCOUNTER — Emergency Department (HOSPITAL_COMMUNITY)
Admission: EM | Admit: 2021-03-21 | Discharge: 2021-03-21 | Disposition: A | Discharge status: Home / Self Care | Payer: Medicaid Other | Attending: Emergency Medicine | Admitting: Emergency Medicine

## 2021-03-21 ENCOUNTER — Encounter (HOSPITAL_COMMUNITY): Payer: Self-pay

## 2021-03-21 DIAGNOSIS — B9689 Other specified bacterial agents as the cause of diseases classified elsewhere: Secondary | ICD-10-CM | POA: Diagnosis not present

## 2021-03-21 DIAGNOSIS — O23591 Infection of other part of genital tract in pregnancy, first trimester: Secondary | ICD-10-CM | POA: Diagnosis not present

## 2021-03-21 DIAGNOSIS — B379 Candidiasis, unspecified: Secondary | ICD-10-CM

## 2021-03-21 DIAGNOSIS — Z3A11 11 weeks gestation of pregnancy: Secondary | ICD-10-CM | POA: Insufficient documentation

## 2021-03-21 DIAGNOSIS — Z3490 Encounter for supervision of normal pregnancy, unspecified, unspecified trimester: Secondary | ICD-10-CM

## 2021-03-21 DIAGNOSIS — N390 Urinary tract infection, site not specified: Secondary | ICD-10-CM

## 2021-03-21 DIAGNOSIS — O98811 Other maternal infectious and parasitic diseases complicating pregnancy, first trimester: Secondary | ICD-10-CM | POA: Diagnosis not present

## 2021-03-21 DIAGNOSIS — O2341 Unspecified infection of urinary tract in pregnancy, first trimester: Secondary | ICD-10-CM | POA: Diagnosis not present

## 2021-03-21 DIAGNOSIS — N760 Acute vaginitis: Secondary | ICD-10-CM | POA: Diagnosis not present

## 2021-03-21 DIAGNOSIS — O26891 Other specified pregnancy related conditions, first trimester: Secondary | ICD-10-CM | POA: Diagnosis not present

## 2021-03-21 DIAGNOSIS — R102 Pelvic and perineal pain: Secondary | ICD-10-CM | POA: Insufficient documentation

## 2021-03-21 LAB — CBC WITH DIFFERENTIAL/PLATELET
Abs Immature Granulocytes: 0.02 10*3/uL (ref 0.00–0.07)
Basophils Absolute: 0 10*3/uL (ref 0.0–0.1)
Basophils Relative: 0 %
Eosinophils Absolute: 0.1 10*3/uL (ref 0.0–0.5)
Eosinophils Relative: 1 %
HCT: 32.7 % — ABNORMAL LOW (ref 36.0–46.0)
Hemoglobin: 10.7 g/dL — ABNORMAL LOW (ref 12.0–15.0)
Immature Granulocytes: 0 %
Lymphocytes Relative: 17 %
Lymphs Abs: 1.1 10*3/uL (ref 0.7–4.0)
MCH: 26.2 pg (ref 26.0–34.0)
MCHC: 32.7 g/dL (ref 30.0–36.0)
MCV: 80.1 fL (ref 80.0–100.0)
Monocytes Absolute: 0.6 10*3/uL (ref 0.1–1.0)
Monocytes Relative: 9 %
Neutro Abs: 4.7 10*3/uL (ref 1.7–7.7)
Neutrophils Relative %: 73 %
Platelets: 229 10*3/uL (ref 150–400)
RBC: 4.08 MIL/uL (ref 3.87–5.11)
RDW: 17.4 % — ABNORMAL HIGH (ref 11.5–15.5)
WBC: 6.5 10*3/uL (ref 4.0–10.5)
nRBC: 0 % (ref 0.0–0.2)

## 2021-03-21 LAB — URINALYSIS, ROUTINE W REFLEX MICROSCOPIC
RBC / HPF: >50 RBC/hpf — ABNORMAL HIGH (ref 0–5)
WBC, UA: >50 WBC/hpf — ABNORMAL HIGH (ref 0–5)

## 2021-03-21 LAB — HCG, QUANTITATIVE, PREGNANCY: hCG, Beta Chain, Quant, S: 224463 m[IU]/mL — ABNORMAL HIGH (ref <5)

## 2021-03-21 LAB — BASIC METABOLIC PANEL
Anion gap: 5 (ref 5–15)
BUN: 6 mg/dL (ref 6–20)
CO2: 23 mmol/L (ref 22–32)
Calcium: 8.9 mg/dL (ref 8.9–10.3)
Chloride: 105 mmol/L (ref 98–111)
Creatinine, Ser: 0.51 mg/dL (ref 0.44–1.00)
GFR, Estimated: >60 mL/min (ref >60)
Glucose, Bld: 94 mg/dL (ref 70–99)
Potassium: 3.6 mmol/L (ref 3.5–5.1)
Sodium: 133 mmol/L — ABNORMAL LOW (ref 135–145)

## 2021-03-21 LAB — WET PREP, GENITAL
Sperm: NONE SEEN
Trich, Wet Prep: NONE SEEN
Yeast Wet Prep HPF POC: NONE SEEN

## 2021-03-21 MED ORDER — CEPHALEXIN 500 MG PO CAPS: 500.0000 mg | ORAL_CAPSULE | Freq: Four times a day (QID) | ORAL | 0 refills | Status: AC

## 2021-03-21 MED ORDER — METRONIDAZOLE 500 MG PO TABS: 500.0000 mg | ORAL_TABLET | Freq: Two times a day (BID) | ORAL | 0 refills | Status: DC

## 2021-03-21 MED ORDER — CLOTRIMAZOLE 1 % VA CREA: 1.0000 | TOPICAL_CREAM | Freq: Every day | VAGINAL | 0 refills | Status: AC

## 2021-03-21 NOTE — ED Notes (Signed)
Pt d/c home per MD order. Discharge summary reviewed with pt, pt verbalizes understanding. No s/s of acute distress noted at discharge. Ambulatory.  

## 2021-03-21 NOTE — ED Triage Notes (Signed)
Pt reports pelvic pain for 2-3 weeks. Pt also endorses hematuria that began about 3-4 weeks ago. Pt denies abnormal vaginal discharge. Pt unsure of when her last period was.

## 2021-03-21 NOTE — ED Provider Notes (Signed)
[facility]-EMERGENCY DEPT Provider Note   CSN: [id number] Arrival date & time: 03/21/21  1404     History Chief Complaint  Patient presents with   Hematuria   Pelvic Pain    [facility] is a 20 y.o. female who presents to the ED today with complaint of gradual onset, constant, achy, pelvic abdominal pain that began 2-3 weeks ago. Pt also complains of hematuria that she noticed 2-3 days ago. She reports she believes she is ~ [redacted] weeks pregnant. She states she took 2 at home pregnancy tests and they were both positive. She reports her LNMP was "sometime in June." She complains of morning sickness as well. She reports this will be her second pregnancy. No complications with the first. Pt denies fevers, chills, upper abdominal pain, dysuria, urinary frequency, vaginal bleeding, vaginal discharge, or any other associated symptoms.   The history is provided by the patient and medical records.      Past Medical History:  Diagnosis Date   Tonsillitis    UTI (urinary tract infection)     Patient Active Problem List   Diagnosis Date Noted   Gestational hypertension 08/13/2020   Spontaneous vaginal delivery 08/13/2020   S/P tonsillectomy 07/16/2019   Right knee injury, initial encounter 08/10/2017    Past Surgical History:  Procedure Laterality Date   TONSILLECTOMY Bilateral 07/16/2019   Procedure: TONSILLECTOMY;  Surgeon: Serena Colonelosen, Jefry, MD;  Location: [facility];  Service: ENT;  Laterality: Bilateral;   UMBILICAL HERNIA REPAIR       OB History     Gravida  1   Para  1   Term  1   Preterm      AB      Living  1      SAB      IAB      Ectopic      Multiple  0   Live Births  1           History reviewed. No pertinent family history.  Social History   Tobacco Use   Smoking status: Never   Smokeless tobacco: Never  Vaping Use   Vaping Use: Never used  Substance Use Topics   Alcohol use: No   Drug use: Not  Currently    Home Medications Prior to Admission medications   Medication Sig Start Date End Date Taking? Authorizing Provider  cephALEXin (KEFLEX) 500 MG capsule Take 1 capsule (500 mg total) by mouth 4 (four) times daily for 7 days. 03/21/21 03/28/21 Yes Intisar Claudio, PA-C  clotrimazole (GYNE-LOTRIMIN) 1 % vaginal cream Place 1 Applicatorful vaginally at bedtime for 7 days. 03/21/21 03/28/21 Yes Helina Hullum, PA-C  metroNIDAZOLE (FLAGYL) 500 MG tablet Take 1 tablet (500 mg total) by mouth 2 (two) times daily. 03/21/21  Yes Bekim Werntz, PA-C  acetaminophen (TYLENOL) 325 MG tablet Take 2 tablets (650 mg total) by mouth every 4 (four) hours as needed (for pain scale < 4). Patient not taking: No sig reported 08/15/20   Charlett NoseMarinone, Michelle E, MD  ibuprofen (ADVIL) 600 MG tablet Take 1 tablet (600 mg total) by mouth every 6 (six) hours. Patient not taking: No sig reported 08/15/20   Charlett NoseMarinone, Michelle E, MD  NIFEdipine (ADALAT CC) 30 MG 24 hr tablet Take 2 tablets (60 mg total) by mouth daily. Patient not taking: No sig reported 08/16/20   Charlett NoseMarinone, Michelle E, MD  Prenatal Vit-Fe Fumarate-FA (PRENATAL MULTIVITAMIN) TABS tablet Take 1 tablet by mouth daily  at 12 noon. Patient not taking: No sig reported    [provider]    Allergies    Apple and Fish allergy  Review of Systems   Review of Systems  Constitutional:  Negative for chills and fever.  Gastrointestinal:  Positive for abdominal pain, nausea and vomiting. Negative for constipation and diarrhea.  Genitourinary:  Positive for hematuria and pelvic pain. Negative for difficulty urinating, dysuria, flank pain, frequency, vaginal bleeding and vaginal discharge.  All other systems reviewed and are negative.  Physical Exam Updated Vital Signs BP 114/78   Pulse 74   Temp 97.8 F (36.6 C)   Resp 18   SpO2 100%   Physical Exam Vitals and nursing note reviewed.  Constitutional:      Appearance: She is not ill-appearing or  diaphoretic.  HENT:     Head: Normocephalic and atraumatic.  Eyes:     Conjunctiva/sclera: Conjunctivae normal.  Cardiovascular:     Rate and Rhythm: Normal rate and regular rhythm.     Pulses: Normal pulses.  Pulmonary:     Effort: Pulmonary effort is normal.     Breath sounds: Normal breath sounds. No wheezing, rhonchi or rales.  Abdominal:     General: There is no distension.     Palpations: Abdomen is soft.     Tenderness: There is abdominal tenderness. There is no guarding or rebound.     Comments: Soft, mild suprapubic abdominal TTP, +BS throughout, no r/g/r, neg murphy's, neg mcburney's, no CVA TTP  Musculoskeletal:     Cervical back: Neck supple.  Skin:    General: Skin is warm and dry.  Neurological:     Mental Status: She is alert.    ED Results / Procedures / Treatments   Labs (all labs ordered are listed, but only abnormal results are displayed) Labs Reviewed  WET PREP, GENITAL - Abnormal; Notable for the following components:      Result Value   Clue Cells Wet Prep HPF POC PRESENT (*)    WBC, Wet Prep HPF POC PRESENT (*)    All other components within normal limits  URINALYSIS, ROUTINE W REFLEX MICROSCOPIC - Abnormal; Notable for the following components:   APPearance TURBID (*)    Glucose, UA   (*)    Value: TEST NOT REPORTED DUE TO COLOR INTERFERENCE OF URINE PIGMENT   Hgb urine dipstick   (*)    Value: TEST NOT REPORTED DUE TO COLOR INTERFERENCE OF URINE PIGMENT   Bilirubin Urine   (*)    Value: TEST NOT REPORTED DUE TO COLOR INTERFERENCE OF URINE PIGMENT   Ketones, ur   (*)    Value: TEST NOT REPORTED DUE TO COLOR INTERFERENCE OF URINE PIGMENT   Protein, ur   (*)    Value: TEST NOT REPORTED DUE TO COLOR INTERFERENCE OF URINE PIGMENT   Nitrite   (*)    Value: TEST NOT REPORTED DUE TO COLOR INTERFERENCE OF URINE PIGMENT   Leukocytes,Ua   (*)    Value: TEST NOT REPORTED DUE TO COLOR INTERFERENCE OF URINE PIGMENT   RBC / HPF >50 (*)    WBC, UA >50 (*)     Bacteria, UA MANY (*)    All other components within normal limits  CBC WITH DIFFERENTIAL/PLATELET - Abnormal; Notable for the following components:   Hemoglobin 10.7 (*)    HCT 32.7 (*)    RDW 17.4 (*)    All other components within normal limits  BASIC METABOLIC PANEL -  Abnormal; Notable for the following components:   Sodium 133 (*)    All other components within normal limits  HCG, QUANTITATIVE, PREGNANCY - Abnormal; Notable for the following components:   hCG, Beta Chain, Quant, S 053,976 (*)    All other components within normal limits  URINE CULTURE  I-STAT BETA HCG BLOOD, ED ([facility], [facility], [facility] ONLY)  GC/CHLAMYDIA PROBE AMP ([place]) NOT AT [facility]    EKG None  Radiology US OB LESS THAN 14 WEEKS WITH OB TRANSVAGINAL  Result Date: 03/21/2021 CLINICAL DATA:  Pelvic pain for 2 weeks. EXAM: OBSTETRIC <14 WK [place] AND TRANSVAGINAL OB US TECHNIQUE: Both transabdominal and transvaginal ultrasound examinations were performed for complete evaluation of the gestation as well as the maternal uterus, adnexal regions, and pelvic cul-de-sac. Transvaginal technique was performed to assess early pregnancy. COMPARISON:  None. FINDINGS: Intrauterine gestational sac: Single Yolk sac:  Visualized. Embryo:  Visualized. Cardiac Activity: Visualized. Heart Rate: 155 bpm CRL:  47.0 mm   11 w   4 d                  [place] EDC: October 06, 2021 Subchorionic hemorrhage:  None visualized. Maternal uterus/adnexae: The bilateral ovaries are visualized and are normal in appearance. No pelvic free fluid is seen. IMPRESSION: Single, viable intrauterine pregnancy at approximately 11 weeks and 4 days gestation by ultrasound evaluation. Electronically Signed   By: Aram Candela M.D.   On: 03/21/2021 19:21    Procedures Procedures   Medications Ordered in ED Medications - No data to display  ED Course  I have reviewed the triage vital signs and the nursing notes.  Pertinent labs & imaging results that were available  during my care of the patient were reviewed by me and considered in my medical decision making (see chart for details).    MDM Rules/Calculators/A&P                           20 year old female who presents to the ED today with complaints of pelvic pain for the past 2 to 3 weeks with associated hematuria for the past 2 to 3 days.  She states she is possibly pregnant with last normal menstrual period sometime in June.  On arrival to the ED today patient is afebrile, nontachycardic nontachypneic and appears to be in no acute distress.  She is noted to have some suprapubic abdominal tenderness palpation on exam.  We will plan for pelvic exam at this time given complaints of pelvic pain.  Will obtain beta-hCG as well as urinalysis and lab work given she has had a emesis as well to assess for any electrolyte abnormalities.  Patient will require pelvic ultrasound for further evaluation given she is pregnant and having pain.  Does however deny any vaginal bleeding to suggest ectopic pregnancy.  CBC without leukocytosis. Hgb stable at 10.7 BMP without electrolyte abnormalities Hcg quant 224,463  Ultrasound: IMPRESSION:  Single, viable intrauterine pregnancy at approximately 11 weeks and  4 days gestation by ultrasound evaluation.   Pelvic exam performed with thick white discharge in vault. Wet prep has returned positive for clue cells however exam does seem more consistent with yeast.  U/A with > 50 RBCs and > 50 wbcs with many bacteria, WBC clumps, and budding yeast   Will treat for UTI, yeast (seen on U/A), and BV at this time. Pt instructed to follow up with OBGYN for same. She is stable for discharge home.  This note was prepared using Dragon voice recognition software and may include unintentional dictation errors due to the inherent limitations of voice recognition software.  Final Clinical Impression(s) / ED Diagnoses Final diagnoses:  Intrauterine pregnancy  Lower urinary tract  infectious disease  BV (bacterial vaginosis)  Yeast infection    Rx / DC Orders ED Discharge Orders          Ordered    cephALEXin (KEFLEX) 500 MG capsule  4 times daily        03/21/21 2115    clotrimazole (GYNE-LOTRIMIN) 1 % vaginal cream  Daily at bedtime        03/21/21 2115    metroNIDAZOLE (FLAGYL) 500 MG tablet  2 times daily        03/21/21 2115             Discharge Instructions      Please pick up medications and take as prescribed to cover for your urinary tract infection, bacterial vaginosis, and yeast infection  Follow up with your OBGYN regarding ED visit. If you do not have an OBGYN you can follow up with [facility] for prenatal care  Please have your urine rechecked in 1-2 weeks to ensure you have cleared your infection  Go directly to the MAU For any new/worsening symptoms       Tanda Rockers, PA-C 03/21/21 2117    Alvira Monday, MD 03/23/21 1505

## 2021-03-21 NOTE — Discharge Instructions (Addendum)
Please pick up medications and take as prescribed to cover for your urinary tract infection, bacterial vaginosis, and yeast infection  Follow up with your OBGYN regarding ED visit. If you do not have an OBGYN you can follow up with [facility] for prenatal care  Please have your urine rechecked in 1-2 weeks to ensure you have cleared your infection  Go directly to the MAU For any new/worsening symptoms

## 2021-03-23 ENCOUNTER — Telehealth: Payer: Self-pay | Admitting: *Deleted

## 2021-03-23 LAB — GC/CHLAMYDIA PROBE AMP (~~LOC~~) NOT AT ARMC
Chlamydia: NEGATIVE
Comment: NEGATIVE
Comment: NORMAL
Neisseria Gonorrhea: NEGATIVE

## 2021-03-23 NOTE — Telephone Encounter (Signed)
Transition Care Management Unsuccessful Follow-up Telephone Call  Date of discharge and from where:  03/21/2021 [facility]  Attempts:  1st Attempt  Reason for unsuccessful TCM follow-up call:  Left voice message

## 2021-03-24 LAB — URINE CULTURE: Culture: >=100000 — AB

## 2021-03-24 NOTE — Telephone Encounter (Signed)
Transition Care Management Follow-up Telephone Call Date of discharge and from where: 03/21/2021 - [facility] How have you been since you were released from the hospital? "I am fine" Any questions or concerns? No  Items Reviewed: Did the pt receive and understand the discharge instructions provided? Yes  Medications obtained and verified? Yes  Other? No  Any new allergies since your discharge? No  Dietary orders reviewed? No Do you have support at home? Yes    Functional Questionnaire: (I = Independent and D = Dependent) ADLs: I  Bathing/Dressing- I  Meal Prep- I  Eating- I  Maintaining continence- I  Transferring/Ambulation- I  Managing Meds- I  Follow up appointments reviewed:  PCP Hospital f/u appt confirmed? No   Specialist Hospital f/u appt confirmed? No   Are transportation arrangements needed? No  If their condition worsens, is the pt aware to call PCP or go to the Emergency Dept.? Yes Was the patient provided with contact information for the PCP's office or ED? Yes Was to pt encouraged to call back with questions or concerns? Yes

## 2021-03-25 ENCOUNTER — Telehealth: Payer: Self-pay | Admitting: *Deleted

## 2021-03-25 NOTE — Telephone Encounter (Signed)
Post ED Visit - Positive Culture Follow-up  Culture report reviewed by antimicrobial stewardship pharmacist: [facility] []  , Pharm.D. []  Enzo Bi, Pharm.D., BCPS AQ-ID []  , Pharm.D., BCPS []  Celedonio Miyamoto, Pharm.D., BCPS []  [place], Garvin Fila.D., BCPS, AAHIVP []  , Pharm.D., BCPS, AAHIVP []  Georgina Pillion, PharmD, BCPS []  , PharmD, BCPS []  [place], PharmD, BCPS []  [place], PharmD []  , PharmD, BCPS []  Estella Husk, PharmD  Pharmacy Team []  Lysle Pearl, PharmD []  , PharmD []  Phillips Climes, PharmD []  , Rph []  Agapito Games) , PharmD []  Verlan Friends, PharmD []  , PharmD []  Mervyn Gay, PharmD []  , PharmD []  Vinnie Level, PharmD []  [facility], PharmD []  , PharmD []  Len Childs, PharmD   Positive urine culture Treated with Cephalexin, organism sensitive to the same and no further patient follow-up is required at this time.  , PharmD  Greer Pickerel Talley 03/25/2021, 9:35 AM

## 2021-05-25 DIAGNOSIS — Z8759 Personal history of other complications of pregnancy, childbirth and the puerperium: Secondary | ICD-10-CM | POA: Diagnosis not present

## 2021-05-25 DIAGNOSIS — O99342 Other mental disorders complicating pregnancy, second trimester: Secondary | ICD-10-CM | POA: Diagnosis not present

## 2021-05-25 DIAGNOSIS — Z3A20 20 weeks gestation of pregnancy: Secondary | ICD-10-CM | POA: Diagnosis not present

## 2021-05-25 DIAGNOSIS — Z113 Encounter for screening for infections with a predominantly sexual mode of transmission: Secondary | ICD-10-CM | POA: Diagnosis not present

## 2021-05-25 DIAGNOSIS — F32A Depression, unspecified: Secondary | ICD-10-CM | POA: Diagnosis not present

## 2021-05-25 DIAGNOSIS — O0932 Supervision of pregnancy with insufficient antenatal care, second trimester: Secondary | ICD-10-CM | POA: Diagnosis not present

## 2021-05-25 LAB — OB RESULTS CONSOLE RUBELLA ANTIBODY, IGM: Rubella: IMMUNE

## 2021-05-25 LAB — OB RESULTS CONSOLE HEPATITIS B SURFACE ANTIGEN: Hepatitis B Surface Ag: NEGATIVE

## 2021-08-06 LAB — OB RESULTS CONSOLE GBS: GBS: NEGATIVE

## 2021-08-13 DIAGNOSIS — Z124 Encounter for screening for malignant neoplasm of cervix: Secondary | ICD-10-CM | POA: Diagnosis not present

## 2021-08-13 DIAGNOSIS — Z3483 Encounter for supervision of other normal pregnancy, third trimester: Secondary | ICD-10-CM | POA: Diagnosis not present

## 2021-08-13 DIAGNOSIS — R87610 Atypical squamous cells of undetermined significance on cytologic smear of cervix (ASC-US): Secondary | ICD-10-CM | POA: Diagnosis not present

## 2021-08-13 DIAGNOSIS — O0933 Supervision of pregnancy with insufficient antenatal care, third trimester: Secondary | ICD-10-CM | POA: Diagnosis not present

## 2021-08-13 DIAGNOSIS — Z113 Encounter for screening for infections with a predominantly sexual mode of transmission: Secondary | ICD-10-CM | POA: Diagnosis not present

## 2021-08-13 DIAGNOSIS — Z369 Encounter for antenatal screening, unspecified: Secondary | ICD-10-CM | POA: Diagnosis not present

## 2021-08-13 DIAGNOSIS — O09293 Supervision of pregnancy with other poor reproductive or obstetric history, third trimester: Secondary | ICD-10-CM | POA: Diagnosis not present

## 2021-08-13 DIAGNOSIS — Z8759 Personal history of other complications of pregnancy, childbirth and the puerperium: Secondary | ICD-10-CM | POA: Diagnosis not present

## 2021-08-13 DIAGNOSIS — Z23 Encounter for immunization: Secondary | ICD-10-CM | POA: Diagnosis not present

## 2021-08-13 DIAGNOSIS — Z8632 Personal history of gestational diabetes: Secondary | ICD-10-CM | POA: Diagnosis not present

## 2021-08-13 LAB — OB RESULTS CONSOLE HIV ANTIBODY (ROUTINE TESTING): HIV: NONREACTIVE

## 2021-08-13 LAB — OB RESULTS CONSOLE RPR: RPR: NONREACTIVE

## 2021-08-14 DIAGNOSIS — F32A Depression, unspecified: Secondary | ICD-10-CM | POA: Insufficient documentation

## 2021-08-14 DIAGNOSIS — O99343 Other mental disorders complicating pregnancy, third trimester: Secondary | ICD-10-CM | POA: Insufficient documentation

## 2021-08-17 DIAGNOSIS — O09293 Supervision of pregnancy with other poor reproductive or obstetric history, third trimester: Secondary | ICD-10-CM | POA: Diagnosis not present

## 2021-08-17 DIAGNOSIS — O09893 Supervision of other high risk pregnancies, third trimester: Secondary | ICD-10-CM | POA: Diagnosis not present

## 2021-08-17 DIAGNOSIS — Z3689 Encounter for other specified antenatal screening: Secondary | ICD-10-CM | POA: Diagnosis not present

## 2021-08-17 DIAGNOSIS — Z8632 Personal history of gestational diabetes: Secondary | ICD-10-CM | POA: Diagnosis not present

## 2021-08-17 DIAGNOSIS — O99213 Obesity complicating pregnancy, third trimester: Secondary | ICD-10-CM | POA: Diagnosis not present

## 2021-08-17 DIAGNOSIS — O0933 Supervision of pregnancy with insufficient antenatal care, third trimester: Secondary | ICD-10-CM | POA: Diagnosis not present

## 2021-08-17 DIAGNOSIS — Z8759 Personal history of other complications of pregnancy, childbirth and the puerperium: Secondary | ICD-10-CM | POA: Diagnosis not present

## 2021-08-20 DIAGNOSIS — O99013 Anemia complicating pregnancy, third trimester: Secondary | ICD-10-CM | POA: Diagnosis not present

## 2021-08-20 DIAGNOSIS — D509 Iron deficiency anemia, unspecified: Secondary | ICD-10-CM | POA: Diagnosis not present

## 2021-08-20 DIAGNOSIS — Z3A33 33 weeks gestation of pregnancy: Secondary | ICD-10-CM | POA: Diagnosis not present

## 2021-09-10 ENCOUNTER — Inpatient Hospital Stay (HOSPITAL_COMMUNITY)
Admission: AD | Admit: 2021-09-10 | Discharge: 2021-09-12 | DRG: 832 | Disposition: A | Discharge status: Home / Self Care | Payer: Medicaid Other | Attending: Obstetrics & Gynecology | Admitting: Obstetrics & Gynecology

## 2021-09-10 ENCOUNTER — Inpatient Hospital Stay (HOSPITAL_COMMUNITY): Payer: Medicaid Other

## 2021-09-10 ENCOUNTER — Other Ambulatory Visit: Payer: Self-pay

## 2021-09-10 ENCOUNTER — Encounter (HOSPITAL_COMMUNITY): Payer: Self-pay | Admitting: Obstetrics & Gynecology

## 2021-09-10 DIAGNOSIS — O2300 Infections of kidney in pregnancy, unspecified trimester: Secondary | ICD-10-CM

## 2021-09-10 DIAGNOSIS — O09893 Supervision of other high risk pregnancies, third trimester: Secondary | ICD-10-CM

## 2021-09-10 DIAGNOSIS — Z20822 Contact with and (suspected) exposure to covid-19: Secondary | ICD-10-CM | POA: Diagnosis not present

## 2021-09-10 DIAGNOSIS — N1330 Unspecified hydronephrosis: Principal | ICD-10-CM | POA: Diagnosis present

## 2021-09-10 DIAGNOSIS — R112 Nausea with vomiting, unspecified: Secondary | ICD-10-CM | POA: Diagnosis not present

## 2021-09-10 DIAGNOSIS — O0930 Supervision of pregnancy with insufficient antenatal care, unspecified trimester: Secondary | ICD-10-CM | POA: Diagnosis not present

## 2021-09-10 DIAGNOSIS — K8020 Calculus of gallbladder without cholecystitis without obstruction: Secondary | ICD-10-CM | POA: Diagnosis present

## 2021-09-10 DIAGNOSIS — O26613 Liver and biliary tract disorders in pregnancy, third trimester: Secondary | ICD-10-CM | POA: Diagnosis not present

## 2021-09-10 DIAGNOSIS — K219 Gastro-esophageal reflux disease without esophagitis: Secondary | ICD-10-CM | POA: Diagnosis present

## 2021-09-10 DIAGNOSIS — O99013 Anemia complicating pregnancy, third trimester: Secondary | ICD-10-CM | POA: Diagnosis not present

## 2021-09-10 DIAGNOSIS — O2303 Infections of kidney in pregnancy, third trimester: Secondary | ICD-10-CM | POA: Diagnosis not present

## 2021-09-10 DIAGNOSIS — Z3A36 36 weeks gestation of pregnancy: Secondary | ICD-10-CM

## 2021-09-10 DIAGNOSIS — O99613 Diseases of the digestive system complicating pregnancy, third trimester: Secondary | ICD-10-CM | POA: Diagnosis not present

## 2021-09-10 DIAGNOSIS — O212 Late vomiting of pregnancy: Secondary | ICD-10-CM | POA: Diagnosis not present

## 2021-09-10 DIAGNOSIS — O99891 Other specified diseases and conditions complicating pregnancy: Secondary | ICD-10-CM | POA: Diagnosis present

## 2021-09-10 DIAGNOSIS — O2341 Unspecified infection of urinary tract in pregnancy, first trimester: Secondary | ICD-10-CM

## 2021-09-10 HISTORY — DX: Unspecified infection of urinary tract in pregnancy, first trimester: O23.41

## 2021-09-10 HISTORY — DX: Infections of kidney in pregnancy, unspecified trimester: O23.00

## 2021-09-10 LAB — COMPREHENSIVE METABOLIC PANEL
ALT: 7 U/L (ref 0–44)
AST: 17 U/L (ref 15–41)
Albumin: 2.5 g/dL — ABNORMAL LOW (ref 3.5–5.0)
Alkaline Phosphatase: 163 U/L — ABNORMAL HIGH (ref 38–126)
Anion gap: 10 (ref 5–15)
BUN: <5 mg/dL — ABNORMAL LOW (ref 6–20)
CO2: 20 mmol/L — ABNORMAL LOW (ref 22–32)
Calcium: 8.8 mg/dL — ABNORMAL LOW (ref 8.9–10.3)
Chloride: 108 mmol/L (ref 98–111)
Creatinine, Ser: 0.57 mg/dL (ref 0.44–1.00)
GFR, Estimated: >60 mL/min (ref >60)
Glucose, Bld: 79 mg/dL (ref 70–99)
Potassium: 3.7 mmol/L (ref 3.5–5.1)
Sodium: 138 mmol/L (ref 135–145)
Total Bilirubin: 1.1 mg/dL (ref 0.3–1.2)
Total Protein: 6.2 g/dL — ABNORMAL LOW (ref 6.5–8.1)

## 2021-09-10 LAB — URINALYSIS, ROUTINE W REFLEX MICROSCOPIC
Bilirubin Urine: NEGATIVE
Glucose, UA: NEGATIVE mg/dL
Hgb urine dipstick: NEGATIVE
Ketones, ur: 80 mg/dL — AB
Nitrite: POSITIVE — AB
Protein, ur: NEGATIVE mg/dL
Specific Gravity, Urine: 1.011 (ref 1.005–1.030)
WBC, UA: >50 WBC/hpf — ABNORMAL HIGH (ref 0–5)
pH: 6 (ref 5.0–8.0)

## 2021-09-10 LAB — CBC WITH DIFFERENTIAL/PLATELET
Abs Immature Granulocytes: 0.07 10*3/uL (ref 0.00–0.07)
Basophils Absolute: 0 10*3/uL (ref 0.0–0.1)
Basophils Relative: 0 %
Eosinophils Absolute: 0 10*3/uL (ref 0.0–0.5)
Eosinophils Relative: 0 %
HCT: 29.5 % — ABNORMAL LOW (ref 36.0–46.0)
Hemoglobin: 9.2 g/dL — ABNORMAL LOW (ref 12.0–15.0)
Immature Granulocytes: 1 %
Lymphocytes Relative: 9 %
Lymphs Abs: 0.7 10*3/uL (ref 0.7–4.0)
MCH: 24.5 pg — ABNORMAL LOW (ref 26.0–34.0)
MCHC: 31.2 g/dL (ref 30.0–36.0)
MCV: 78.7 fL — ABNORMAL LOW (ref 80.0–100.0)
Monocytes Absolute: 0.5 10*3/uL (ref 0.1–1.0)
Monocytes Relative: 6 %
Neutro Abs: 6.5 10*3/uL (ref 1.7–7.7)
Neutrophils Relative %: 84 %
Platelets: 259 10*3/uL (ref 150–400)
RBC: 3.75 MIL/uL — ABNORMAL LOW (ref 3.87–5.11)
RDW: 25 % — ABNORMAL HIGH (ref 11.5–15.5)
WBC: 7.8 10*3/uL (ref 4.0–10.5)
nRBC: 0 % (ref 0.0–0.2)

## 2021-09-10 LAB — TYPE AND SCREEN
ABO/RH(D): O POS
Antibody Screen: NEGATIVE

## 2021-09-10 LAB — RESP PANEL BY RT-PCR (FLU A&B, COVID) ARPGX2
Influenza A by PCR: NEGATIVE
Influenza B by PCR: NEGATIVE
SARS Coronavirus 2 by RT PCR: NEGATIVE

## 2021-09-10 LAB — LIPASE, BLOOD: Lipase: 19 U/L (ref 11–51)

## 2021-09-10 MED ORDER — ONDANSETRON HCL 4 MG/2ML IJ SOLN
4.0000 mg | Freq: Once | INTRAMUSCULAR | Status: AC
Start: 2021-09-10 — End: 2021-09-10
  Administered 2021-09-10: 4 mg via INTRAVENOUS
  Filled 2021-09-10: qty 2

## 2021-09-10 MED ORDER — FAMOTIDINE IN NACL 20-0.9 MG/50ML-% IV SOLN
20.0000 mg | Freq: Two times a day (BID) | INTRAVENOUS | Status: DC
  Administered 2021-09-10 – 2021-09-11 (×2): 20 mg via INTRAVENOUS
  Filled 2021-09-10 (×2): qty 50

## 2021-09-10 MED ORDER — SODIUM CHLORIDE 0.9 % IV SOLN: INTRAVENOUS | Status: AC

## 2021-09-10 MED ORDER — ACETAMINOPHEN 325 MG PO TABS
650.0000 mg | ORAL_TABLET | ORAL | Status: DC | PRN
  Administered 2021-09-11: 650 mg via ORAL
  Filled 2021-09-10: qty 2

## 2021-09-10 MED ORDER — TAMSULOSIN HCL 0.4 MG PO CAPS
0.4000 mg | ORAL_CAPSULE | Freq: Every day | ORAL | Status: DC
  Administered 2021-09-10 – 2021-09-12 (×3): 0.4 mg via ORAL
  Filled 2021-09-10 (×3): qty 1

## 2021-09-10 MED ORDER — FAMOTIDINE IN NACL 20-0.9 MG/50ML-% IV SOLN
20.0000 mg | Freq: Once | INTRAVENOUS | Status: AC
  Administered 2021-09-10: 20 mg via INTRAVENOUS
  Filled 2021-09-10: qty 50

## 2021-09-10 MED ORDER — SODIUM CHLORIDE 0.9 % IV SOLN
25.0000 mg | Freq: Four times a day (QID) | INTRAVENOUS | Status: DC | PRN
  Filled 2021-09-10 (×2): qty 1

## 2021-09-10 MED ORDER — ONDANSETRON 4 MG PO TBDP: 4.0000 mg | ORAL_TABLET | Freq: Three times a day (TID) | ORAL | Status: DC | PRN

## 2021-09-10 MED ORDER — LACTATED RINGERS IV BOLUS
1000.0000 mL | Freq: Once | INTRAVENOUS | Status: AC
  Administered 2021-09-10: 1000 mL via INTRAVENOUS

## 2021-09-10 MED ORDER — OXYCODONE HCL 5 MG PO TABS: 5.0000 mg | ORAL_TABLET | ORAL | Status: DC | PRN

## 2021-09-10 MED ORDER — ONDANSETRON HCL 40 MG/20ML IJ SOLN
8.0000 mg | Freq: Once | INTRAMUSCULAR | Status: AC
  Administered 2021-09-10: 8 mg via INTRAVENOUS
  Filled 2021-09-10: qty 4

## 2021-09-10 MED ORDER — CALCIUM CARBONATE ANTACID 500 MG PO CHEW: 2.0000 | CHEWABLE_TABLET | ORAL | Status: DC | PRN

## 2021-09-10 MED ORDER — LACTATED RINGERS IV BOLUS
1000.0000 mL | Freq: Once | INTRAVENOUS | Status: AC
Start: 2021-09-10 — End: 2021-09-10
  Administered 2021-09-10: 1000 mL via INTRAVENOUS

## 2021-09-10 MED ORDER — SODIUM CHLORIDE 0.9 % IV SOLN
2.0000 g | INTRAVENOUS | Status: DC
  Administered 2021-09-10 – 2021-09-12 (×3): 2 g via INTRAVENOUS
  Filled 2021-09-10 (×4): qty 20

## 2021-09-10 NOTE — H&P (Signed)
Obstetrics & Gynecology H&P   Date of Admission: 09/10/2021   Requesting Provider: Maternity Admissions Unit  Primary OBGYN: [facility] Primary Care Provider: Pcp, No  Reason for Admission: right pyelo and/or stone, n/v, gallstones  History of Present Illness: [facility] is a 21 y.o. G2P1001 (Patient's last menstrual period was 12/05/2020.), with the above CC. PMHx is significant for late [facility], h/o UTI with no TOC.  [facility] is a 21 y.o. female G2P1001 @ [redacted]w[redacted]d here in MAU with complaints of N/V for 2 days. Reports not being able to keep anything down, including water. She has never had this problem, this is a new problem. She has not tried to eat anything today because she is afraid she will vomit. She reports having GERD in the past, and does avoid fried foods and fast foods. No bleeding or leaking of fluid. + fetal movement.     ROS: A 12-point review of systems was performed and negative, except as stated in the above HPI.  OBGYN History: As per HPI. OB History  Gravida Para Term Preterm AB Living  2 1 1     1   SAB IAB Ectopic Multiple Live Births        0 1    # Outcome Date GA Lbr Len/2nd Weight Sex Delivery Anes PTL Lv  2 Current           1 Term 08/13/20 [redacted]w[redacted]d 06:25 / 00:33 3345 g F Vag-Spont EPI  LIV     Past Medical History: Past Medical History:  Diagnosis Date   Right knee injury, initial encounter 08/10/2017   S/P tonsillectomy 07/16/2019   Tonsillitis    UTI (urinary tract infection)     Past Surgical History: Past Surgical History:  Procedure Laterality Date   TONSILLECTOMY Bilateral 07/16/2019   Procedure: TONSILLECTOMY;  Surgeon: 07/18/2019, MD;  Location: [facility];  Service: ENT;  Laterality: Bilateral;   UMBILICAL HERNIA REPAIR      Family History:  History reviewed. No pertinent family history.  Social History:  Social History   Socioeconomic History   Marital status: Single    Spouse name: Not on file   Number of  children: Not on file   Years of education: Not on file   Highest education level: Not on file  Occupational History   Not on file  Tobacco Use   Smoking status: Never   Smokeless tobacco: Never  Vaping Use   Vaping Use: Never used  Substance and Sexual Activity   Alcohol use: No   Drug use: Not Currently   Sexual activity: Not Currently  Other Topics Concern   Not on file  Social History Narrative   Not on file   Social Determinants of Health   Financial Resource Strain: Not on file  Food Insecurity: Not on file  Transportation Needs: Not on file  Physical Activity: Not on file  Stress: Not on file  Social Connections: Not on file  Intimate Partner Violence: Not on file   Allergy: Allergies  Allergen Reactions   Apple Juice Hives    Swollen lips   Fish Allergy Hives    tilapia     Current Outpatient Medications: PN vitamin  Hospital Medications: Current Facility-Administered Medications  Medication Dose Route Frequency Provider Last Rate Last Admin   0.9 %  sodium chloride infusion   Intravenous Continuous Serena Colonel, MD 100 mL/hr at 09/10/21 1829 New Bag at 09/10/21 1829   acetaminophen (TYLENOL) tablet 650 mg  650 mg Oral Q4H PRN [place] Bing, MD       calcium carbonate (TUMS - dosed in mg elemental calcium) chewable tablet 400 mg of elemental calcium  2 tablet Oral Q4H PRN [place] Bing, MD       cefTRIAXone (ROCEPHIN) 2 g in sodium chloride 0.9 % 100 mL IVPB  2 g Intravenous Q24H [place] Bing, MD 200 mL/hr at 09/10/21 1834 2 g at 09/10/21 1834   famotidine (PEPCID) IVPB 20 mg premix  20 mg Intravenous Q12H [place] Bing, MD 100 mL/hr at 09/10/21 1930 20 mg at 09/10/21 1930   ondansetron (ZOFRAN-ODT) disintegrating tablet 4 mg  4 mg Oral Q8H PRN [place] Bing, MD       oxyCODONE (Oxy IR/ROXICODONE) immediate release tablet 5-10 mg  5-10 mg Oral Q4H PRN [place] Bing, MD       promethazine (PHENERGAN) 25 mg in sodium chloride 0.9 % 50  mL IVPB  25 mg Intravenous Q6H PRN [place] Bing, MD       tamsulosin (FLOMAX) capsule 0.4 mg  0.4 mg Oral Daily [place] Bing, MD   0.4 mg at 09/10/21 1835     Physical Exam:   Current Vital Signs 24h Vital Sign Ranges  T 98.3 F (36.8 C) Temp  Avg: 98.3 F (36.8 C)  Min: 98.2 F (36.8 C)  Max: 98.4 F (36.9 C)  BP 132/75 BP  Min: 99/73  Max: 132/75  HR 95 Pulse  Avg: 107.8  Min: 95  Max: 121  RR 18 Resp  Avg: 19  Min: 18  Max: 20  SaO2 100 % Room Air SpO2  Avg: 99.3 %  Min: 98 %  Max: 100 %       24 Hour I/O Current Shift I/O  Time Ins Outs No intake/output data recorded. No intake/output data recorded.  Reactive NST in OB triage  Body mass index is 34.08 kg/m. General appearance: Well nourished, well developed female in no acute distress. Patient lying on right side Neck:  Supple, normal appearance, and no thyromegaly  Cardiovascular: S1, S2 normal, no murmur, rub or gallop, regular rate and rhythm Respiratory:  Clear to auscultation bilateral. Normal respiratory effort Abdomen: gravid, nttp Neuro/Psych:  Normal mood and affect.  Skin:  Warm and dry.  Extremities: no clubbing, cyanosis, or edema.     Laboratory:  Recent Labs  Lab 09/10/21 1404  WBC 7.8  HGB 9.2*  HCT 29.5*  PLT 259   Recent Labs  Lab 09/10/21 1404  NA 138  K 3.7  CL 108  CO2 20*  BUN <5*  CREATININE 0.57  CALCIUM 8.8*  PROT 6.2*  BILITOT 1.1  ALKPHOS 163*  ALT 7  AST 17  GLUCOSE 79   No results for input(s): APTT, INR, PTT in the last 168 hours.  Invalid input(s): DRHAPTT Recent Labs  Lab 09/10/21 1807  ABORH O POS    Imaging:  Narrative & Impression  CLINICAL DATA:  Nausea and vomiting; patient pregnant at [redacted] weeks 2 days EGA   EXAM: RENAL / URINARY TRACT ULTRASOUND COMPLETE   COMPARISON:  None   FINDINGS: Right Kidney:   Renal measurements: 12.0 x 7.1 x 4.8 cm = volume: 214 mL. Normal cortical thickness and echogenicity. Marked hydronephrosis. No  renal mass or definite shadowing calcification.   Left Kidney:   Renal measurements: 12.5 x 5.3 x 5.5 cm = volume: 190.5 mL. Normal cortical thickness and echogenicity. No mass, hydronephrosis or shadowing calcification.   Bladder:  Normal appearance for degree of distension. LEFT ureteral jet visualized. RIGHT ureteral jet was not seen during imaging.   Other:   N/A   IMPRESSION: Marked RIGHT hydronephrosis and absent RIGHT ureteral jet question ureteral obstruction; recommend correlation with urinalysis.   Unremarkable LEFT kidney and bladder.     Electronically Signed   By: Ulyses Southward M.D.   On: 09/10/2021 16:28   Narrative & Impression  CLINICAL DATA:  Nausea and vomiting, history of prior umbilical hernia repair. Patient pregnant at [redacted] weeks 2 days EGA   EXAM: ULTRASOUND ABDOMEN LIMITED RIGHT UPPER QUADRANT   COMPARISON:  None   FINDINGS: Gallbladder:   Gallbladder filled with multiple tiny shadowing calculi. Wall echo shadow complex. No pericholecystic fluid or sonographic Murphy sign.   Common bile duct:   Diameter: 3 mm, normal   Liver:   Normal echogenicity without mass or nodularity. Portal vein is patent on color Doppler imaging with normal direction of blood flow towards the liver.   Other: No RIGHT upper quadrant free fluid.   IMPRESSION: Gallbladder filled with multiple shadowing calculi creating a wall echo shadow complex.   No sonographic Murphy sign or pericholecystic fluid seen to suggest acute cholecystitis.   No biliary dilatation.     Electronically Signed   By: Ulyses Southward M.D.   On: 09/10/2021 16:26   Assessment: pt improving Continue rocephin. F/u s/s and labs in AM. Will continue with MIVF and do NPO after MN in case need for urology consult, possible CT scan   Total time taking care of the patient was 30 minutes, with greater than 50% of the time spent in face to face interaction with the patient.  Cornelia Copa MD Attending [facility] GYN Consult Phone: [phone] (M-F, 0800-1700) & [phone] (Off hours, weekends, holidays)

## 2021-09-10 NOTE — MAU Provider Note (Signed)
History     CSN: [id number]  Arrival date and time: 09/10/21 1323   Event Date/Time   First Provider Initiated Contact with Patient 09/10/21 1437      Chief Complaint  Patient presents with   Emesis   Nausea   HPI  Ms.Judy Ballard is a 21 y.o. female G2P1001 @ [redacted]w[redacted]d here in MAU with complaints of N/V for 2 days. Reports not being able to keep anything down, including water. She has never had this problem, this is a new problem. She has not tried to eat anything today because she is afraid she will vomit. She reports having GERD in the past, and does avoid fried foods and fast foods. No bleeding or leaking of fluid. + fetal movement.   OB History     Gravida  2   Para  1   Term  1   Preterm      AB      Living  1      SAB      IAB      Ectopic      Multiple  0   Live Births  1           Past Medical History:  Diagnosis Date   Tonsillitis    UTI (urinary tract infection)     Past Surgical History:  Procedure Laterality Date   TONSILLECTOMY Bilateral 07/16/2019   Procedure: TONSILLECTOMY;  Surgeon: Serena Colonel, MD;  Location: [facility];  Service: ENT;  Laterality: Bilateral;   UMBILICAL HERNIA REPAIR      History reviewed. No pertinent family history.  Social History   Tobacco Use   Smoking status: Never   Smokeless tobacco: Never  Vaping Use   Vaping Use: Never used  Substance Use Topics   Alcohol use: No   Drug use: Not Currently    Allergies:  Allergies  Allergen Reactions   Apple Juice Hives    Swollen lips   Fish Allergy Hives    tilapia     Medications Prior to Admission  Medication Sig Dispense Refill Last Dose   acetaminophen (TYLENOL) 325 MG tablet Take 2 tablets (650 mg total) by mouth every 4 (four) hours as needed (for pain scale < 4). (Patient not taking: No sig reported) 30 tablet 0    ibuprofen (ADVIL) 600 MG tablet Take 1 tablet (600 mg total) by mouth every 6 (six) hours. (Patient not  taking: No sig reported) 30 tablet 0    metroNIDAZOLE (FLAGYL) 500 MG tablet Take 1 tablet (500 mg total) by mouth 2 (two) times daily. 14 tablet 0    NIFEdipine (ADALAT CC) 30 MG 24 hr tablet Take 2 tablets (60 mg total) by mouth daily. (Patient not taking: No sig reported) 60 tablet 11    Prenatal Vit-Fe Fumarate-FA (PRENATAL MULTIVITAMIN) TABS tablet Take 1 tablet by mouth daily at 12 noon. (Patient not taking: No sig reported)      US RENAL  Result Date: 09/10/2021 CLINICAL DATA:  Nausea and vomiting; patient pregnant at [redacted] weeks 2 days EGA EXAM: RENAL / URINARY TRACT ULTRASOUND COMPLETE COMPARISON:  None FINDINGS: Right Kidney: Renal measurements: 12.0 x 7.1 x 4.8 cm = volume: 214 mL. Normal cortical thickness and echogenicity. Marked hydronephrosis. No renal mass or definite shadowing calcification. Left Kidney: Renal measurements: 12.5 x 5.3 x 5.5 cm = volume: 190.5 mL. Normal cortical thickness and echogenicity. No mass, hydronephrosis or shadowing calcification. Bladder: Normal appearance for degree of  distension. LEFT ureteral jet visualized. RIGHT ureteral jet was not seen during imaging. Other: N/A IMPRESSION: Marked RIGHT hydronephrosis and absent RIGHT ureteral jet question ureteral obstruction; recommend correlation with urinalysis. Unremarkable LEFT kidney and bladder. Electronically Signed   By: Ulyses Southward M.D.   On: 09/10/2021 16:28   US Abdomen Limited RUQ (LIVER/GB)  Result Date: 09/10/2021 CLINICAL DATA:  Nausea and vomiting, history of prior umbilical hernia repair. Patient pregnant at [redacted] weeks 2 days EGA EXAM: ULTRASOUND ABDOMEN LIMITED RIGHT UPPER QUADRANT COMPARISON:  None FINDINGS: Gallbladder: Gallbladder filled with multiple tiny shadowing calculi. Wall echo shadow complex. No pericholecystic fluid or sonographic Murphy sign. Common bile duct: Diameter: 3 mm, normal Liver: Normal echogenicity without mass or nodularity. Portal vein is patent on color Doppler imaging with  normal direction of blood flow towards the liver. Other: No RIGHT upper quadrant free fluid. IMPRESSION: Gallbladder filled with multiple shadowing calculi creating a wall echo shadow complex. No sonographic Murphy sign or pericholecystic fluid seen to suggest acute cholecystitis. No biliary dilatation. Electronically Signed   By: Ulyses Southward M.D.   On: 09/10/2021 16:26     Results for orders placed or performed during the hospital encounter of 09/10/21 (from the past 48 hour(s))  CBC with Differential/Platelet     Status: Abnormal   Collection Time: 09/10/21  2:04 PM  Result Value Ref Range   WBC 7.8 4.0 - 10.5 K/uL   RBC 3.75 (L) 3.87 - 5.11 MIL/uL   Hemoglobin 9.2 (L) 12.0 - 15.0 g/dL   HCT 23.9 (L) 53.2 - 02.3 %   MCV 78.7 (L) 80.0 - 100.0 fL   MCH 24.5 (L) 26.0 - 34.0 pg   MCHC 31.2 30.0 - 36.0 g/dL   RDW 34.3 (H) 56.8 - 61.6 %   Platelets 259 150 - 400 K/uL    Comment: REPEATED TO VERIFY   nRBC 0.0 0.0 - 0.2 %   Neutrophils Relative % 84 %   Neutro Abs 6.5 1.7 - 7.7 K/uL   Lymphocytes Relative 9 %   Lymphs Abs 0.7 0.7 - 4.0 K/uL   Monocytes Relative 6 %   Monocytes Absolute 0.5 0.1 - 1.0 K/uL   Eosinophils Relative 0 %   Eosinophils Absolute 0.0 0.0 - 0.5 K/uL   Basophils Relative 0 %   Basophils Absolute 0.0 0.0 - 0.1 K/uL   Immature Granulocytes 1 %   Abs Immature Granulocytes 0.07 0.00 - 0.07 K/uL    Comment: Performed at [facility], [address] [place], [place] [address]  Comprehensive metabolic panel     Status: Abnormal   Collection Time: 09/10/21  2:04 PM  Result Value Ref Range   Sodium 138 135 - 145 mmol/L   Potassium 3.7 3.5 - 5.1 mmol/L   Chloride 108 98 - 111 mmol/L   CO2 20 (L) 22 - 32 mmol/L   Glucose, Bld 79 70 - 99 mg/dL    Comment: Glucose reference range applies only to samples taken after fasting for at least 8 hours.   BUN <5 (L) 6 - 20 mg/dL   Creatinine, Ser 0.21 0.44 - 1.00 mg/dL   Calcium 8.8 (L) 8.9 - 10.3 mg/dL   Total Protein 6.2  (L) 6.5 - 8.1 g/dL   Albumin 2.5 (L) 3.5 - 5.0 g/dL   AST 17 15 - 41 U/L   ALT 7 0 - 44 U/L   Alkaline Phosphatase 163 (H) 38 - 126 U/L   Total Bilirubin 1.1  0.3 - 1.2 mg/dL   GFR, Estimated >89 >38 mL/min    Comment: (NOTE) Calculated using the CKD-EPI Creatinine Equation (2021)    Anion gap 10 5 - 15    Comment: Performed at [facility], [address], [place], [place] [address]  Urinalysis, Routine w reflex microscopic Urine, Clean Catch     Status: Abnormal   Collection Time: 09/10/21  2:25 PM  Result Value Ref Range   Color, Urine YELLOW YELLOW   APPearance CLOUDY (A) CLEAR   Specific Gravity, Urine 1.011 1.005 - 1.030   pH 6.0 5.0 - 8.0   Glucose, UA NEGATIVE NEGATIVE mg/dL   Hgb urine dipstick NEGATIVE NEGATIVE   Bilirubin Urine NEGATIVE NEGATIVE   Ketones, ur 80 (A) NEGATIVE mg/dL   Protein, ur NEGATIVE NEGATIVE mg/dL   Nitrite POSITIVE (A) NEGATIVE   Leukocytes,Ua LARGE (A) NEGATIVE   RBC / HPF 6-10 0 - 5 RBC/hpf   WBC, UA >50 (H) 0 - 5 WBC/hpf   Bacteria, UA MANY (A) NONE SEEN   Squamous Epithelial / LPF 0-5 0 - 5   WBC Clumps PRESENT    Mucus PRESENT     Comment: Performed at [facility], [address] [place], [place] [address]  Lipase, blood     Status: None   Collection Time: 09/10/21  5:30 PM  Result Value Ref Range   Lipase 19 11 - 51 U/L    Comment: Performed at [facility], [address] [place], [place] [address]     Review of Systems  Constitutional:  Negative for fever.  Gastrointestinal:  Positive for nausea and vomiting. Negative for abdominal pain.  Genitourinary:  Negative for dysuria and flank pain.  Physical Exam   Blood pressure 112/67, pulse (!) 105, temperature 98.4 F (36.9 C), temperature source Oral, resp. rate 20, last menstrual period 12/05/2020, SpO2 99 %, unknown if currently breastfeeding.  Physical Exam Constitutional:      General: She is not in acute distress.    Appearance: Normal appearance. She  is not ill-appearing, toxic-appearing or diaphoretic.  HENT:     Head: Normocephalic.  Eyes:     Pupils: Pupils are equal, round, and reactive to light.  Abdominal:     Palpations: Abdomen is soft.     Tenderness: There is abdominal tenderness. There is right CVA tenderness.  Skin:    General: Skin is warm.  Neurological:     Mental Status: She is alert and oriented to person, place, and time.  Psychiatric:        Behavior: Behavior normal.   Fetal Tracing  Baseline: 125 bpm Variability: Moderate  Accelerations: 15x15 Decelerations: None Toco:Q2-3 mins  MAU Course  Procedures None  MDM  EKG reviewed- shows normal sinus tachycardia.  LR bolus x2  CBC, CMP, lipase ordered Zofran 8 mg & Pepcid IV given Reviewed patient with Dr. Crissie Reese & on call urology Dr. Mena Goes who recommends observation over night given her N/V status. If no improvement over night and continued vomiting he would recommend a call back to urology and a CT scan.   Assessment and Plan   A:  1. Hydronephrosis, unspecified hydronephrosis type   2. GERD (gastroesophageal reflux disease)   3. Nausea and vomiting   4. [redacted] weeks gestation of pregnancy      P:   Admit to [facility] Start flomax Strain all urine  Secily Walthour, Harolyn Rutherford, NP 09/10/2021 6:32 PM

## 2021-09-10 NOTE — MAU Note (Signed)
Presents with c/o N/V since last night.  Reports unable to keep anything down, including water. Denies LOF or VB.  Endorses +FM.

## 2021-09-10 NOTE — Plan of Care (Signed)
Problem: Education: °Goal: Knowledge of General Education information will improve °Description: Including pain rating scale, medication(s)/side effects and non-pharmacologic comfort measures °Outcome: Completed/Met °  °

## 2021-09-11 LAB — COMPREHENSIVE METABOLIC PANEL
ALT: 6 U/L (ref 0–44)
AST: 13 U/L — ABNORMAL LOW (ref 15–41)
Albumin: 2 g/dL — ABNORMAL LOW (ref 3.5–5.0)
Alkaline Phosphatase: 130 U/L — ABNORMAL HIGH (ref 38–126)
Anion gap: 8 (ref 5–15)
BUN: <5 mg/dL — ABNORMAL LOW (ref 6–20)
CO2: 22 mmol/L (ref 22–32)
Calcium: 8.2 mg/dL — ABNORMAL LOW (ref 8.9–10.3)
Chloride: 110 mmol/L (ref 98–111)
Creatinine, Ser: 0.6 mg/dL (ref 0.44–1.00)
GFR, Estimated: >60 mL/min (ref >60)
Glucose, Bld: 82 mg/dL (ref 70–99)
Potassium: 3.6 mmol/L (ref 3.5–5.1)
Sodium: 140 mmol/L (ref 135–145)
Total Bilirubin: 0.9 mg/dL (ref 0.3–1.2)
Total Protein: 4.9 g/dL — ABNORMAL LOW (ref 6.5–8.1)

## 2021-09-11 LAB — CBC WITH DIFFERENTIAL/PLATELET
Abs Immature Granulocytes: 0.05 10*3/uL (ref 0.00–0.07)
Basophils Absolute: 0 10*3/uL (ref 0.0–0.1)
Basophils Relative: 0 %
Eosinophils Absolute: 0.1 10*3/uL (ref 0.0–0.5)
Eosinophils Relative: 1 %
HCT: 23.7 % — ABNORMAL LOW (ref 36.0–46.0)
Hemoglobin: 7.5 g/dL — ABNORMAL LOW (ref 12.0–15.0)
Immature Granulocytes: 1 %
Lymphocytes Relative: 13 %
Lymphs Abs: 0.8 10*3/uL (ref 0.7–4.0)
MCH: 24.8 pg — ABNORMAL LOW (ref 26.0–34.0)
MCHC: 31.6 g/dL (ref 30.0–36.0)
MCV: 78.2 fL — ABNORMAL LOW (ref 80.0–100.0)
Monocytes Absolute: 0.5 10*3/uL (ref 0.1–1.0)
Monocytes Relative: 9 %
Neutro Abs: 4.3 10*3/uL (ref 1.7–7.7)
Neutrophils Relative %: 76 %
Platelets: 227 10*3/uL (ref 150–400)
RBC: 3.03 MIL/uL — ABNORMAL LOW (ref 3.87–5.11)
RDW: 25.4 % — ABNORMAL HIGH (ref 11.5–15.5)
WBC: 5.6 10*3/uL (ref 4.0–10.5)
nRBC: 0 % (ref 0.0–0.2)

## 2021-09-11 MED ORDER — SODIUM CHLORIDE 0.9 % IV SOLN: INTRAVENOUS | Status: DC

## 2021-09-11 MED ORDER — SODIUM CHLORIDE 0.9 % IV SOLN
500.0000 mg | Freq: Once | INTRAVENOUS | Status: AC
  Administered 2021-09-11: 500 mg via INTRAVENOUS
  Filled 2021-09-11: qty 25

## 2021-09-11 MED ORDER — FAMOTIDINE 20 MG PO TABS
20.0000 mg | ORAL_TABLET | Freq: Two times a day (BID) | ORAL | Status: DC
  Administered 2021-09-11 – 2021-09-12 (×2): 20 mg via ORAL
  Filled 2021-09-11 (×2): qty 1

## 2021-09-11 NOTE — Progress Notes (Addendum)
Daily Antepartum Note  Admission Date: 09/10/2021 Current Date: 09/11/2021 7:33 AM  Judy Ballard is a 21 y.o. G2P1001 @ [redacted]w[redacted]d, HD#2, admitted for right stone and/or right pyelo, n/v, gallstones seen.  Pregnancy complicated by: Patient Active Problem List   Diagnosis Date Noted   Pyelonephritis affecting pregnancy 09/10/2021   Gallstones 09/10/2021   Anemia affecting pregnancy in third trimester 09/10/2021   UTI in pregnancy, antepartum, first trimester 09/10/2021   Short interval between pregnancies affecting pregnancy in third trimester, antepartum 09/10/2021   History of gestational hypertension 08/13/2020    Overnight/24hr events:  none  Subjective:  Pt feeling better. No s/s of n/v and no OB s/s. No back pain  Objective:    Current Vital Signs 24h Vital Sign Ranges  T 98 F (36.7 C) Temp  Avg: 98.3 F (36.8 C)  Min: 98 F (36.7 C)  Max: 98.4 F (36.9 C)  BP 111/63 BP  Min: 99/73  Max: 132/75  HR (!) 102  Pulse  Avg: 106  Min: 95  Max: 121  RR 16 Resp  Avg: 17.6  Min: 16  Max: 20  SaO2 100 % Room Air SpO2  Avg: 99.4 %  Min: 98 %  Max: 100 %       24 Hour I/O Current Shift I/O  Time Ins Outs 02/16 0701 - 02/17 0700 In: -  Out: 800 [Urine:800] No intake/output data recorded.   Fetal Heart Tones: pending Tocometry: pending  Physical exam: General: Well nourished, well developed female in no acute distress. Abdomen: gravid nttp Back: no CVAT Cardiovascular: S1, S2 normal, no murmur, rub or gallop, regular rate and rhythm Respiratory: CTAB Extremities: no clubbing, cyanosis or edema Skin: Warm and dry.   Medications: Current Facility-Administered Medications  Medication Dose Route Frequency Provider Last Rate Last Admin   0.9 %  sodium chloride infusion   Intravenous Continuous Aletha Halim, MD 100 mL/hr at 09/11/21 0538 New Bag at 09/11/21 0538   acetaminophen (TYLENOL) tablet 650 mg  650 mg Oral Q4H PRN Aletha Halim, MD       calcium carbonate  (TUMS - dosed in mg elemental calcium) chewable tablet 400 mg of elemental calcium  2 tablet Oral Q4H PRN Aletha Halim, MD       cefTRIAXone (ROCEPHIN) 2 g in sodium chloride 0.9 % 100 mL IVPB  2 g Intravenous Q24H Aletha Halim, MD 200 mL/hr at 09/10/21 1834 2 g at 09/10/21 1834   famotidine (PEPCID) IVPB 20 mg premix  20 mg Intravenous Q12H Aletha Halim, MD 100 mL/hr at 09/11/21 0539 20 mg at 09/11/21 0539   ondansetron (ZOFRAN-ODT) disintegrating tablet 4 mg  4 mg Oral Q8H PRN Aletha Halim, MD       oxyCODONE (Oxy IR/ROXICODONE) immediate release tablet 5-10 mg  5-10 mg Oral Q4H PRN Aletha Halim, MD       promethazine (PHENERGAN) 25 mg in sodium chloride 0.9 % 50 mL IVPB  25 mg Intravenous Q6H PRN Aletha Halim, MD       tamsulosin (FLOMAX) capsule 0.4 mg  0.4 mg Oral Daily Aletha Halim, MD   0.4 mg at 09/10/21 1835    Labs:  Urine culture pending  Recent Labs  Lab 09/10/21 1404 09/11/21 0458  WBC 7.8 5.6  HGB 9.2* 7.5*  HCT 29.5* 23.7*  PLT 259 227    Recent Labs  Lab 09/10/21 1404 09/11/21 0458  NA 138 140  K 3.7 3.6  CL 108 110  CO2 20* 22  BUN <5* <5*  CREATININE 0.57 0.60  CALCIUM 8.8* 8.2*  PROT 6.2* 4.9*  BILITOT 1.1 0.9  ALKPHOS 163* 130*  ALT 7 6  AST 17 13*  GLUCOSE 79 82    Radiology:  No new imaging  Assessment & Plan:  Pt improving *Pregnancy: follow up NST for today *Right pyelo: likely pyelo due to resolved s/s. Continue with IVF hydration and abx. Follow up ucx. Continue flomax for now.  *Gallstones: no e/o infection. D/w her re: need for low fat diet *Anemia: s/p feraheme x 1 in early feb. Pt amenable to second IV iron dose ordered for today *Preterm: no issues *PPx: scd *FEN/GI: low fat diet *Dispo: maybe tomorrow.   Durene Romans MD [facility]ending [facility] GYN Consult Phone: [phone] (M-F, 0800-1700) & [phone]  (Off hours, weekends, holidays)

## 2021-09-12 DIAGNOSIS — O2303 Infections of kidney in pregnancy, third trimester: Secondary | ICD-10-CM | POA: Diagnosis not present

## 2021-09-12 DIAGNOSIS — Z3A36 36 weeks gestation of pregnancy: Secondary | ICD-10-CM | POA: Diagnosis not present

## 2021-09-12 DIAGNOSIS — R112 Nausea with vomiting, unspecified: Secondary | ICD-10-CM | POA: Diagnosis not present

## 2021-09-12 LAB — CULTURE, OB URINE: Culture: >=100000 — AB

## 2021-09-12 MED ORDER — TAMSULOSIN HCL 0.4 MG PO CAPS: 0.4000 mg | ORAL_CAPSULE | Freq: Every day | ORAL | 0 refills | Status: DC

## 2021-09-12 MED ORDER — CEFADROXIL 500 MG PO CAPS: 500.0000 mg | ORAL_CAPSULE | Freq: Two times a day (BID) | ORAL | 0 refills | Status: DC

## 2021-09-12 NOTE — Progress Notes (Signed)
Patient ID: JANNAH GUARDIOLA, female   DOB: 11/20/2000, 21 y.o.   MRN: [id number] [facility] ANTEPARTUM COMPREHENSIVE PROGRESS NOTE  ADDALYNN KUMARI is a 21 y.o. G2P1001 at [redacted]w[redacted]d  who is admitted for pyelonephritis.   Fetal presentation is cephalic. Length of Stay:  2  Days  Subjective: Pt has no complaints this morning. Denies HA or visual changes. Patient reports good fetal movement.  She reports no uterine contractions, no bleeding and no loss of fluid per vagina.  Vitals:  Blood pressure 121/70, pulse 93, temperature 98.2 F (36.8 C), temperature source Oral, resp. rate 18, height 5\' 3"  (1.6 m), weight 87.3 kg, last menstrual period 12/05/2020, SpO2 99 %, unknown if currently breastfeeding. Physical Examination: Lungs clear Heart RRR Abd soft + BS gravid non tender Back no CVA tenderness GU deferred Ext non tender  Fetal Monitoring:  Baseline: 130-140's bpm. + accels, reactive, no ut ctx  Labs:  No results found for this or any previous visit (from the past 24 hour(s)).  Imaging Studies:    NA   Medications:  Scheduled  famotidine  20 mg Oral BID   tamsulosin  0.4 mg Oral Daily   I have reviewed the patient's current medications.  ASSESSMENT: IUP 36 3/7 weeks Pyelonephritis Gallstones Anemia   PLAN: Stable. Continue with Rocephin. UC E. Coli sensitives pending. S/P Ferehem. Fetal well being reassuring. Switch to oral agent once sensitives return. Hopefully discharge tomorrow.   Continue routine antenatal care.   5/7 09/12/2021,8:55 AM

## 2021-09-12 NOTE — Discharge Summary (Signed)
Patient ID: Judy Ballard MRN: [id number] DOB/AGE: 10/24/2000 20 y.o.  Admit date: 09/10/2021 Discharge date: 09/12/2021  Admission Diagnoses:  Discharge Diagnoses: Hydronephrosis, unspecified hydronephrosis type  GERD (gastroesophageal reflux disease) - Plan: US RENAL, US RENAL, US Abdomen Limited RUQ (LIVER/GB), US Abdomen Limited RUQ (LIVER/GB)  Nausea and vomiting - Plan: US RENAL, US RENAL, US Abdomen Limited RUQ (LIVER/GB), US Abdomen Limited RUQ (LIVER/GB)  [redacted] weeks gestation of pregnancy - Plan: Discharge patient  Pyelonephritis affecting pregnancy in third trimester - Plan: Discharge patient   Prenatal Procedures: ultrasound    Hospital Course:  This is a 21 y.o. G2P1001 with IUP at [redacted]w[redacted]d admitted for Reason for Admission: right pyelo and/or stone, n/v, gallstones   History of Present Illness: Judy Ballard is a 21 y.o. G2P1001 (Patient's last menstrual period was 12/05/2020.), with the above CC. PMHx is significant for late [facility], h/o UTI with no TOC.   Judy Ballard is a 21 y.o. female G2P1001 @ [redacted]w[redacted]d here in MAU with complaints of N/V for 2 days. Reports not being able to keep anything down, including water. She has never had this problem, this is a new problem. She has not tried to eat anything today because she is afraid she will vomit. She reports having GERD in the past, and does avoid fried foods and fast foods. No bleeding or leaking of fluid. + fetal movement.   She was admitted for management of pyelonephritis with hydronephrosis and renal stone. She received IV Rocephin and Flomax and remained afebrile and her other symptoms improved. She was discharged on Duricef for 10 days and was to f/u with her Ob provider.   She was deemed stable for discharge to home with outpatient follow up.  Discharge Exam: Temp:  [97.6 F (36.4 C)-98.2 F (36.8 C)] 98.2 F (36.8 C) (02/18 0812) Pulse Rate:  [93-97] 93 (02/18 0812) Resp:  [16-18] 18 (02/18 0812) BP:  (111-125)/(57-87) 121/70 (02/18 0812) SpO2:  [99 %-100 %] 99 % (02/18 3762) Physical Examination: CONSTITUTIONAL: Well-developed, well-nourished female in no acute distress.  HENT:  Normocephalic, atraumatic, External right and left ear normal. Oropharynx is clear and moist EYES: Conjunctivae and EOM are normal. Pupils are equal, round, and reactive to light. No scleral icterus.  NECK: Normal range of motion, supple, no masses SKIN: Skin is warm and dry. No rash noted. Not diaphoretic. No erythema. No pallor. NEUROLGIC: Alert and oriented to person, place, and time. Normal reflexes, muscle tone coordination. No cranial nerve deficit noted. PSYCHIATRIC: Normal mood and affect. Normal behavior. Normal judgment and thought content. CARDIOVASCULAR: Normal heart rate noted, regular rhythm RESPIRATORY: Effort and breath sounds normal, no problems with respiration noted MUSCULOSKELETAL: Normal range of motion. No edema and no tenderness. 2+ distal pulses. ABDOMEN: Soft, nontender, nondistended, gravid. CERVIX: Dilation: 1 Effacement (%): 50 Cervical Position: Posterior Station: -2 Presentation: Vertex Exam by:: weston,rn Fetal Heart Rate A   Mode External filed at 09/12/2021 1115  Baseline Rate (A) 125 bpm filed at 09/12/2021 1115  Variability 6-25 BPM filed at 09/12/2021 1115  Accelerations 15 x 15 filed at 09/12/2021 1115  Decelerations Variable filed at 09/12/2021 1115  Multiple birth? N filed at 09/10/2021 1520    Significant Diagnostic Studies:  Results for orders placed or performed during the hospital encounter of 09/10/21 (from the past 168 hour(s))  CBC with Differential/Platelet   Collection Time: 09/10/21  2:04 PM  Result Value Ref Range   WBC 7.8 4.0 - 10.5 K/uL   RBC 3.75 (  L) 3.87 - 5.11 MIL/uL   Hemoglobin 9.2 (L) 12.0 - 15.0 g/dL   HCT 28.7 (L) 68.1 - 15.7 %   MCV 78.7 (L) 80.0 - 100.0 fL   MCH 24.5 (L) 26.0 - 34.0 pg   MCHC 31.2 30.0 - 36.0 g/dL   RDW 26.2 (H) 03.5 -  15.5 %   Platelets 259 150 - 400 K/uL   nRBC 0.0 0.0 - 0.2 %   Neutrophils Relative % 84 %   Neutro Abs 6.5 1.7 - 7.7 K/uL   Lymphocytes Relative 9 %   Lymphs Abs 0.7 0.7 - 4.0 K/uL   Monocytes Relative 6 %   Monocytes Absolute 0.5 0.1 - 1.0 K/uL   Eosinophils Relative 0 %   Eosinophils Absolute 0.0 0.0 - 0.5 K/uL   Basophils Relative 0 %   Basophils Absolute 0.0 0.0 - 0.1 K/uL   Immature Granulocytes 1 %   Abs Immature Granulocytes 0.07 0.00 - 0.07 K/uL  Comprehensive metabolic panel   Collection Time: 09/10/21  2:04 PM  Result Value Ref Range   Sodium 138 135 - 145 mmol/L   Potassium 3.7 3.5 - 5.1 mmol/L   Chloride 108 98 - 111 mmol/L   CO2 20 (L) 22 - 32 mmol/L   Glucose, Bld 79 70 - 99 mg/dL   BUN <5 (L) 6 - 20 mg/dL   Creatinine, Ser 5.97 0.44 - 1.00 mg/dL   Calcium 8.8 (L) 8.9 - 10.3 mg/dL   Total Protein 6.2 (L) 6.5 - 8.1 g/dL   Albumin 2.5 (L) 3.5 - 5.0 g/dL   AST 17 15 - 41 U/L   ALT 7 0 - 44 U/L   Alkaline Phosphatase 163 (H) 38 - 126 U/L   Total Bilirubin 1.1 0.3 - 1.2 mg/dL   GFR, Estimated >41 >63 mL/min   Anion gap 10 5 - 15  OB Urine Culture   Collection Time: 09/10/21  2:25 PM   Specimen: OB Clean Catch; Urine  Result Value Ref Range   Specimen Description OB CLEAN CATCH    Special Requests NONE    Culture (A)     >=100,000 COLONIES/mL ESCHERICHIA COLI NO GROUP B STREP (S.AGALACTIAE) ISOLATED Performed at [facility], [address] [place], [place] [address]    Report Status 09/12/2021 FINAL    Organism ID, Bacteria ESCHERICHIA COLI (A)       Susceptibility   Escherichia coli - MIC*    AMPICILLIN <=2 SENSITIVE Sensitive     CEFAZOLIN <=4 SENSITIVE Sensitive     CEFEPIME <=0.12 SENSITIVE Sensitive     CEFTAZIDIME <=1 SENSITIVE Sensitive     CEFTRIAXONE <=0.25 SENSITIVE Sensitive     CIPROFLOXACIN <=0.25 SENSITIVE Sensitive     GENTAMICIN <=1 SENSITIVE Sensitive     IMIPENEM <=0.25 SENSITIVE Sensitive     TRIMETH/SULFA <=20 SENSITIVE  Sensitive     AMPICILLIN/SULBACTAM <=2 SENSITIVE Sensitive     PIP/TAZO <=4 SENSITIVE Sensitive     * >=100,000 COLONIES/mL ESCHERICHIA COLI  Urinalysis, Routine w reflex microscopic Urine, Clean Catch   Collection Time: 09/10/21  2:25 PM  Result Value Ref Range   Color, Urine YELLOW YELLOW   APPearance CLOUDY (A) CLEAR   Specific Gravity, Urine 1.011 1.005 - 1.030   pH 6.0 5.0 - 8.0   Glucose, UA NEGATIVE NEGATIVE mg/dL   Hgb urine dipstick NEGATIVE NEGATIVE   Bilirubin Urine NEGATIVE NEGATIVE   Ketones, ur 80 (A) NEGATIVE mg/dL   Protein, ur NEGATIVE  NEGATIVE mg/dL   Nitrite POSITIVE (A) NEGATIVE   Leukocytes,Ua LARGE (A) NEGATIVE   RBC / HPF 6-10 0 - 5 RBC/hpf   WBC, UA >50 (H) 0 - 5 WBC/hpf   Bacteria, UA MANY (A) NONE SEEN   Squamous Epithelial / LPF 0-5 0 - 5   WBC Clumps PRESENT    Mucus PRESENT   Lipase, blood   Collection Time: 09/10/21  5:30 PM  Result Value Ref Range   Lipase 19 11 - 51 U/L  Resp Panel by RT-PCR (Flu A&B, Covid) Nasopharyngeal Swab   Collection Time: 09/10/21  6:07 PM   Specimen: Nasopharyngeal Swab; Nasopharyngeal(NP) swabs in vial transport medium  Result Value Ref Range   SARS Coronavirus 2 by RT PCR NEGATIVE NEGATIVE   Influenza A by PCR NEGATIVE NEGATIVE   Influenza B by PCR NEGATIVE NEGATIVE  Type and screen [facility]   Collection Time: 09/10/21  6:07 PM  Result Value Ref Range   ABO/RH(D) O POS    Antibody Screen NEG    Sample Expiration      09/13/2021,2359 Performed at [facility], [address], [place], [place] [address]   CBC with Differential   Collection Time: 09/11/21  4:58 AM  Result Value Ref Range   WBC 5.6 4.0 - 10.5 K/uL   RBC 3.03 (L) 3.87 - 5.11 MIL/uL   Hemoglobin 7.5 (L) 12.0 - 15.0 g/dL   HCT 91.4 (L) 78.2 - 95.6 %   MCV 78.2 (L) 80.0 - 100.0 fL   MCH 24.8 (L) 26.0 - 34.0 pg   MCHC 31.6 30.0 - 36.0 g/dL   RDW 21.3 (H) 08.6 - 57.8 %   Platelets 227 150 - 400 K/uL   nRBC 0.0 0.0 - 0.2  %   Neutrophils Relative % 76 %   Neutro Abs 4.3 1.7 - 7.7 K/uL   Lymphocytes Relative 13 %   Lymphs Abs 0.8 0.7 - 4.0 K/uL   Monocytes Relative 9 %   Monocytes Absolute 0.5 0.1 - 1.0 K/uL   Eosinophils Relative 1 %   Eosinophils Absolute 0.1 0.0 - 0.5 K/uL   Basophils Relative 0 %   Basophils Absolute 0.0 0.0 - 0.1 K/uL   Immature Granulocytes 1 %   Abs Immature Granulocytes 0.05 0.00 - 0.07 K/uL  Comprehensive metabolic panel   Collection Time: 09/11/21  4:58 AM  Result Value Ref Range   Sodium 140 135 - 145 mmol/L   Potassium 3.6 3.5 - 5.1 mmol/L   Chloride 110 98 - 111 mmol/L   CO2 22 22 - 32 mmol/L   Glucose, Bld 82 70 - 99 mg/dL   BUN <5 (L) 6 - 20 mg/dL   Creatinine, Ser 4.69 0.44 - 1.00 mg/dL   Calcium 8.2 (L) 8.9 - 10.3 mg/dL   Total Protein 4.9 (L) 6.5 - 8.1 g/dL   Albumin 2.0 (L) 3.5 - 5.0 g/dL   AST 13 (L) 15 - 41 U/L   ALT 6 0 - 44 U/L   Alkaline Phosphatase 130 (H) 38 - 126 U/L   Total Bilirubin 0.9 0.3 - 1.2 mg/dL   GFR, Estimated >62 >95 mL/min   Anion gap 8 5 - 15    Discharge Condition: Stable  Disposition: Discharge disposition: 01-Home or Self Care        Discharge Instructions     Discharge patient   Complete by: As directed    Discharge disposition: 01-Home or Self Care  Discharge patient date: 09/12/2021      Allergies as of 09/12/2021       Reactions   Apple Juice Hives   Swollen lips   Fish Allergy Hives   tilapia        Medication List     STOP taking these medications    acetaminophen 325 MG tablet Commonly known as: Tylenol   ibuprofen 600 MG tablet Commonly known as: ADVIL   metroNIDAZOLE 500 MG tablet Commonly known as: FLAGYL   NIFEdipine 30 MG 24 hr tablet Commonly known as: ADALAT CC   prenatal multivitamin Tabs tablet       TAKE these medications    cefadroxil 500 MG capsule Commonly known as: DURICEF Take 1 capsule (500 mg total) by mouth 2 (two) times daily.   tamsulosin 0.4 MG Caps  capsule Commonly known as: FLOMAX Take 1 capsule (0.4 mg total) by mouth daily. Start taking on: September 13, 2021        Follow-up Information     Nino ParsleyKozel, Julie S, NP Follow up in 3 day(s).   Specialty: Obstetrics and Gynecology Contact information: [address] [place] [phone]                 Signed: Scheryl DarterJames Taneil Lazarus M.D. 09/12/2021, 12:03 PM

## 2021-09-17 DIAGNOSIS — O0933 Supervision of pregnancy with insufficient antenatal care, third trimester: Secondary | ICD-10-CM | POA: Diagnosis not present

## 2021-09-17 DIAGNOSIS — Z113 Encounter for screening for infections with a predominantly sexual mode of transmission: Secondary | ICD-10-CM | POA: Diagnosis not present

## 2021-09-17 DIAGNOSIS — Z369 Encounter for antenatal screening, unspecified: Secondary | ICD-10-CM | POA: Diagnosis not present

## 2021-09-17 DIAGNOSIS — O99013 Anemia complicating pregnancy, third trimester: Secondary | ICD-10-CM | POA: Diagnosis not present

## 2021-09-17 DIAGNOSIS — Z3483 Encounter for supervision of other normal pregnancy, third trimester: Secondary | ICD-10-CM | POA: Diagnosis not present

## 2021-09-17 LAB — OB RESULTS CONSOLE GBS: GBS: NEGATIVE

## 2021-09-17 LAB — OB RESULTS CONSOLE GC/CHLAMYDIA: Gonorrhea: NEGATIVE

## 2021-09-24 DIAGNOSIS — Z3483 Encounter for supervision of other normal pregnancy, third trimester: Secondary | ICD-10-CM | POA: Diagnosis not present

## 2021-09-24 DIAGNOSIS — O2303 Infections of kidney in pregnancy, third trimester: Secondary | ICD-10-CM | POA: Diagnosis not present

## 2021-09-30 DIAGNOSIS — O26893 Other specified pregnancy related conditions, third trimester: Secondary | ICD-10-CM | POA: Diagnosis not present

## 2021-09-30 DIAGNOSIS — Z3A39 39 weeks gestation of pregnancy: Secondary | ICD-10-CM | POA: Diagnosis not present

## 2021-09-30 DIAGNOSIS — G44209 Tension-type headache, unspecified, not intractable: Secondary | ICD-10-CM | POA: Diagnosis not present

## 2021-09-30 DIAGNOSIS — O163 Unspecified maternal hypertension, third trimester: Secondary | ICD-10-CM | POA: Diagnosis not present

## 2021-10-01 ENCOUNTER — Other Ambulatory Visit: Payer: Self-pay

## 2021-10-01 ENCOUNTER — Inpatient Hospital Stay (HOSPITAL_COMMUNITY): Payer: Medicaid Other

## 2021-10-01 ENCOUNTER — Inpatient Hospital Stay (HOSPITAL_COMMUNITY)
Admission: AD | Admit: 2021-10-01 | Discharge: 2021-10-01 | Disposition: A | Discharge status: Home / Self Care | Payer: Medicaid Other | Attending: Obstetrics and Gynecology | Admitting: Obstetrics and Gynecology

## 2021-10-01 ENCOUNTER — Encounter (HOSPITAL_COMMUNITY): Payer: Self-pay | Admitting: Obstetrics and Gynecology

## 2021-10-01 DIAGNOSIS — R1011 Right upper quadrant pain: Secondary | ICD-10-CM | POA: Diagnosis not present

## 2021-10-01 DIAGNOSIS — O163 Unspecified maternal hypertension, third trimester: Secondary | ICD-10-CM | POA: Diagnosis not present

## 2021-10-01 DIAGNOSIS — G44209 Tension-type headache, unspecified, not intractable: Secondary | ICD-10-CM | POA: Diagnosis not present

## 2021-10-01 DIAGNOSIS — Z3689 Encounter for other specified antenatal screening: Secondary | ICD-10-CM

## 2021-10-01 DIAGNOSIS — O26893 Other specified pregnancy related conditions, third trimester: Secondary | ICD-10-CM | POA: Diagnosis not present

## 2021-10-01 DIAGNOSIS — K819 Cholecystitis, unspecified: Secondary | ICD-10-CM | POA: Insufficient documentation

## 2021-10-01 DIAGNOSIS — Z3A39 39 weeks gestation of pregnancy: Secondary | ICD-10-CM | POA: Insufficient documentation

## 2021-10-01 DIAGNOSIS — O99613 Diseases of the digestive system complicating pregnancy, third trimester: Secondary | ICD-10-CM | POA: Insufficient documentation

## 2021-10-01 DIAGNOSIS — N1330 Unspecified hydronephrosis: Secondary | ICD-10-CM

## 2021-10-01 LAB — COMPREHENSIVE METABOLIC PANEL
ALT: 8 U/L (ref 0–44)
AST: 18 U/L (ref 15–41)
Albumin: 2.7 g/dL — ABNORMAL LOW (ref 3.5–5.0)
Alkaline Phosphatase: 165 U/L — ABNORMAL HIGH (ref 38–126)
Anion gap: 10 (ref 5–15)
BUN: <5 mg/dL — ABNORMAL LOW (ref 6–20)
CO2: 20 mmol/L — ABNORMAL LOW (ref 22–32)
Calcium: 9 mg/dL (ref 8.9–10.3)
Chloride: 108 mmol/L (ref 98–111)
Creatinine, Ser: 0.57 mg/dL (ref 0.44–1.00)
GFR, Estimated: >60 mL/min (ref >60)
Glucose, Bld: 97 mg/dL (ref 70–99)
Potassium: 3.3 mmol/L — ABNORMAL LOW (ref 3.5–5.1)
Sodium: 138 mmol/L (ref 135–145)
Total Bilirubin: 0.6 mg/dL (ref 0.3–1.2)
Total Protein: 6.1 g/dL — ABNORMAL LOW (ref 6.5–8.1)

## 2021-10-01 LAB — PROTEIN / CREATININE RATIO, URINE
Creatinine, Urine: 106.56 mg/dL
Protein Creatinine Ratio: 0.16 mg/mg{Cre} — ABNORMAL HIGH (ref 0.00–0.15)
Total Protein, Urine: 17 mg/dL

## 2021-10-01 LAB — CBC
HCT: 31.9 % — ABNORMAL LOW (ref 36.0–46.0)
Hemoglobin: 10.1 g/dL — ABNORMAL LOW (ref 12.0–15.0)
MCH: 26.2 pg (ref 26.0–34.0)
MCHC: 31.7 g/dL (ref 30.0–36.0)
MCV: 82.6 fL (ref 80.0–100.0)
Platelets: 217 10*3/uL (ref 150–400)
RBC: 3.86 MIL/uL — ABNORMAL LOW (ref 3.87–5.11)
RDW: 27.5 % — ABNORMAL HIGH (ref 11.5–15.5)
WBC: 7.2 10*3/uL (ref 4.0–10.5)
nRBC: 0 % (ref 0.0–0.2)

## 2021-10-01 LAB — URINALYSIS, ROUTINE W REFLEX MICROSCOPIC
Bilirubin Urine: NEGATIVE
Glucose, UA: NEGATIVE mg/dL
Hgb urine dipstick: NEGATIVE
Ketones, ur: NEGATIVE mg/dL
Leukocytes,Ua: NEGATIVE
Nitrite: NEGATIVE
Protein, ur: NEGATIVE mg/dL
Specific Gravity, Urine: 1.011 (ref 1.005–1.030)
pH: 6 (ref 5.0–8.0)

## 2021-10-01 MED ORDER — HYDROCODONE-ACETAMINOPHEN 5-325 MG PO TABS
2.0000 | ORAL_TABLET | Freq: Once | ORAL | Status: AC
  Administered 2021-10-01: 21:00:00 2 via ORAL
  Filled 2021-10-01: qty 2

## 2021-10-01 NOTE — MAU Note (Signed)
.  Judy Ballard is a 21 y.o. at [redacted]w[redacted]d here in MAU reporting: sharp pain on right side under right breast that started around 1500. States it hurts to walk, talk, laugh, "anything". Denies any HA, visual disturbances, VB, or LOF. States she has been dizzy for the past 2 days. +FM.  ? ?Onset of complaint: 1500 ?Pain score: 10 ?Vitals:  ? 10/01/21 2020  ?BP: 129/79  ?Pulse: (!) 113  ?Resp: (!) 22  ?Temp: 98.1 ?F (36.7 ?C)  ?SpO2: 100%  ?   ?FHT:153 ? ?Lab orders placed from triage: UA  ? ?

## 2021-10-01 NOTE — MAU Provider Note (Signed)
History     CSN: [id number]  Arrival date and time: 10/01/21 1956   Event Date/Time   First Provider Initiated Contact with Patient 10/01/21 2054      Chief Complaint  Patient presents with   Abdominal Pain   Judy Ballard is a 21 y.o. G2P1001 at [redacted]w[redacted]d who receives care at [facility].  She presents today for Abdominal Pain.  She states she has been experiencing RUQ pain since 1500.  She describes the pain as sharp, constant, and worsens with movement and deep breathing.  She denies history of heartburn with this pregnancy, but states she was diagnosed with gallstones in Feb.  She states she did not have a general surgery consult, but was admitted for 2 days.  Patient rates the pain a 10/10.  She endorses fetal movement and denies vaginal concerns or contractions/cramping. She endorses having a headache, but states it is "on and off."  She reports eating burger, fries, and lemonade between 5-6pm.    Patient reports she had an appt today, but missed it.  She states she had an appt last week and has no other scheduled appts.      OB History     Gravida  2   Para  1   Term  1   Preterm      AB      Living  1      SAB      IAB      Ectopic      Multiple  0   Live Births  1           Past Medical History:  Diagnosis Date   Right knee injury, initial encounter 08/10/2017   S/P tonsillectomy 07/16/2019   Tonsillitis    UTI (urinary tract infection)     Past Surgical History:  Procedure Laterality Date   TONSILLECTOMY Bilateral 07/16/2019   Procedure: TONSILLECTOMY;  Surgeon: Izora Gala, MD;  Location: [facility];  Service: ENT;  Laterality: Bilateral;   UMBILICAL HERNIA REPAIR      No family history on file.  Social History   Tobacco Use   Smoking status: Never   Smokeless tobacco: Never  Vaping Use   Vaping Use: Never used  Substance Use Topics   Alcohol use: No   Drug use: Not Currently    Allergies:  Allergies   Allergen Reactions   Apple Juice Hives    Swollen lips   Fish Allergy Hives    tilapia     Medications Prior to Admission  Medication Sig Dispense Refill Last Dose   cefadroxil (DURICEF) 500 MG capsule Take 1 capsule (500 mg total) by mouth 2 (two) times daily. 20 capsule 0    tamsulosin (FLOMAX) 0.4 MG CAPS capsule Take 1 capsule (0.4 mg total) by mouth daily. 30 capsule 0     Review of Systems  Constitutional:  Negative for chills and fever.  Respiratory:  Negative for shortness of breath.   Gastrointestinal:  Positive for abdominal pain. Negative for constipation, diarrhea, nausea and vomiting.  Genitourinary:  Negative for difficulty urinating, dysuria, vaginal bleeding and vaginal discharge.  Musculoskeletal:  Negative for back pain.  Neurological:  Negative for dizziness, light-headedness and headaches.  Physical Exam   Blood pressure 129/79, pulse (!) 113, temperature 98.1 F (36.7 C), temperature source Oral, resp. rate (!) 22, height 5\' 3"  (1.6 m), weight 90.4 kg, last menstrual period 12/05/2020, SpO2 100 %, unknown if currently breastfeeding. Vitals:  10/01/21 2100 10/01/21 2101 10/01/21 2116 10/01/21 2130  BP: 123/71 122/69 120/72 140/81  Pulse: (!) 102 (!) 104 (!) 101 100  Resp:      Temp:      TempSrc:      SpO2: 98%  99% 100%  Weight:      Height:        Physical Exam Vitals reviewed. Exam conducted with a chaperone present.  Constitutional:      Appearance: Normal appearance. She is well-developed.  HENT:     Head: Normocephalic and atraumatic.  Eyes:     Conjunctiva/sclera: Conjunctivae normal.  Cardiovascular:     Rate and Rhythm: Normal rate and regular rhythm.  Pulmonary:     Effort: Pulmonary effort is normal. No respiratory distress.     Breath sounds: Normal breath sounds.  Abdominal:     General: Bowel sounds are normal.     Palpations: Abdomen is soft.     Tenderness: There is abdominal tenderness in the right upper quadrant.  Skin:     General: Skin is warm and dry.  Neurological:     Mental Status: She is alert and oriented to person, place, and time.  Psychiatric:        Mood and Affect: Mood normal.        Behavior: Behavior normal.    Fetal Assessment 145 bpm, Mod Var, -Decels, +Accels Toco: Irregular  MAU Course   Results for orders placed or performed during the hospital encounter of 10/01/21 (from the past 24 hour(s))  Urinalysis, Routine w reflex microscopic Urine, Clean Catch     Status: Abnormal   Collection Time: 10/01/21  8:27 PM  Result Value Ref Range   Color, Urine YELLOW YELLOW   APPearance HAZY (A) CLEAR   Specific Gravity, Urine 1.011 1.005 - 1.030   pH 6.0 5.0 - 8.0   Glucose, UA NEGATIVE NEGATIVE mg/dL   Hgb urine dipstick NEGATIVE NEGATIVE   Bilirubin Urine NEGATIVE NEGATIVE   Ketones, ur NEGATIVE NEGATIVE mg/dL   Protein, ur NEGATIVE NEGATIVE mg/dL   Nitrite NEGATIVE NEGATIVE   Leukocytes,Ua NEGATIVE NEGATIVE  Protein / creatinine ratio, urine     Status: Abnormal   Collection Time: 10/01/21  8:36 PM  Result Value Ref Range   Creatinine, Urine 106.56 mg/dL   Total Protein, Urine 17 mg/dL   Protein Creatinine Ratio 0.16 (H) 0.00 - 0.15 mg/mg[Cre]  Comprehensive metabolic panel     Status: Abnormal   Collection Time: 10/01/21  8:47 PM  Result Value Ref Range   Sodium 138 135 - 145 mmol/L   Potassium 3.3 (L) 3.5 - 5.1 mmol/L   Chloride 108 98 - 111 mmol/L   CO2 20 (L) 22 - 32 mmol/L   Glucose, Bld 97 70 - 99 mg/dL   BUN <5 (L) 6 - 20 mg/dL   Creatinine, Ser 0.57 0.44 - 1.00 mg/dL   Calcium 9.0 8.9 - 10.3 mg/dL   Total Protein 6.1 (L) 6.5 - 8.1 g/dL   Albumin 2.7 (L) 3.5 - 5.0 g/dL   AST 18 15 - 41 U/L   ALT 8 0 - 44 U/L   Alkaline Phosphatase 165 (H) 38 - 126 U/L   Total Bilirubin 0.6 0.3 - 1.2 mg/dL   GFR, Estimated >60 >60 mL/min   Anion gap 10 5 - 15   US ABDOMEN LIMITED RUQ (LIVER/GB)  Result Date: 10/01/2021 CLINICAL DATA:  Right upper quadrant pain.  History of  gallstones. EXAM: ULTRASOUND ABDOMEN LIMITED  RIGHT UPPER QUADRANT COMPARISON:  Renal and abdominal ultrasound both 09/10/2021 FINDINGS: Gallbladder: There is diffuse acoustical shadowing at the gallbladder fossa. The gallbladder wall is not seen. The shadowing could be due to the gallbladder being filled with stones or the gallbladder could be absent with bowel insinuating into the area creating acoustical shadowing. There is a positive sonographic Murphy's sign. No free fluid is seen in the area. Common bile duct: Diameter: 3.3 mm.  No intrahepatic biliary dilatation is seen. Liver: No focal lesion identified. There is increased hepatic echogenicity consistent with steatosis. Portal vein is patent on color Doppler imaging with normal direction of blood flow towards the liver. Other: Right kidney is 12.5 cm in length and again demonstrates severe hydronephrosis. There is increased cortical thinning compared to the prior study but no increased cortical echogenicity. IMPRESSION: 1. Diffuse shadowing at the gallbladder fossa. The gallbladder wall is not seen and was only minimally visible on last month's ultrasound. This could be due to the gallbladder being packed with stones or due to bowel gas insinuating into the area. Available surgical history does not indicate an interval cholecystectomy. There is a positive sonographic Murphy sign. Cholecystitis can't be excluded. 2. Increased liver echogenicity of steatosis. 3. Severe hydronephrosis is similar to the ultrasound of 09/10/2021 but there is increased cortical thinning consistent with interval volume loss. Further evaluation recommended. Electronically Signed   By: Telford Nab M.D.   On: 10/01/2021 22:39    MDM Physical Exam Labs: CBC, CMP, PC Ratio Measure BPQ15 min EFM Pain Management Assessment and Plan  21 year old G2P1001  SIUP at 39.2 weeks Cat I FT Cholecystitis  -Records including prenatal visits and labs from [facility] reviewed in  [place]. -POC Reviewed -Exam performed and findings discussed. -Labs ordered. -Informed that with known cholecystitis will plan to treat pain and reassess. -Discussed collecting PreE Labs, but reassured that labs from yesterday (at [facility]) are normal and bp today are normotensive. -NST Reactive -Will give Vicodin and if pain remains will send for repeat US.  Maryann Conners MSN, CNM 10/01/2021, 8:54 PM   Reassessment (9:44 PM)  -Patient reports no relief from pain medication.  -Will send for [place] and await results.   Reassessment (10:49 PM)  -[place] results as above. -Dr. Elray Mcgregor consulted and informed of patient status, evaluation, interventions, and results. Advised: *Patient needs to be delivered. *Due to L&D acuity will try to transfer or schedule for follow up with primary ob provider at [facility]. *Will coordinate and update provider. -Patient updated on results and potential POC.  Reassessment (11:35 PM) -Dr. Damita Dunnings reports patient to be discharged and to follow up with primary office in AM.  Reports spoke with Dr. Kern Alberta who will coordinate care accordingly. -Provider to bedside to inform of discharge and need to follow up tomorrow. -Addressed questions to best of knowledge cautioning patient that this provider is unfamiliar with protocol and practices of her provider which will plan a role in POC. -Patient tearful stating she has difficulty getting to [facility] and that is why she missed her appt today. -Comfort given. -Encouraged to call primary office or return to MAU if symptoms worsen or with the onset of new symptoms. -Discharged to home in stable condition.  Maryann Conners MSN, CNM Advanced Practice Provider, [facility] [employer] [facility]

## 2021-10-02 ENCOUNTER — Encounter (HOSPITAL_COMMUNITY): Payer: Medicaid Other

## 2021-10-02 DIAGNOSIS — Z3A39 39 weeks gestation of pregnancy: Secondary | ICD-10-CM | POA: Diagnosis not present

## 2021-10-02 DIAGNOSIS — R1011 Right upper quadrant pain: Secondary | ICD-10-CM | POA: Diagnosis not present

## 2021-10-02 DIAGNOSIS — O26893 Other specified pregnancy related conditions, third trimester: Secondary | ICD-10-CM | POA: Diagnosis not present

## 2021-10-03 DIAGNOSIS — O471 False labor at or after 37 completed weeks of gestation: Secondary | ICD-10-CM | POA: Diagnosis not present

## 2021-10-03 DIAGNOSIS — Z3A39 39 weeks gestation of pregnancy: Secondary | ICD-10-CM | POA: Diagnosis not present

## 2021-10-04 ENCOUNTER — Inpatient Hospital Stay (HOSPITAL_COMMUNITY): Payer: Medicaid Other | Admitting: Anesthesiology

## 2021-10-04 ENCOUNTER — Other Ambulatory Visit: Payer: Self-pay

## 2021-10-04 ENCOUNTER — Encounter (HOSPITAL_COMMUNITY): Payer: Self-pay | Admitting: Obstetrics & Gynecology

## 2021-10-04 ENCOUNTER — Inpatient Hospital Stay (HOSPITAL_COMMUNITY)
Admission: AD | Admit: 2021-10-04 | Discharge: 2021-10-07 | DRG: 787 | Disposition: A | Discharge status: Home / Self Care | Payer: Medicaid Other | Attending: Obstetrics & Gynecology | Admitting: Obstetrics & Gynecology

## 2021-10-04 DIAGNOSIS — O9081 Anemia of the puerperium: Secondary | ICD-10-CM | POA: Diagnosis not present

## 2021-10-04 DIAGNOSIS — K8020 Calculus of gallbladder without cholecystitis without obstruction: Secondary | ICD-10-CM | POA: Diagnosis not present

## 2021-10-04 DIAGNOSIS — Z3A39 39 weeks gestation of pregnancy: Secondary | ICD-10-CM | POA: Diagnosis not present

## 2021-10-04 DIAGNOSIS — O164 Unspecified maternal hypertension, complicating childbirth: Secondary | ICD-10-CM | POA: Diagnosis not present

## 2021-10-04 DIAGNOSIS — D62 Acute posthemorrhagic anemia: Secondary | ICD-10-CM | POA: Diagnosis not present

## 2021-10-04 DIAGNOSIS — O368190 Decreased fetal movements, unspecified trimester, not applicable or unspecified: Secondary | ICD-10-CM | POA: Diagnosis present

## 2021-10-04 DIAGNOSIS — O368130 Decreased fetal movements, third trimester, not applicable or unspecified: Principal | ICD-10-CM | POA: Diagnosis present

## 2021-10-04 DIAGNOSIS — Z20822 Contact with and (suspected) exposure to covid-19: Secondary | ICD-10-CM | POA: Diagnosis not present

## 2021-10-04 DIAGNOSIS — O09293 Supervision of pregnancy with other poor reproductive or obstetric history, third trimester: Secondary | ICD-10-CM

## 2021-10-04 DIAGNOSIS — O0930 Supervision of pregnancy with insufficient antenatal care, unspecified trimester: Secondary | ICD-10-CM

## 2021-10-04 DIAGNOSIS — Z8632 Personal history of gestational diabetes: Secondary | ICD-10-CM

## 2021-10-04 DIAGNOSIS — O2662 Liver and biliary tract disorders in childbirth: Secondary | ICD-10-CM | POA: Diagnosis not present

## 2021-10-04 DIAGNOSIS — Z98891 History of uterine scar from previous surgery: Secondary | ICD-10-CM

## 2021-10-04 LAB — CBC
HCT: 31.8 % — ABNORMAL LOW (ref 36.0–46.0)
Hemoglobin: 10 g/dL — ABNORMAL LOW (ref 12.0–15.0)
MCH: 26.3 pg (ref 26.0–34.0)
MCHC: 31.4 g/dL (ref 30.0–36.0)
MCV: 83.7 fL (ref 80.0–100.0)
Platelets: 204 10*3/uL (ref 150–400)
RBC: 3.8 MIL/uL — ABNORMAL LOW (ref 3.87–5.11)
WBC: 6.1 10*3/uL (ref 4.0–10.5)
nRBC: 0 % (ref 0.0–0.2)

## 2021-10-04 LAB — RESP PANEL BY RT-PCR (FLU A&B, COVID) ARPGX2
Influenza A by PCR: NEGATIVE
Influenza B by PCR: NEGATIVE
SARS Coronavirus 2 by RT PCR: NEGATIVE

## 2021-10-04 LAB — TYPE AND SCREEN
ABO/RH(D): O POS
Antibody Screen: NEGATIVE

## 2021-10-04 MED ORDER — LACTATED RINGERS IV SOLN: 500.0000 mL | Freq: Once | INTRAVENOUS | Status: DC

## 2021-10-04 MED ORDER — LACTATED RINGERS IV SOLN: INTRAVENOUS | Status: DC

## 2021-10-04 MED ORDER — OXYTOCIN BOLUS FROM INFUSION: 333.0000 mL | Freq: Once | INTRAVENOUS | Status: DC

## 2021-10-04 MED ORDER — LACTATED RINGERS AMNIOINFUSION
INTRAVENOUS | Status: DC
  Administered 2021-10-04: 125 mL via INTRAUTERINE

## 2021-10-04 MED ORDER — OXYCODONE-ACETAMINOPHEN 5-325 MG PO TABS: 1.0000 | ORAL_TABLET | ORAL | Status: DC | PRN

## 2021-10-04 MED ORDER — LACTATED RINGERS IV SOLN: 500.0000 mL | INTRAVENOUS | Status: DC | PRN

## 2021-10-04 MED ORDER — OXYTOCIN-SODIUM CHLORIDE 30-0.9 UT/500ML-% IV SOLN
1.0000 m[IU]/min | INTRAVENOUS | Status: DC
  Administered 2021-10-04: 2 m[IU]/min via INTRAVENOUS

## 2021-10-04 MED ORDER — LIDOCAINE HCL (PF) 1 % IJ SOLN
INTRAMUSCULAR | Status: DC | PRN
  Administered 2021-10-04: 11 mL via EPIDURAL

## 2021-10-04 MED ORDER — FLEET ENEMA 7-19 GM/118ML RE ENEM: 1.0000 | ENEMA | RECTAL | Status: DC | PRN

## 2021-10-04 MED ORDER — FENTANYL-BUPIVACAINE-NACL 0.5-0.125-0.9 MG/250ML-% EP SOLN
12.0000 mL/h | EPIDURAL | Status: DC | PRN
  Administered 2021-10-04: 12 mL/h via EPIDURAL
  Filled 2021-10-04: qty 250

## 2021-10-04 MED ORDER — EPHEDRINE 5 MG/ML INJ: 10.0000 mg | INTRAVENOUS | Status: DC | PRN

## 2021-10-04 MED ORDER — DIPHENHYDRAMINE HCL 50 MG/ML IJ SOLN: 12.5000 mg | INTRAMUSCULAR | Status: DC | PRN

## 2021-10-04 MED ORDER — ACETAMINOPHEN 325 MG PO TABS: 650.0000 mg | ORAL_TABLET | ORAL | Status: DC | PRN

## 2021-10-04 MED ORDER — TERBUTALINE SULFATE 1 MG/ML IJ SOLN
0.2500 mg | Freq: Once | INTRAMUSCULAR | Status: AC | PRN
  Administered 2021-10-04: 0.25 mg via SUBCUTANEOUS
  Filled 2021-10-04: qty 1

## 2021-10-04 MED ORDER — OXYTOCIN-SODIUM CHLORIDE 30-0.9 UT/500ML-% IV SOLN
2.5000 [IU]/h | INTRAVENOUS | Status: DC
  Administered 2021-10-05: 2.5 [IU]/h via INTRAVENOUS
  Filled 2021-10-04: qty 500

## 2021-10-04 MED ORDER — LIDOCAINE HCL (PF) 1 % IJ SOLN: 30.0000 mL | INTRAMUSCULAR | Status: DC | PRN

## 2021-10-04 MED ORDER — FENTANYL CITRATE (PF) 100 MCG/2ML IJ SOLN
100.0000 ug | INTRAMUSCULAR | Status: DC | PRN
  Administered 2021-10-04: 100 ug via INTRAVENOUS
  Filled 2021-10-04 (×2): qty 2

## 2021-10-04 MED ORDER — PHENYLEPHRINE 40 MCG/ML (10ML) SYRINGE FOR IV PUSH (FOR BLOOD PRESSURE SUPPORT)
80.0000 ug | PREFILLED_SYRINGE | INTRAVENOUS | Status: DC | PRN
  Filled 2021-10-04: qty 10

## 2021-10-04 MED ORDER — OXYCODONE-ACETAMINOPHEN 5-325 MG PO TABS: 2.0000 | ORAL_TABLET | ORAL | Status: DC | PRN

## 2021-10-04 MED ORDER — ONDANSETRON HCL 4 MG/2ML IJ SOLN
4.0000 mg | Freq: Four times a day (QID) | INTRAMUSCULAR | Status: DC | PRN
  Administered 2021-10-04 – 2021-10-05 (×2): 4 mg via INTRAVENOUS
  Filled 2021-10-04 (×2): qty 2

## 2021-10-04 MED ORDER — SOD CITRATE-CITRIC ACID 500-334 MG/5ML PO SOLN
30.0000 mL | ORAL | Status: DC | PRN
Start: 2021-10-04 — End: 2021-10-05

## 2021-10-04 MED ORDER — PHENYLEPHRINE 40 MCG/ML (10ML) SYRINGE FOR IV PUSH (FOR BLOOD PRESSURE SUPPORT): 80.0000 ug | PREFILLED_SYRINGE | INTRAVENOUS | Status: DC | PRN

## 2021-10-04 NOTE — H&P (Signed)
OBSTETRIC ADMISSION HISTORY AND PHYSICAL ? ?Judy Ballard is a 21 y.o. female G2P1001 with IUP at [redacted]w[redacted]d by LMP presenting for IOL for decreased fetal movement at term. She reports No LOF, no VB, no blurry vision, headaches or peripheral edema, and RUQ pain.  She plans on bottle feeding. She is unsure what she desires for birth control. ?She received her prenatal care at  [facility]   ? ?Dating: By LMP --->  Estimated Date of Delivery: 10/06/21 ? ?Prenatal History/Complications: gallstones, hx gHTN and GDM in previous pregnancy ? ?Past Medical History: ?Past Medical History:  ?Diagnosis Date  ? Right knee injury, initial encounter 08/10/2017  ? S/P tonsillectomy 07/16/2019  ? Tonsillitis   ? UTI (urinary tract infection)   ? ? ?Past Surgical History: ?Past Surgical History:  ?Procedure Laterality Date  ? TONSILLECTOMY Bilateral 07/16/2019  ? Procedure: TONSILLECTOMY;  Surgeon: Izora Gala, MD;  [facility];  Service: ENT;  Laterality: Bilateral;  ? UMBILICAL HERNIA REPAIR    ? ? ?Obstetrical History: ?OB History   ? ? Gravida  ?2  ? Para  ?1  ? Term  ?1  ? Preterm  ?   ? AB  ?   ? Living  ?1  ?  ? ? SAB  ?   ? IAB  ?   ? Ectopic  ?   ? Multiple  ?0  ? Live Births  ?1  ?   ?  ?  ? ? ?Social History ?Social History  ? ?Socioeconomic History  ? Marital status: Single  ?  Spouse name: Not on file  ? Number of children: Not on file  ? Years of education: Not on file  ? Highest education level: Not on file  ?Occupational History  ? Not on file  ?Tobacco Use  ? Smoking status: Never  ? Smokeless tobacco: Never  ?Vaping Use  ? Vaping Use: Never used  ?Substance and Sexual Activity  ? Alcohol use: No  ? Drug use: Not Currently  ? Sexual activity: Not Currently  ?Other Topics Concern  ? Not on file  ?Social History Narrative  ? Not on file  ? ?Social Determinants of Health  ? ?Financial Resource Strain: Not on file  ?Food Insecurity: Not on file  ?Transportation Needs: Not on file  ?Physical Activity:  Not on file  ?Stress: Not on file  ?Social Connections: Not on file  ? ? ?Family History: ?History reviewed. No pertinent family history. ? ?Allergies: ?Allergies  ?Allergen Reactions  ? Apple Juice Hives  ?  Swollen lips  ? Fish Allergy Hives  ?  tilapia ?  ? ? ?Medications Prior to Admission  ?Medication Sig Dispense Refill Last Dose  ? cefadroxil (DURICEF) 500 MG capsule Take 1 capsule (500 mg total) by mouth 2 (two) times daily. 20 capsule 0   ? tamsulosin (FLOMAX) 0.4 MG CAPS capsule Take 1 capsule (0.4 mg total) by mouth daily. 30 capsule 0   ? ? ? ?Review of Systems  ? ?All systems reviewed and negative except as stated in HPI ? ?Blood pressure 116/74, pulse 100, temperature 98.7 ?F (37.1 ?C), temperature source Oral, resp. rate 18, height 5\' 3"  (1.6 m), weight 89.1 kg, last menstrual period 12/05/2020, SpO2 99 %, unknown if currently breastfeeding. ?General appearance: alert, cooperative, and no distress ?Lungs: clear to auscultation bilaterally ?Heart: regular rate and rhythm ?Abdomen: soft, non-tender; bowel sounds normal ?Pelvic: n/a ?Extremities: Homans sign is negative, no sign of DVT ?DTR's +  2 ?Presentation: cephalic ?Fetal monitoringBaseline: 130 bpm, Variability: Good {> 6 bpm), Accelerations: Reactive, and Decelerations: Absent ?Uterine activity: occasional uc's ?Dilation: 3 ?Effacement (%): 80 ?Station: Ballotable ?Exam by:: Len Blalock, CNM ? ? ?Prenatal labs: see CareEverywhere ?ABO, Rh: --/--/O POS (02/16 1807) ?Antibody: NEG (02/16 1807) ?Rubella:   ?RPR:    ?HBsAg:    ?HIV:    ?GBS:   negative ? ?Prenatal Transfer Tool  ?Maternal Diabetes: No ?Genetic Screening: Normal ?Maternal Ultrasounds/Referrals: Normal ?Fetal Ultrasounds or other Referrals:  None ?Maternal Substance Abuse:  No ?Significant Maternal Medications:  None ?Significant Maternal Lab Results: Group B Strep negative ? ?No results found for this or any previous visit (from the past 24 hour(s)). ? ?Patient Active Problem List  ?  Diagnosis Date Noted  ? Decreased fetal movement 10/04/2021  ? Pyelonephritis affecting pregnancy 09/10/2021  ? Gallstones 09/10/2021  ? Anemia affecting pregnancy in third trimester 09/10/2021  ? UTI in pregnancy, antepartum, first trimester 09/10/2021  ? Short interval between pregnancies affecting pregnancy in third trimester, antepartum 09/10/2021  ? History of gestational hypertension 08/13/2020  ? ? ?Assessment/Plan:  ?Judy Ballard is a 21 y.o. G2P1001 at [redacted]w[redacted]d here for IOL for decreased fetal movement at term ? ?#Labor: Discussed options for augmentation at length. Will start pitocin 2x2 and AROM when baby is more applied in the pelvis ? ?Bedside ultrasound used to confirm vertex presentation ? ?#Pain: Planning epidural ?#FWB: Cat 1 ?#ID:  GBS neg ?#MOF: bottle ?#MOC: unsure ?#Circ:  yes ? ?Wende Mott, CNM  ?10/04/2021, 4:40 PM ? ? ? ? ?

## 2021-10-04 NOTE — Anesthesia Preprocedure Evaluation (Addendum)
Anesthesia Evaluation  ?Patient identified by MRN, date of birth, ID band ?Patient awake ? ? ? ?Reviewed: ?Allergy & Precautions, Patient's Chart, lab work & pertinent test results ? ?Airway ?Mallampati: II ? ?TM Distance: >3 FB ?Neck ROM: Full ? ? ? Dental ?no notable dental hx. ? ?  ?Pulmonary ?neg pulmonary ROS,  ?  ?Pulmonary exam normal ?breath sounds clear to auscultation ? ? ? ? ? ? Cardiovascular ?hypertension, Normal cardiovascular exam ?Rhythm:Regular Rate:Normal ? ? ?  ?Neuro/Psych ?negative neurological ROS ? negative psych ROS  ? GI/Hepatic ?Neg liver ROS, GERD  Medicated and Controlled,  ?Endo/Other  ?negative endocrine ROS ? Renal/GU ?negative Renal ROS  ?negative genitourinary ?  ?Musculoskeletal ?negative musculoskeletal ROS ?(+)  ? Abdominal ?  ?Peds ? Hematology ? ?(+) Blood dyscrasia, anemia , hct 29, plt 441   ?Anesthesia Other Findings ? ? Reproductive/Obstetrics ?(+) Pregnancy ? ?  ? ? ? ? ? ? ? ? ? ? ? ? ? ?  ?  ? ? ? ? ? ? ? ? ?Anesthesia Physical ? ?Anesthesia Plan ? ?ASA: II and emergent ? ?Anesthesia Plan: Epidural  ? ?Post-op Pain Management:   ? ?Induction:  ? ?PONV Risk Score and Plan: 2 ? ?Airway Management Planned: Natural Airway ? ?Additional Equipment: None ? ?Intra-op Plan:  ? ?Post-operative Plan:  ? ?Informed Consent: I have reviewed the patients History and Physical, chart, labs and discussed the procedure including the risks, benefits and alternatives for the proposed anesthesia with the patient or authorized representative who has indicated his/her understanding and acceptance.  ? ? ? ? ? ?Plan Discussed with:  ? ?Anesthesia Plan Comments:   ? ? ? ? ? ?Anesthesia Quick Evaluation ? ?

## 2021-10-04 NOTE — Anesthesia Procedure Notes (Signed)
Epidural ?Patient location during procedure: OB ?Start time: 10/04/2021 8:00 PM ?End time: 10/04/2021 8:14 PM ? ?Staffing ?Anesthesiologist: Lowella Curb, MD ?Performed: anesthesiologist  ? ?Preanesthetic Checklist ?Completed: patient identified, IV checked, site marked, risks and benefits discussed, surgical consent, monitors and equipment checked, pre-op evaluation and timeout performed ? ?Epidural ?Patient position: sitting ?Prep: ChloraPrep ?Patient monitoring: heart rate, cardiac monitor, continuous pulse ox and blood pressure ?Approach: midline ?Location: L2-L3 ?Injection technique: LOR saline ? ?Needle:  ?Needle type: Tuohy  ?Needle gauge: 17 G ?Needle length: 9 cm ?Needle insertion depth: 6 cm ?Catheter type: closed end flexible ?Catheter size: 20 Guage ?Catheter at skin depth: 10 cm ?Test dose: negative ? ?Assessment ?Events: blood not aspirated, injection not painful, no injection resistance, no paresthesia and negative IV test ? ?Additional Notes ?Reason for block:procedure for pain ? ? ? ?

## 2021-10-04 NOTE — MAU Note (Signed)
Judy Ballard is a 21 y.o. at [redacted]w[redacted]d here in MAU reporting: hasn't felt her baby move since around 62 this morning. Having some pain in lower abd, coming every 3-4 min. Was 1+ last night. No bleeding or leaking ? ?Onset of complaint: 1100 ?Pain score: 9 ?Vitals:  ? 10/04/21 1555  ?BP: 129/82  ?Pulse: 95  ?Resp: 18  ?Temp: 98.7 ?F (37.1 ?C)  ?SpO2: 97%  ?   ?FHT:144 ?Lab orders placed from triage:  none ?

## 2021-10-04 NOTE — Progress Notes (Signed)
Called by RN that patient had SROM with a large amount of clear fluid and has been feeling more pressure.  ? ?On cervical check, patient now about 6cm. During this time, noted recurrent deep variables despite position changes. Pit stopped. Cervical recheck without cord, but now about 6.5cm with improving fetal station. FSE placed.  ? ?Shortly after had a prolonged deceleration that resolved with hands and knees positioning. Given Terb with freq ctx. IUPC with amnioinfusion placed as she was still having variables with contractions.  ? ?After about 10 minutes, FHT significantly improved. Now reassuring with baseline 125/mod/early decels. Will monitor closely.  ? ?Allayne Stack, DO  ?

## 2021-10-05 ENCOUNTER — Inpatient Hospital Stay (HOSPITAL_COMMUNITY): Payer: Medicaid Other

## 2021-10-05 ENCOUNTER — Encounter (HOSPITAL_COMMUNITY): Payer: Self-pay | Admitting: Obstetrics & Gynecology

## 2021-10-05 ENCOUNTER — Encounter (HOSPITAL_COMMUNITY)
Admission: AD | Disposition: A | Discharge status: Home / Self Care | Payer: Self-pay | Source: Home / Self Care | Attending: Obstetrics & Gynecology

## 2021-10-05 DIAGNOSIS — O368130 Decreased fetal movements, third trimester, not applicable or unspecified: Secondary | ICD-10-CM | POA: Diagnosis not present

## 2021-10-05 DIAGNOSIS — O43813 Placental infarction, third trimester: Secondary | ICD-10-CM | POA: Diagnosis not present

## 2021-10-05 DIAGNOSIS — Z3A00 Weeks of gestation of pregnancy not specified: Secondary | ICD-10-CM | POA: Diagnosis not present

## 2021-10-05 DIAGNOSIS — Z3A39 39 weeks gestation of pregnancy: Secondary | ICD-10-CM | POA: Diagnosis not present

## 2021-10-05 DIAGNOSIS — O411230 Chorioamnionitis, third trimester, not applicable or unspecified: Secondary | ICD-10-CM | POA: Diagnosis not present

## 2021-10-05 DIAGNOSIS — R935 Abnormal findings on diagnostic imaging of other abdominal regions, including retroperitoneum: Secondary | ICD-10-CM | POA: Diagnosis not present

## 2021-10-05 LAB — RPR: RPR Ser Ql: NONREACTIVE

## 2021-10-05 LAB — CBC
HCT: 27.1 % — ABNORMAL LOW (ref 36.0–46.0)
Hemoglobin: 9 g/dL — ABNORMAL LOW (ref 12.0–15.0)
MCH: 27.5 pg (ref 26.0–34.0)
MCHC: 33.2 g/dL (ref 30.0–36.0)
MCV: 82.9 fL (ref 80.0–100.0)
Platelets: 179 10*3/uL (ref 150–400)
RBC: 3.27 MIL/uL — ABNORMAL LOW (ref 3.87–5.11)
WBC: 13 10*3/uL — ABNORMAL HIGH (ref 4.0–10.5)
nRBC: 0 % (ref 0.0–0.2)

## 2021-10-05 SURGERY — Surgical Case
Anesthesia: Epidural | Site: Abdomen | Wound class: Clean Contaminated

## 2021-10-05 MED ORDER — OXYCODONE HCL 5 MG/5ML PO SOLN: 5.0000 mg | Freq: Once | ORAL | Status: DC | PRN

## 2021-10-05 MED ORDER — ONDANSETRON HCL 4 MG/2ML IJ SOLN
INTRAMUSCULAR | Status: AC
  Filled 2021-10-05: qty 2

## 2021-10-05 MED ORDER — ONDANSETRON HCL 4 MG/2ML IJ SOLN
INTRAMUSCULAR | Status: DC | PRN
  Administered 2021-10-05: 4 mg via INTRAVENOUS

## 2021-10-05 MED ORDER — OXYTOCIN-SODIUM CHLORIDE 30-0.9 UT/500ML-% IV SOLN
INTRAVENOUS | Status: AC
  Filled 2021-10-05: qty 500

## 2021-10-05 MED ORDER — CEFAZOLIN SODIUM-DEXTROSE 2-4 GM/100ML-% IV SOLN
INTRAVENOUS | Status: AC
  Filled 2021-10-05: qty 100

## 2021-10-05 MED ORDER — COCONUT OIL OIL: 1.0000 "application " | TOPICAL_OIL | Status: DC | PRN

## 2021-10-05 MED ORDER — ACETAMINOPHEN 500 MG PO TABS
1000.0000 mg | ORAL_TABLET | ORAL | Status: DC | PRN
  Administered 2021-10-05 – 2021-10-06 (×2): 1000 mg via ORAL
  Filled 2021-10-05 (×2): qty 2

## 2021-10-05 MED ORDER — DEXAMETHASONE SODIUM PHOSPHATE 4 MG/ML IJ SOLN
INTRAMUSCULAR | Status: DC | PRN
  Administered 2021-10-05: 4 mg via INTRAVENOUS

## 2021-10-05 MED ORDER — ENOXAPARIN SODIUM 40 MG/0.4ML IJ SOSY
40.0000 mg | PREFILLED_SYRINGE | INTRAMUSCULAR | Status: DC
  Administered 2021-10-06 – 2021-10-07 (×2): 40 mg via SUBCUTANEOUS
  Filled 2021-10-05 (×2): qty 0.4

## 2021-10-05 MED ORDER — DIBUCAINE (PERIANAL) 1 % EX OINT: 1.0000 "application " | TOPICAL_OINTMENT | CUTANEOUS | Status: DC | PRN

## 2021-10-05 MED ORDER — FENTANYL CITRATE (PF) 100 MCG/2ML IJ SOLN
INTRAMUSCULAR | Status: AC
  Filled 2021-10-05: qty 2

## 2021-10-05 MED ORDER — MORPHINE SULFATE (PF) 0.5 MG/ML IJ SOLN
INTRAMUSCULAR | Status: DC | PRN
  Administered 2021-10-05: 3 mg via EPIDURAL

## 2021-10-05 MED ORDER — SODIUM CHLORIDE 0.9% FLUSH: 3.0000 mL | INTRAVENOUS | Status: DC | PRN

## 2021-10-05 MED ORDER — WITCH HAZEL-GLYCERIN EX PADS: 1.0000 "application " | MEDICATED_PAD | CUTANEOUS | Status: DC | PRN

## 2021-10-05 MED ORDER — KETOROLAC TROMETHAMINE 30 MG/ML IJ SOLN
INTRAMUSCULAR | Status: AC
  Filled 2021-10-05: qty 1

## 2021-10-05 MED ORDER — CEFAZOLIN SODIUM-DEXTROSE 2-3 GM-%(50ML) IV SOLR
INTRAVENOUS | Status: DC | PRN
  Administered 2021-10-05: 2 g via INTRAVENOUS

## 2021-10-05 MED ORDER — SODIUM CHLORIDE 0.9 % IV SOLN: INTRAVENOUS | Status: DC | PRN

## 2021-10-05 MED ORDER — HYDROMORPHONE HCL 1 MG/ML IJ SOLN: 0.2500 mg | INTRAMUSCULAR | Status: DC | PRN

## 2021-10-05 MED ORDER — SODIUM CHLORIDE 0.9 % IR SOLN
Status: DC | PRN
  Administered 2021-10-05: 1

## 2021-10-05 MED ORDER — FENTANYL CITRATE (PF) 100 MCG/2ML IJ SOLN
INTRAMUSCULAR | Status: DC | PRN
  Administered 2021-10-05 (×2): 50 ug via INTRAVENOUS

## 2021-10-05 MED ORDER — DIPHENHYDRAMINE HCL 25 MG PO CAPS: 25.0000 mg | ORAL_CAPSULE | Freq: Four times a day (QID) | ORAL | Status: DC | PRN

## 2021-10-05 MED ORDER — PHENYLEPHRINE HCL (PRESSORS) 10 MG/ML IV SOLN
INTRAVENOUS | Status: DC | PRN
  Administered 2021-10-05: 80 ug via INTRAVENOUS

## 2021-10-05 MED ORDER — ACETAMINOPHEN 10 MG/ML IV SOLN
INTRAVENOUS | Status: AC
  Filled 2021-10-05: qty 100

## 2021-10-05 MED ORDER — KETOROLAC TROMETHAMINE 30 MG/ML IJ SOLN
30.0000 mg | Freq: Once | INTRAMUSCULAR | Status: AC | PRN
  Administered 2021-10-05: 30 mg via INTRAVENOUS

## 2021-10-05 MED ORDER — SIMETHICONE 80 MG PO CHEW
80.0000 mg | CHEWABLE_TABLET | Freq: Three times a day (TID) | ORAL | Status: DC
  Administered 2021-10-05 – 2021-10-07 (×7): 80 mg via ORAL
  Filled 2021-10-05 (×7): qty 1

## 2021-10-05 MED ORDER — TERBUTALINE SULFATE 1 MG/ML IJ SOLN
0.2500 mg | Freq: Once | INTRAMUSCULAR | Status: AC
  Administered 2021-10-05: 0.25 mg via SUBCUTANEOUS

## 2021-10-05 MED ORDER — MENTHOL 3 MG MT LOZG: 1.0000 | LOZENGE | OROMUCOSAL | Status: DC | PRN

## 2021-10-05 MED ORDER — CHLOROPROCAINE HCL (PF) 3 % IJ SOLN
INTRAMUSCULAR | Status: DC | PRN
  Administered 2021-10-05: 10 mL

## 2021-10-05 MED ORDER — FERROUS SULFATE 325 (65 FE) MG PO TABS: 325.0000 mg | ORAL_TABLET | ORAL | Status: DC

## 2021-10-05 MED ORDER — LACTATED RINGERS IV SOLN: INTRAVENOUS | Status: DC

## 2021-10-05 MED ORDER — OXYCODONE HCL 5 MG PO TABS: 5.0000 mg | ORAL_TABLET | Freq: Once | ORAL | Status: DC | PRN

## 2021-10-05 MED ORDER — ONDANSETRON HCL 4 MG/2ML IJ SOLN
4.0000 mg | Freq: Once | INTRAMUSCULAR | Status: AC
  Administered 2021-10-05: 4 mg via INTRAVENOUS
  Filled 2021-10-05: qty 2

## 2021-10-05 MED ORDER — SCOPOLAMINE 1 MG/3DAYS TD PT72
MEDICATED_PATCH | TRANSDERMAL | Status: DC | PRN
  Administered 2021-10-05: 1 via TRANSDERMAL

## 2021-10-05 MED ORDER — TRANEXAMIC ACID-NACL 1000-0.7 MG/100ML-% IV SOLN
INTRAVENOUS | Status: AC
  Filled 2021-10-05: qty 100

## 2021-10-05 MED ORDER — SODIUM CHLORIDE 0.9 % IV SOLN
INTRAVENOUS | Status: DC | PRN
  Administered 2021-10-05: 500 mg via INTRAVENOUS

## 2021-10-05 MED ORDER — ACETAMINOPHEN 325 MG PO TABS
650.0000 mg | ORAL_TABLET | ORAL | Status: DC | PRN
  Filled 2021-10-05: qty 2

## 2021-10-05 MED ORDER — SIMETHICONE 80 MG PO CHEW: 80.0000 mg | CHEWABLE_TABLET | ORAL | Status: DC | PRN

## 2021-10-05 MED ORDER — STERILE WATER FOR IRRIGATION IR SOLN
Status: DC | PRN
  Administered 2021-10-05: 1

## 2021-10-05 MED ORDER — MEPERIDINE HCL 25 MG/ML IJ SOLN: 6.2500 mg | INTRAMUSCULAR | Status: DC | PRN

## 2021-10-05 MED ORDER — PROMETHAZINE HCL 25 MG/ML IJ SOLN: 6.2500 mg | INTRAMUSCULAR | Status: DC | PRN

## 2021-10-05 MED ORDER — KETOROLAC TROMETHAMINE 30 MG/ML IJ SOLN
30.0000 mg | Freq: Four times a day (QID) | INTRAMUSCULAR | Status: AC
  Administered 2021-10-05 – 2021-10-06 (×3): 30 mg via INTRAVENOUS
  Filled 2021-10-05 (×4): qty 1

## 2021-10-05 MED ORDER — DEXMEDETOMIDINE (PRECEDEX) IN NS 20 MCG/5ML (4 MCG/ML) IV SYRINGE
PREFILLED_SYRINGE | INTRAVENOUS | Status: DC | PRN
  Administered 2021-10-05: 8 ug via INTRAVENOUS

## 2021-10-05 MED ORDER — SENNOSIDES-DOCUSATE SODIUM 8.6-50 MG PO TABS
2.0000 | ORAL_TABLET | Freq: Every day | ORAL | Status: DC
  Administered 2021-10-06 – 2021-10-07 (×2): 2 via ORAL
  Filled 2021-10-05 (×2): qty 2

## 2021-10-05 MED ORDER — CHLOROPROCAINE HCL (PF) 3 % IJ SOLN
INTRAMUSCULAR | Status: AC
  Filled 2021-10-05: qty 20

## 2021-10-05 MED ORDER — OXYTOCIN-SODIUM CHLORIDE 30-0.9 UT/500ML-% IV SOLN
INTRAVENOUS | Status: DC | PRN
  Administered 2021-10-05: 30 [IU] via INTRAVENOUS

## 2021-10-05 MED ORDER — PHENYLEPHRINE 40 MCG/ML (10ML) SYRINGE FOR IV PUSH (FOR BLOOD PRESSURE SUPPORT)
PREFILLED_SYRINGE | INTRAVENOUS | Status: AC
  Filled 2021-10-05: qty 10

## 2021-10-05 MED ORDER — TRANEXAMIC ACID-NACL 1000-0.7 MG/100ML-% IV SOLN
INTRAVENOUS | Status: DC | PRN
  Administered 2021-10-05: 1000 mg via INTRAVENOUS

## 2021-10-05 MED ORDER — DIPHENHYDRAMINE HCL 50 MG/ML IJ SOLN: 12.5000 mg | INTRAMUSCULAR | Status: DC | PRN

## 2021-10-05 MED ORDER — MORPHINE SULFATE (PF) 0.5 MG/ML IJ SOLN
INTRAMUSCULAR | Status: AC
  Filled 2021-10-05: qty 10

## 2021-10-05 MED ORDER — CEFAZOLIN SODIUM-DEXTROSE 2-3 GM-%(50ML) IV SOLR: INTRAVENOUS | Status: DC | PRN

## 2021-10-05 MED ORDER — MEASLES, MUMPS & RUBELLA VAC IJ SOLR: 0.5000 mL | Freq: Once | INTRAMUSCULAR | Status: DC

## 2021-10-05 MED ORDER — NALOXONE HCL 0.4 MG/ML IJ SOLN: 0.4000 mg | INTRAMUSCULAR | Status: DC | PRN

## 2021-10-05 MED ORDER — IBUPROFEN 600 MG PO TABS
600.0000 mg | ORAL_TABLET | Freq: Four times a day (QID) | ORAL | Status: DC
  Administered 2021-10-06 – 2021-10-07 (×5): 600 mg via ORAL
  Filled 2021-10-05 (×6): qty 1

## 2021-10-05 MED ORDER — ONDANSETRON HCL 4 MG/2ML IJ SOLN: 4.0000 mg | Freq: Four times a day (QID) | INTRAMUSCULAR | Status: DC | PRN

## 2021-10-05 MED ORDER — TETANUS-DIPHTH-ACELL PERTUSSIS 5-2.5-18.5 LF-MCG/0.5 IM SUSY: 0.5000 mL | PREFILLED_SYRINGE | Freq: Once | INTRAMUSCULAR | Status: DC

## 2021-10-05 MED ORDER — OXYTOCIN-SODIUM CHLORIDE 30-0.9 UT/500ML-% IV SOLN
INTRAVENOUS | Status: AC
Start: 2021-10-05 — End: ?
  Filled 2021-10-05: qty 500

## 2021-10-05 MED ORDER — OXYCODONE HCL 5 MG PO TABS: 5.0000 mg | ORAL_TABLET | ORAL | Status: DC | PRN

## 2021-10-05 MED ORDER — OXYTOCIN-SODIUM CHLORIDE 30-0.9 UT/500ML-% IV SOLN
2.5000 [IU]/h | INTRAVENOUS | Status: AC
  Administered 2021-10-05: 2.5 [IU]/h via INTRAVENOUS

## 2021-10-05 MED ORDER — DIPHENHYDRAMINE HCL 25 MG PO CAPS: 25.0000 mg | ORAL_CAPSULE | ORAL | Status: DC | PRN

## 2021-10-05 MED ORDER — DEXAMETHASONE SODIUM PHOSPHATE 4 MG/ML IJ SOLN
INTRAMUSCULAR | Status: AC
  Filled 2021-10-05: qty 1

## 2021-10-05 MED ORDER — SODIUM CHLORIDE 0.9 % IV SOLN
INTRAVENOUS | Status: AC
  Filled 2021-10-05: qty 5

## 2021-10-05 MED ORDER — ACETAMINOPHEN 10 MG/ML IV SOLN
INTRAVENOUS | Status: DC | PRN
  Administered 2021-10-05: 1000 mg via INTRAVENOUS

## 2021-10-05 MED ORDER — OXYCODONE HCL 5 MG PO TABS
5.0000 mg | ORAL_TABLET | Freq: Once | ORAL | Status: AC
  Administered 2021-10-05: 5 mg via ORAL
  Filled 2021-10-05: qty 1

## 2021-10-05 MED ORDER — MEDROXYPROGESTERONE ACETATE 150 MG/ML IM SUSP: 150.0000 mg | INTRAMUSCULAR | Status: DC | PRN

## 2021-10-05 MED ORDER — NALOXONE HCL 4 MG/10ML IJ SOLN
1.0000 ug/kg/h | INTRAVENOUS | Status: DC | PRN
  Filled 2021-10-05: qty 5

## 2021-10-05 MED ORDER — TERBUTALINE SULFATE 1 MG/ML IJ SOLN
INTRAMUSCULAR | Status: AC
Start: 2021-10-05 — End: 2021-10-05
  Filled 2021-10-05: qty 1

## 2021-10-05 SURGICAL SUPPLY — 38 items
BENZOIN TINCTURE PRP APPL 2/3 (GAUZE/BANDAGES/DRESSINGS) ×3 IMPLANT
CHLORAPREP W/TINT 26ML (MISCELLANEOUS) ×3 IMPLANT
CLAMP CORD UMBIL (MISCELLANEOUS) IMPLANT
CLOSURE STERI STRIP 1/2 X4 (GAUZE/BANDAGES/DRESSINGS) ×1 IMPLANT
CLOSURE WOUND 1/2 X4 (GAUZE/BANDAGES/DRESSINGS) ×1
CLOTH BEACON ORANGE TIMEOUT ST (SAFETY) ×3 IMPLANT
DERMABOND ADVANCED (GAUZE/BANDAGES/DRESSINGS)
DERMABOND ADVANCED .7 DNX12 (GAUZE/BANDAGES/DRESSINGS) IMPLANT
DRSG OPSITE POSTOP 4X10 (GAUZE/BANDAGES/DRESSINGS) ×3 IMPLANT
ELECT REM PT RETURN 9FT ADLT (ELECTROSURGICAL) ×3
ELECTRODE REM PT RTRN 9FT ADLT (ELECTROSURGICAL) ×1 IMPLANT
EXTRACTOR VACUUM KIWI (MISCELLANEOUS) IMPLANT
GLOVE BIOGEL PI IND STRL 7.0 (GLOVE) ×3 IMPLANT
GLOVE BIOGEL PI INDICATOR 7.0 (GLOVE) ×6
GLOVE ECLIPSE 6.5 STRL STRAW (GLOVE) ×3 IMPLANT
GOWN STRL REUS W/TWL LRG LVL3 (GOWN DISPOSABLE) ×9 IMPLANT
KIT ABG SYR 3ML LUER SLIP (SYRINGE) IMPLANT
NDL HYPO 25X5/8 SAFETYGLIDE (NEEDLE) IMPLANT
NEEDLE HYPO 25X5/8 SAFETYGLIDE (NEEDLE) IMPLANT
NS IRRIG 1000ML POUR BTL (IV SOLUTION) ×3 IMPLANT
PACK C SECTION WH (CUSTOM PROCEDURE TRAY) ×3 IMPLANT
PAD ABD 7.5X8 STRL (GAUZE/BANDAGES/DRESSINGS) ×3 IMPLANT
PAD OB MATERNITY 4.3X12.25 (PERSONAL CARE ITEMS) ×3 IMPLANT
PENCIL SMOKE EVAC W/HOLSTER (ELECTROSURGICAL) ×3 IMPLANT
RTRCTR C-SECT PINK 25CM LRG (MISCELLANEOUS) ×3 IMPLANT
STRIP CLOSURE SKIN 1/2X4 (GAUZE/BANDAGES/DRESSINGS) ×2 IMPLANT
SUT PLAIN 0 NONE (SUTURE) IMPLANT
SUT PLAIN 2 0 XLH (SUTURE) IMPLANT
SUT VIC AB 0 CT1 27 (SUTURE) ×6
SUT VIC AB 0 CT1 27XBRD ANBCTR (SUTURE) ×2 IMPLANT
SUT VIC AB 0 CTX 36 (SUTURE) ×12
SUT VIC AB 0 CTX36XBRD ANBCTRL (SUTURE) ×3 IMPLANT
SUT VIC AB 2-0 CT1 27 (SUTURE) ×3
SUT VIC AB 2-0 CT1 TAPERPNT 27 (SUTURE) ×1 IMPLANT
SUT VIC AB 4-0 KS 27 (SUTURE) ×3 IMPLANT
TOWEL OR 17X24 6PK STRL BLUE (TOWEL DISPOSABLE) ×3 IMPLANT
TRAY FOLEY W/BAG SLVR 14FR LF (SET/KITS/TRAYS/PACK) IMPLANT
WATER STERILE IRR 1000ML POUR (IV SOLUTION) ×3 IMPLANT

## 2021-10-05 NOTE — Transfer of Care (Signed)
Immediate Anesthesia Transfer of Care Note ? ?Patient: Judy Ballard ? ?Procedure(s) Performed: CESAREAN SECTION ? ?Patient Location: PACU ? ?Anesthesia Type:Epidural ? ?Level of Consciousness: awake, alert  and oriented ? ?Airway & Oxygen Therapy: Patient Spontanous Breathing ? ?Post-op Assessment: Report given to RN ? ?Post vital signs: Reviewed and stable ? ?Last Vitals:  ?Vitals Value Taken Time  ?BP 115/72 10/05/21 0430  ?Temp    ?Pulse 92 10/05/21 0432  ?Resp 11 10/05/21 0432  ?SpO2 100 % 10/05/21 0432  ?Vitals shown include unvalidated device data. ? ?Last Pain:  ?Vitals:  ? 10/05/21 0201  ?TempSrc:   ?PainSc: 0-No pain  ?   ? ?  ? ?Complications: No notable events documented. ?

## 2021-10-05 NOTE — Anesthesia Postprocedure Evaluation (Signed)
Anesthesia Post Note ? ?Patient: Judy Ballard ? ?Procedure(s) Performed: CESAREAN SECTION (Abdomen) ? ?  ? ?Patient location during evaluation: PACU ?Anesthesia Type: Epidural ?Level of consciousness: awake and alert ?Pain management: pain level controlled ?Vital Signs Assessment: post-procedure vital signs reviewed and stable ?Respiratory status: spontaneous breathing, nonlabored ventilation and respiratory function stable ?Cardiovascular status: blood pressure returned to baseline and stable ?Postop Assessment: no apparent nausea or vomiting ?Anesthetic complications: no ? ? ?No notable events documented. ? ?Last Vitals:  ?Vitals:  ? 10/05/21 0530 10/05/21 0540  ?BP: 100/83 94/69  ?Pulse: 94 85  ?Resp: 18 18  ?Temp:  36.6 ?C  ?SpO2: 100% 100%  ?  ?Last Pain:  ?Vitals:  ? 10/05/21 0540  ?TempSrc: Oral  ?PainSc: 4   ? ?Pain Goal:   ? ?  ?  ?  ?  ?  ?  ?Epidural/Spinal Function Cutaneous sensation: Able to Wiggle Toes (10/05/21 0540), Patient able to flex knees: Yes (10/05/21 0540), Patient able to lift hips off bed: Yes (10/05/21 0540), Back pain beyond tenderness at insertion site: No (10/05/21 0540), Progressively worsening motor and/or sensory loss: No (10/05/21 0540), Bowel and/or bladder incontinence post epidural: No (10/05/21 0540) ? ?Lowella Curb ? ? ? ? ?

## 2021-10-05 NOTE — Addendum Note (Signed)
Addendum  created 10/05/21 [id number] by Genevie Ann, CRNA  ? Intraprocedure Meds edited  ?  ?

## 2021-10-05 NOTE — Progress Notes (Signed)
Late entry: ? ?At bedside due to fetal heart rate tracing.  FHT noted to be in the 60bpm.  Pt has already required Terb x 2 due to Cat. 2 tracing with variable decels.  SVE performed by RN, pt still 7cm.  Pt repositioned, FHT 60-80bpm. Verbal consent obtained for stat C-section.  Risk/benefit reviewed with patient including risk of bleeding, infection and injury to surrounding areas. Questions and concerns were addressed. OR notified. ? ?Myna Hidalgo, DO ?Attending Obstetrician & Gynecologist, Faculty Practice ?Center for [employer], [facility] ? ? ?

## 2021-10-05 NOTE — Op Note (Signed)
PreOp Diagnosis: 1) Intrauterine pregnancy @ [redacted]w[redacted]d ?2) Non-reassuring fetal heart tones ?PostOp Diagnosis: same ?Procedure: Primary C-section ?Surgeon: Dr. Myna Hidalgo ?Anesthesia: epidural ?Complications: none ?EBL: 1000cc ?UOP: 300cc ?Fluids: 1300cc ? ? ?Findings: Female infant, OP presentation with nuchal.  Normal uterus, tubes and ovaries bilaterally. ? ?PROCEDURE:  ?Informed consent was obtained from the patient with risks, benefits, complications, treatment options, and expected outcomes discussed with the patient.  The patient concurred with the proposed plan, giving informed consent with form signed.  ? ?The patient was taken to Operating Room, and identified with the procedure verified as C-Section Delivery with Time Out. The patient was prepped and draped in the usual sterile fashion. A Pfannenstiel incision was made and carried down through the subcutaneous tissue to the fascia. The fascia was incised in the midline and extended transversely. The peritoneum was identified and entered.  Alexis retractor was placed.   A low transverse uterine incision was made and the infants head delivered atraumatically. After the umbilical cord was clamped and cut cord blood was obtained for evaluation. ? ? The placenta was removed intact and appeared normal. The uterine outline, tubes and ovaries appeared normal. The uterine incision was closed with running locked sutures of 0 Vicryl and a second layer of the same stitch was used in an imbricating fashion.  Arista was placed.  Excellent hemostasis was obtained. Alexis retractor was removed.  Peritoneum was closed in a running fashion using 2-0 vicryl. The fascia was then reapproximated with running sutures of 0 Vicryl. The skin was closed with 4-0 vicryl in a subcuticular fashion. ? ?Instrument, sponge, and needle counts were correct prior the abdominal closure and at the conclusion of the case. The patient was taken to recovery in stable condition.  ? ?Myna Hidalgo,  DO ?Attending Obstetrician & Gynecologist, Faculty Practice ?Center for [employer], [facility] ? ? ?

## 2021-10-05 NOTE — Discharge Summary (Shared)
Postpartum Discharge Summary  Date of Service updated***     Patient Name: Judy Ballard DOB: 2001/06/08 MRN: [id number]  Date of admission: 10/04/2021 Delivery date:10/05/2021  Delivering provider: Janyth Pupa  Date of discharge: 10/05/2021  Admitting diagnosis: Decreased fetal movement [O36.8190] Intrauterine pregnancy: [redacted]w[redacted]d    Secondary diagnosis:  Principal Problem:   Decreased fetal movement Active Problems:   Late prenatal care   History of gestational diabetes in prior pregnancy, currently pregnant in third trimester  Additional problems: none    Discharge diagnosis: Term Pregnancy Delivered                                              Post partum procedures:{Postpartum procedures:23558} Augmentation: Pitocin Complications: None  Hospital course: Induction of Labor With Cesarean Section   21y.o. yo G2P2002 at 366w6das admitted to the hospital 10/04/2021 for induction of labor. Initially, she was started on Pitocin; however, due to Cat. 2 tracing Pit was stopped and internals were placed.  She continued to have regular contractions on her own.  She progressed to 7cm; however, she was noted to have a prolonged deceleration for over 65m8mwith FHT in the 60s.  She went for a stat C-section due to non-reassuring fetal well being.  The patient went for cesarean section due to Non-Reassuring FHR. Delivery details are as follows: Membrane Rupture Time/Date: 9:58 PM ,10/04/2021   Delivery Method:C-Section, Low Transverse  Details of operation can be found in separate operative Note.  Patient had an uncomplicated postpartum course. *** She is ambulating, tolerating a regular diet, passing flatus, and urinating well.  Patient is discharged home in stable condition on 10/05/21.      Newborn Data: Birth date:10/05/2021  Birth time:3:24 AM  Gender:Female  Living status:Living  Apgars:8 ,9  Weight:3524 g                                Magnesium Sulfate received: No BMZ  received: No Rhophylac:{Rhophylac received:30440032} MMR:{MMR:30440033} T-DaP:{Tdap:23962} Flu: {Fl{PET:62446}ansfusion:{Transfusion received:30440034}  Physical exam  Vitals:   10/05/21 0500 10/05/21 0515 10/05/21 0530 10/05/21 0540  BP: (!) 103/59 (!) 99/58 100/83 94/69  Pulse: 84 81 94 85  Resp: 20 20 18 18   Temp: 97.6 F (36.4 C)   97.9 F (36.6 C)  TempSrc: Oral   Oral  SpO2: 100% 100% 100% 100%  Weight:      Height:       General: {Exam; general:21111117} Lochia: {Desc; appropriate/inappropriate:30686::"appropriate"} Uterine Fundus: {Desc; firm/soft:30687} Incision: {Exam; incision:21111123} DVT Evaluation: {Exam; dvt:2111122} Labs: Lab Results  Component Value Date   WBC 6.1 10/04/2021   HGB 10.0 (L) 10/04/2021   HCT 31.8 (L) 10/04/2021   MCV 83.7 10/04/2021   PLT 204 10/04/2021   CMP Latest Ref Rng & Units 10/01/2021  Glucose 70 - 99 mg/dL 97  BUN 6 - 20 mg/dL <5(L)  Creatinine 0.44 - 1.00 mg/dL 0.57  Sodium 135 - 145 mmol/L 138  Potassium 3.5 - 5.1 mmol/L 3.3(L)  Chloride 98 - 111 mmol/L 108  CO2 22 - 32 mmol/L 20(L)  Calcium 8.9 - 10.3 mg/dL 9.0  Total Protein 6.5 - 8.1 g/dL 6.1(L)  Total Bilirubin 0.3 - 1.2 mg/dL 0.6  Alkaline Phos 38 - 126 U/L 165(H)  AST 15 - 41  U/L 18  ALT 0 - 44 U/L 8   Edinburgh Score: Edinburgh Postnatal Depression Scale Screening Tool 10/05/2021  I have been able to laugh and see the funny side of things. (No Data)     After visit meds:  Allergies as of 10/05/2021       Reactions   Apple Juice Hives   Swollen lips   Fish Allergy Hives   tilapia     Med Rec must be completed prior to using this [place]***        Discharge home in stable condition Infant Feeding: {Baby feeding:23562} Infant Disposition:{CHL IP OB HOME WITH UVOZDG:64403} Discharge instruction: per After Visit Summary and Postpartum booklet. Activity: Advance as tolerated. Pelvic rest for 6 weeks.  Diet: {OB KVQQ:59563875} Future  Appointments:No future appointments. Follow up Visit:   Please schedule this patient for a {Visit type:23955} postpartum visit in {Postpartum visit:23953} with the following provider: {Provider type:23954}. Additional Postpartum F/U:{PP Procedure:23957}  {Risk IEPPI:95188} pregnancy complicated by: {CZYSAYTKZSWF:09323} Delivery mode:  C-Section, Low Transverse  Anticipated Birth Control:  {Birth Control:23956}   10/05/2021 Judy Genta, DO

## 2021-10-06 ENCOUNTER — Other Ambulatory Visit: Payer: Self-pay

## 2021-10-06 MED ORDER — SODIUM CHLORIDE 0.9 % IV SOLN
500.0000 mg | Freq: Once | INTRAVENOUS | Status: AC
  Administered 2021-10-06: 500 mg via INTRAVENOUS
  Filled 2021-10-06: qty 25

## 2021-10-06 NOTE — Progress Notes (Signed)
POSTPARTUM PROGRESS NOTE ? ?POD #1 ? ?Subjective: ? ?Judy Ballard is a 21 y.o. [id number] s/p PLTCS at [redacted]w[redacted]d. No acute events overnight. She reports she is doing well but is experiencing some abdominal pain since around 3 am. She states it is a sharp and aching pain in her lower abdomen that is worsened with movement. She denies any problems with ambulating, voiding or po intake. Denies nausea or vomiting. She has passed flatus. Pain is moderately controlled.  Lochia is appropriate.  ? ?Objective: ?Blood pressure 108/72, pulse 92, temperature 98.4 ?F (36.9 ?C), temperature source Oral, resp. rate 16, height 5\' 3"  (1.6 m), weight 89.1 kg, last menstrual period 12/05/2020, SpO2 100 %, unknown if currently breastfeeding. ? ?Physical Exam:  ?General: alert, cooperative and no distress ?Chest: no respiratory distress ?Heart: regular rate, distal pulses intact ?Uterine Fundus: firm, appropriately tender ?DVT Evaluation: No calf swelling or tenderness ?Extremities: No edema ?Skin: warm, dry; incision clean/dry/intact w/ honeycomb dressing in place ? ?Recent Labs  ?  10/04/21 ?1655 10/05/21 ?0639  ?HGB 10.0* 9.0*  ?HCT 31.8* 27.1*  ? ? ?Assessment/Plan: ?Judy Ballard is a 21 y.o. 36 s/p PLTCS at [redacted]w[redacted]d. ? ?POD#1 - Doing welll; pain moderately controlled. H/H decreased from 10 to 9. Discussed IV iron and patient is agreeable. ?Routine postpartum care ?OOB, ambulated ?Lovenox for VTE prophylaxis ?Anemia: asymptomatic ?Start IV ferrous sulfate  ?Contraception: discussed progestin-only OCP but patient is unsure if she would like to begin one now. ?Feeding: bottle feeding going well. ? ?Dispo: Plan for discharge Wednesday. ? ? LOS: 2 days  ? ? ?10/06/2021, 7:19 AM  ?

## 2021-10-07 DIAGNOSIS — Z98891 History of uterine scar from previous surgery: Secondary | ICD-10-CM

## 2021-10-07 LAB — SURGICAL PATHOLOGY

## 2021-10-07 MED ORDER — ACETAMINOPHEN 500 MG PO TABS: 1000.0000 mg | ORAL_TABLET | ORAL | 0 refills | Status: DC | PRN

## 2021-10-07 MED ORDER — IBUPROFEN 600 MG PO TABS: 600.0000 mg | ORAL_TABLET | Freq: Four times a day (QID) | ORAL | 0 refills | Status: DC

## 2021-10-07 MED ORDER — ESCITALOPRAM OXALATE 10 MG PO TABS: 10.0000 mg | ORAL_TABLET | Freq: Every day | ORAL | 2 refills | Status: DC

## 2021-10-07 NOTE — Social Work (Signed)
CSW received consult for hx of Depression.  CSW met with MOB to offer support and complete assessment.   ? ?CSW met with MOB at bedside and introduced CSW role. CSW observed MOB sitting on the couch at bedside and next to her maternal grandmother was holding the infant. MOB presented calm and welcomed CSW visit with her mom present. MOB identified her mom as her support. CSW inquired how MOB has felt since giving birth. MOB reported feeling good. MOB described the L&D as "traumatizing", she stated that she had an emergency c-section since the umbilical cord was wrapped around the infant's neck. CSW validated MOB feelings. CSW inquired how MOB felt emotionally during the pregnancy. MOB reported that she felt okay. CSW asked about MOB mental health. MOB acknowledged that she has a history of anxiety and depression. MOB reported prior to the pregnancy was taking Lexapro for symptoms but stopped taking the medication during the pregnancy. MOB reported she would like to resume the medication at discharge. MOB reported she is not sure if she still has an active prescription and requested assistance. CSW offered to follow up with nurse to assist. MOB was appreciative. MOB reported no history of therapy but stated she is open to trying therapy soon. CSW discussed PPD. 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 MOB reported she took Lexapro still she felt like her "hormones were all over the place? possibly because of the birth control she was taking as well. CSW provided education regarding the baby blues period vs. perinatal mood disorders, discussed treatment and gave resources for mental health follow up. CSW recommended MOB complete a self-evaluation during the postpartum time period using the New Mom Checklist from Postpartum Progress and encouraged MOB to contact a medical professional if symptoms are noted at any time. MOB reported  she feels comfortable reaching out to her provider if she has concerns. CSW assessed MOB for safety. MOB denied thoughts of harm to self and others.  ? ?MOB reported she has items for the infant including a bassinet where the infant will sleep. CSW provided review of Sudden Infant Death Syndrome (SIDS) precautions. CSW assessed MOB for additional need. MOB reported concern with WIC "having breastfeed over Formula feed" as MOB plans to formula feed the infant. CSW assisted MOB in calling WIC to make this change. MOB reported no additional needs.  ? ?CSW made the nurse aware of MOB request for a prescription for Lexapro prior to discharge.  ? ?CSW identifies no further need for intervention and no barriers to discharge at this time. ? ?Cyanna Neace, MSW, LCSW ?[facility]  ?Clinical Social Worker  ?[phone] ?10/07/2021  11:35 AM  ?

## 2021-10-15 ENCOUNTER — Telehealth (HOSPITAL_COMMUNITY): Payer: Self-pay | Admitting: *Deleted

## 2021-10-15 ENCOUNTER — Ambulatory Visit: Payer: Medicaid Other | Admitting: Urology

## 2021-10-15 NOTE — Progress Notes (Deleted)
? ?  Assessment: ?No diagnosis found. ? ? ?Plan: ?*** ? ?Chief Complaint: No chief complaint on file. ? ? ?History of Present Illness: ? ?Judy Ballard is a 21 y.o. year old female who is seen in consultation from Gavin Pound, CNM for evaluation of right hydronephrosis. ? ? ? ?Past Medical History:  ?Past Medical History:  ?Diagnosis Date  ? Right knee injury, initial encounter 08/10/2017  ? S/P tonsillectomy 07/16/2019  ? Tonsillitis   ? UTI (urinary tract infection)   ? ? ?Past Surgical History:  ?Past Surgical History:  ?Procedure Laterality Date  ? CESAREAN SECTION N/A 10/05/2021  ? Procedure: CESAREAN SECTION;  Surgeon: Janyth Pupa, DO;  Location: [facility];  Service: Obstetrics;  Laterality: N/A;  Stat C/S for non reassuring fetal heart tracing  ? TONSILLECTOMY Bilateral 07/16/2019  ? Procedure: TONSILLECTOMY;  Surgeon: Izora Gala, MD;  Location: [place];  Service: ENT;  Laterality: Bilateral;  ? UMBILICAL HERNIA REPAIR    ? ? ?Allergies:  ?Allergies  ?Allergen Reactions  ? Apple Juice Hives  ?  Swollen lips  ? Fish Allergy Hives  ?  tilapia ?  ? ? ?Family History:  ?No family history on file. ? ?Social History:  ?Social History  ? ?Tobacco Use  ? Smoking status: Never  ? Smokeless tobacco: Never  ?Vaping Use  ? Vaping Use: Never used  ?Substance Use Topics  ? Alcohol use: No  ? Drug use: Not Currently  ? ? ?Review of symptoms:  ?Constitutional:  Negative for unexplained weight loss, night sweats, fever, chills ?ENT:  Negative for nose bleeds, sinus pain, painful swallowing ?CV:  Negative for chest pain, shortness of breath, exercise intolerance, palpitations, loss of consciousness ?Resp:  Negative for cough, wheezing, shortness of breath ?GI:  Negative for nausea, vomiting, diarrhea, bloody stools ?GU:  Positives noted in HPI; otherwise negative for gross hematuria, dysuria, urinary incontinence ?Neuro:  Negative for seizures, poor balance, limb weakness, slurred speech ?Psych:   Negative for lack of energy, depression, anxiety ?Endocrine:  Negative for polydipsia, polyuria, symptoms of hypoglycemia (dizziness, hunger, sweating) ?Hematologic:  Negative for anemia, purpura, petechia, prolonged or excessive bleeding, use of anticoagulants  ?Allergic:  Negative for difficulty breathing or choking as a result of exposure to anything; no shellfish allergy; no allergic response (rash/itch) to materials, foods ? ?Physical exam: ?LMP 12/05/2020  ?GENERAL APPEARANCE:  Well appearing, well developed, well nourished, NAD ?HEENT: Atraumatic, Normocephalic, oropharynx clear. ?NECK: Supple without lymphadenopathy or thyromegaly. ?LUNGS: Clear to auscultation bilaterally. ?HEART: Regular Rate and Rhythm without murmurs, gallops, or rubs. ?ABDOMEN: Soft, non-tender, No Masses. ?EXTREMITIES: Moves all extremities well.  Without clubbing, cyanosis, or edema. ?NEUROLOGIC:  Alert and oriented x 3, normal gait, CN II-XII grossly intact.  ?MENTAL STATUS:  Appropriate. ?BACK:  Non-tender to palpation.  No CVAT ?SKIN:  Warm, dry and intact.   ? ?Results: ?No results found for this or any previous visit (from the past 24 hour(s)). ? ?

## 2021-10-15 NOTE — Telephone Encounter (Signed)
Left phone voicemail message. ? ?Duffy Rhody, RN 10-15-2021 at 11:47am ?

## 2021-10-16 IMAGING — CR DG HAND COMPLETE 3+V*L*
3 series · 3 of 3 positions shown · non-contrast
Comparison: None.

CLINICAL DATA: Pain.

EXAM:
LEFT HAND - COMPLETE 3+ VIEW

[x hand pa left]
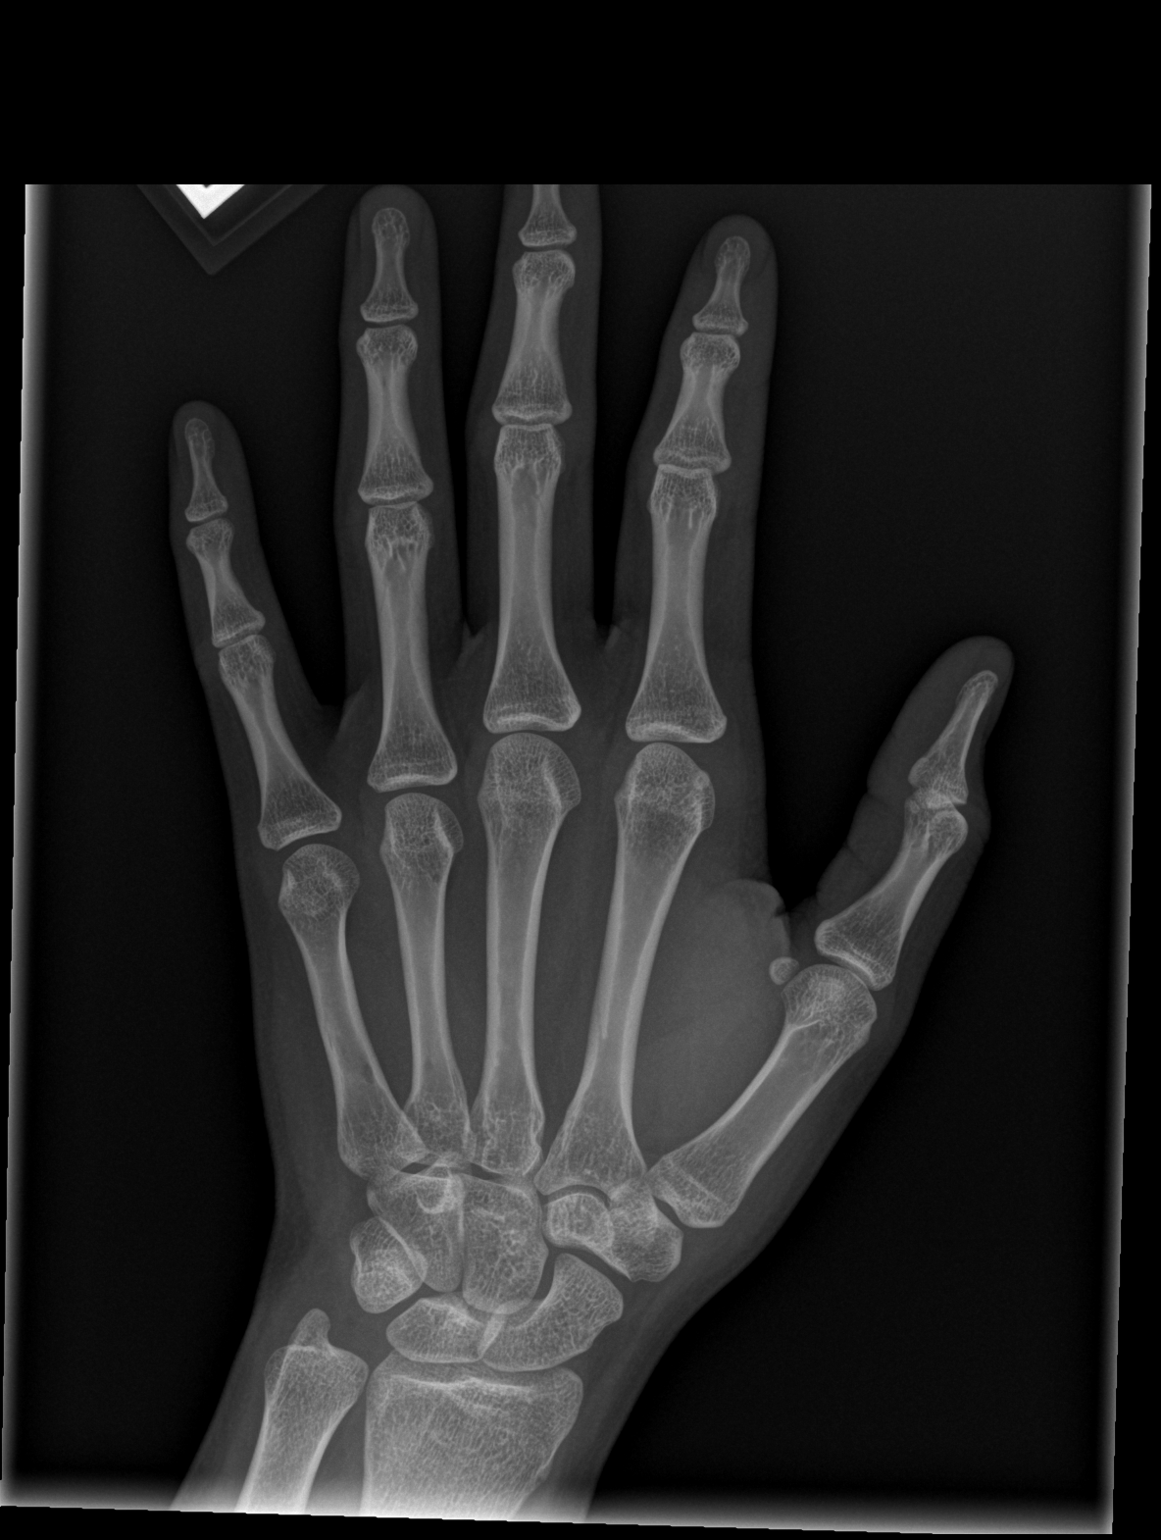

[x hand obl left]
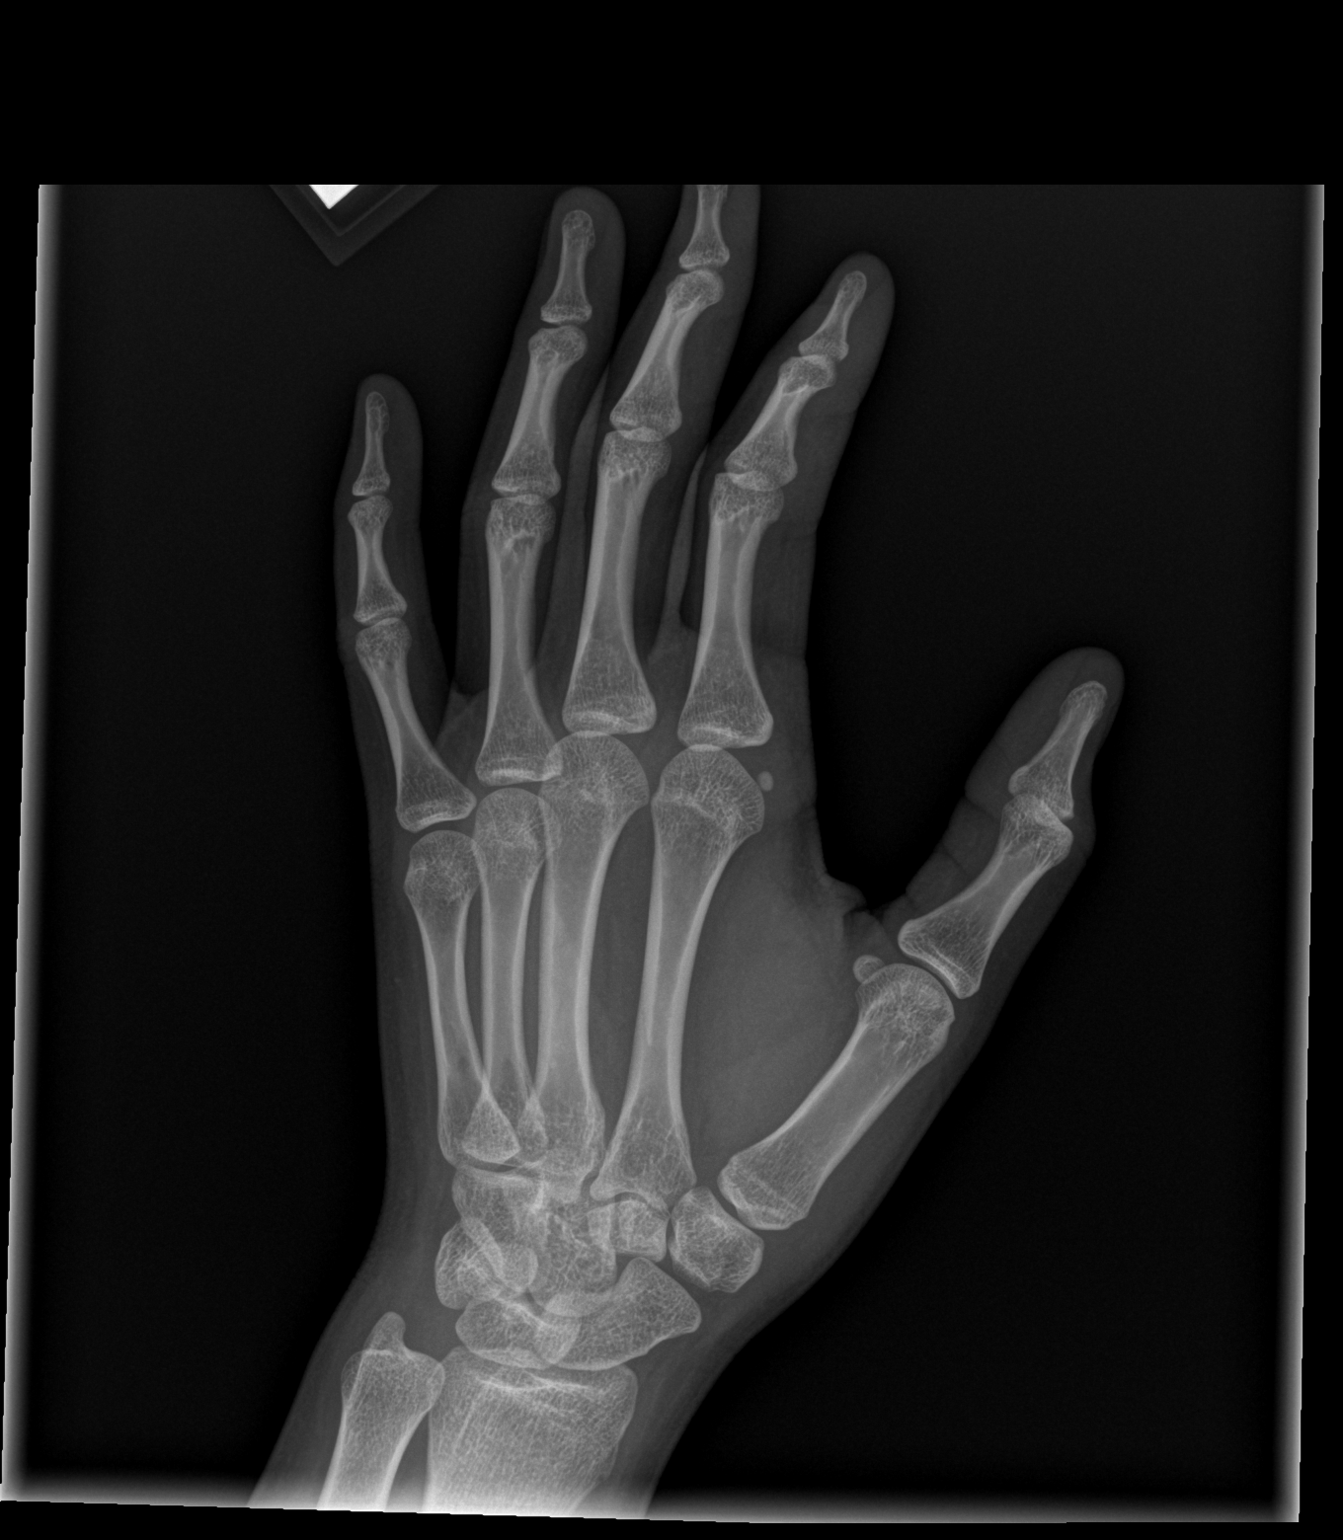

[x hand lat left]
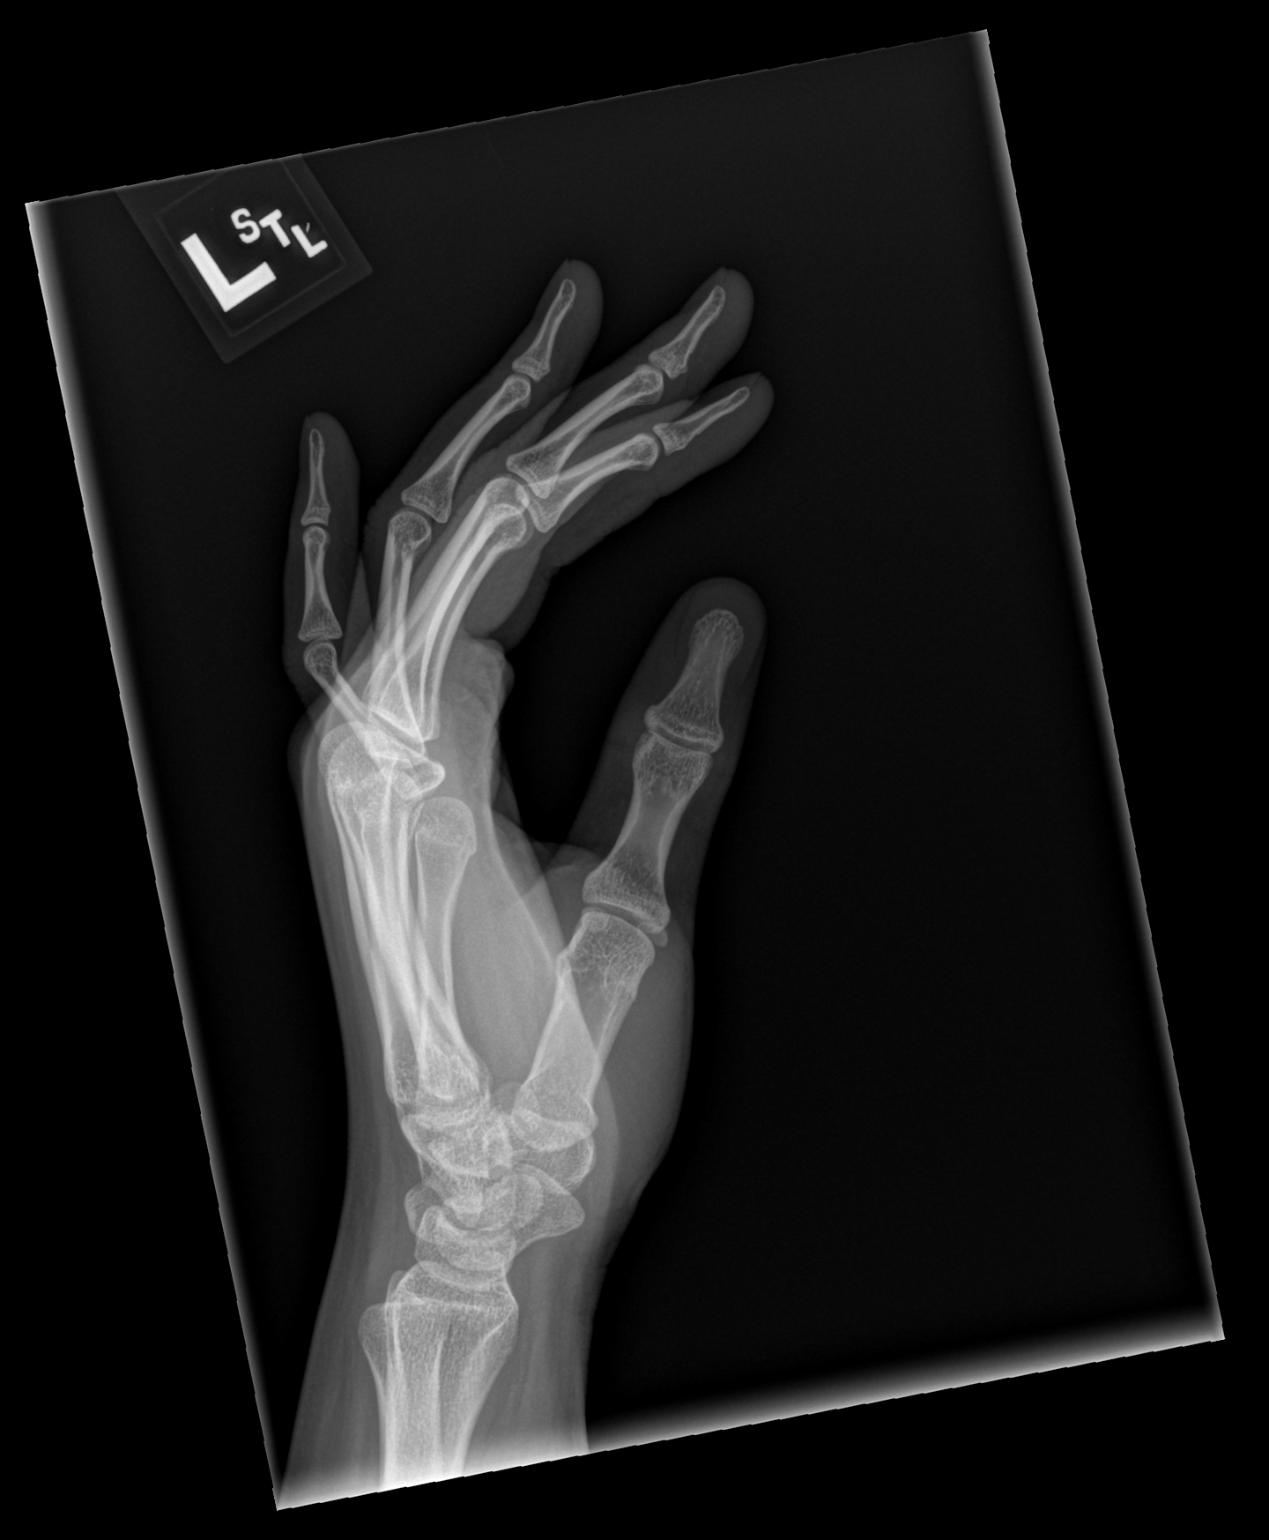

[3 of 3 positions shown; findings below may reference images not displayed]

FINDINGS: There is no evidence of fracture or dislocation. There is no
evidence of arthropathy or other focal bone abnormality. Soft
tissues are unremarkable.
IMPRESSION: Negative.

## 2021-10-28 ENCOUNTER — Encounter (HOSPITAL_COMMUNITY): Payer: Self-pay | Admitting: Emergency Medicine

## 2021-10-28 ENCOUNTER — Ambulatory Visit (HOSPITAL_COMMUNITY)
Admission: EM | Admit: 2021-10-28 | Discharge: 2021-10-28 | Disposition: A | Discharge status: Home / Self Care | Payer: Medicaid Other | Attending: Family Medicine | Admitting: Family Medicine

## 2021-10-28 DIAGNOSIS — M654 Radial styloid tenosynovitis [de Quervain]: Secondary | ICD-10-CM | POA: Diagnosis not present

## 2021-10-28 MED ORDER — NAPROXEN 500 MG PO TABS: 500.0000 mg | ORAL_TABLET | Freq: Two times a day (BID) | ORAL | 0 refills | Status: DC

## 2021-10-28 NOTE — ED Triage Notes (Signed)
Pt co left wrist and thumb pain for 3 days. Deneis any injury.  ?

## 2021-10-28 NOTE — Discharge Instructions (Signed)
Wear your thumb brace for the next 1-2 weeks. Follow up with sports medicine if not improving. ?

## 2021-11-10 NOTE — ED Provider Notes (Signed)
?[facility] ? ? ?[id number] ?10/28/21 Arrival Time: 1545 ? ?ASSESSMENT & PLAN: ? ?1. De Quervain's tenosynovitis, left   ? ?No indication for plain imaging at this time. Discussed. ? ?Discharge Medication List as of 10/28/2021  5:53 PM  ?  ? ?START taking these medications  ? Details  ?naproxen (NAPROSYN) 500 MG tablet Take 1 tablet (500 mg total) by mouth 2 (two) times daily with a meal., Starting Wed 10/28/2021, Normal  ?  ?  ? ? ?Orders Placed This Encounter  ?Procedures  ? Apply Thumb spica  ? ? ?Recommend: ? Follow-up Information   ? ? [facility].   ?Why: If worsening or failing to improve as anticipated. ?Contact information: ?[address] ?[place] [address] ?[phone] ? ?  ?  ? ?  ?  ? ?  ? ?Reviewed expectations re: course of current medical issues. Questions answered. ?Outlined signs and symptoms indicating need for more acute intervention. ?Patient verbalized understanding. ?After Visit Summary given. ? ?SUBJECTIVE: ?History from: patient. ?Judy Ballard is a 21 y.o. female who reports LEFT wrist/thumb pain; x sev days. No trauma. Does perform repetitive motions. Occas finger tingling. No h/o similar. ? ?Past Surgical History:  ?Procedure Laterality Date  ? CESAREAN SECTION N/A 10/05/2021  ? Procedure: CESAREAN SECTION;  Surgeon: Myna Hidalgo, DO;  Location: [facility];  Service: Obstetrics;  Laterality: N/A;  Stat C/S for non reassuring fetal heart tracing  ? TONSILLECTOMY Bilateral 07/16/2019  ? Procedure: TONSILLECTOMY;  Surgeon: Serena Colonel, MD;  Location: [facility];  Service: ENT;  Laterality: Bilateral;  ? UMBILICAL HERNIA REPAIR    ?  ? ? ?OBJECTIVE: ? ?Vitals:  ? 10/28/21 1656  ?BP: 134/78  ?Pulse: 63  ?Resp: 16  ?Temp: 98.9 ?F (37.2 ?C)  ?TempSrc: Oral  ?SpO2: 98%  ?  ?General appearance: alert; no distress ?HEENT: [place]; AT ?Neck: supple with FROM ?Resp: unlabored respirations ?Extremities: ?LUE: warm with well  perfused appearance; pain reported over radial side of wrist, more notable with thumb and wrist movement; no erythema or inflammation; no swelling; FROM; very tender over radial styloid ?CV: brisk extremity capillary refill of LUE; 2+ radial pulse of LUE. ?Skin: warm and dry; no visible rashes ?Neurologic: gait normal; normal sensation and strength of LUE ?Psychological: alert and cooperative; normal mood and affect ? ? ? ? ?Allergies  ?Allergen Reactions  ? Apple Juice Hives  ?  Swollen lips  ? Fish Allergy Hives  ?  tilapia ?  ? ? ?Past Medical History:  ?Diagnosis Date  ? Right knee injury, initial encounter 08/10/2017  ? S/P tonsillectomy 07/16/2019  ? Tonsillitis   ? UTI (urinary tract infection)   ? ?Social History  ? ?Socioeconomic History  ? Marital status: Single  ?  Spouse name: Not on file  ? Number of children: Not on file  ? Years of education: Not on file  ? Highest education level: Not on file  ?Occupational History  ? Not on file  ?Tobacco Use  ? Smoking status: Never  ? Smokeless tobacco: Never  ?Vaping Use  ? Vaping Use: Never used  ?Substance and Sexual Activity  ? Alcohol use: No  ? Drug use: Not Currently  ? Sexual activity: Not Currently  ?Other Topics Concern  ? Not on file  ?Social History Narrative  ? Not on file  ? ?Social Determinants of Health  ? ?Financial Resource Strain: Not on file  ?Food  Insecurity: Not on file  ?Transportation Needs: Not on file  ?Physical Activity: Not on file  ?Stress: Not on file  ?Social Connections: Not on file  ? ?No family history on file. ?Past Surgical History:  ?Procedure Laterality Date  ? CESAREAN SECTION N/A 10/05/2021  ? Procedure: CESAREAN SECTION;  Surgeon: Myna Hidalgo, DO;  Location: [facility];  Service: Obstetrics;  Laterality: N/A;  Stat C/S for non reassuring fetal heart tracing  ? TONSILLECTOMY Bilateral 07/16/2019  ? Procedure: TONSILLECTOMY;  Surgeon: Serena Colonel, MD;  Location: [facility];  Service: ENT;  Laterality:  Bilateral;  ? UMBILICAL HERNIA REPAIR    ? ? ? ?  ?Mardella Layman, MD ?11/10/21 [phone] ? ?

## 2021-12-14 DIAGNOSIS — E669 Obesity, unspecified: Secondary | ICD-10-CM | POA: Diagnosis not present

## 2021-12-14 DIAGNOSIS — N3000 Acute cystitis without hematuria: Secondary | ICD-10-CM | POA: Diagnosis not present

## 2021-12-14 DIAGNOSIS — Z131 Encounter for screening for diabetes mellitus: Secondary | ICD-10-CM | POA: Diagnosis not present

## 2021-12-14 DIAGNOSIS — Z0000 Encounter for general adult medical examination without abnormal findings: Secondary | ICD-10-CM | POA: Diagnosis not present

## 2021-12-14 DIAGNOSIS — M2559 Pain in other specified joint: Secondary | ICD-10-CM | POA: Diagnosis not present

## 2021-12-14 DIAGNOSIS — Z13220 Encounter for screening for lipoid disorders: Secondary | ICD-10-CM | POA: Diagnosis not present

## 2022-01-02 DIAGNOSIS — H5213 Myopia, bilateral: Secondary | ICD-10-CM | POA: Diagnosis not present

## 2022-02-22 IMAGING — CR DG CHEST 2V
2 series · 2 of 2 positions shown · non-contrast
Comparison: 04/18/2020

CLINICAL DATA: Nausea, vomiting, and weakness for 1 week.
Twenty-one weeks pregnant.

EXAM:
CHEST - 2 VIEW

[w chest pa]
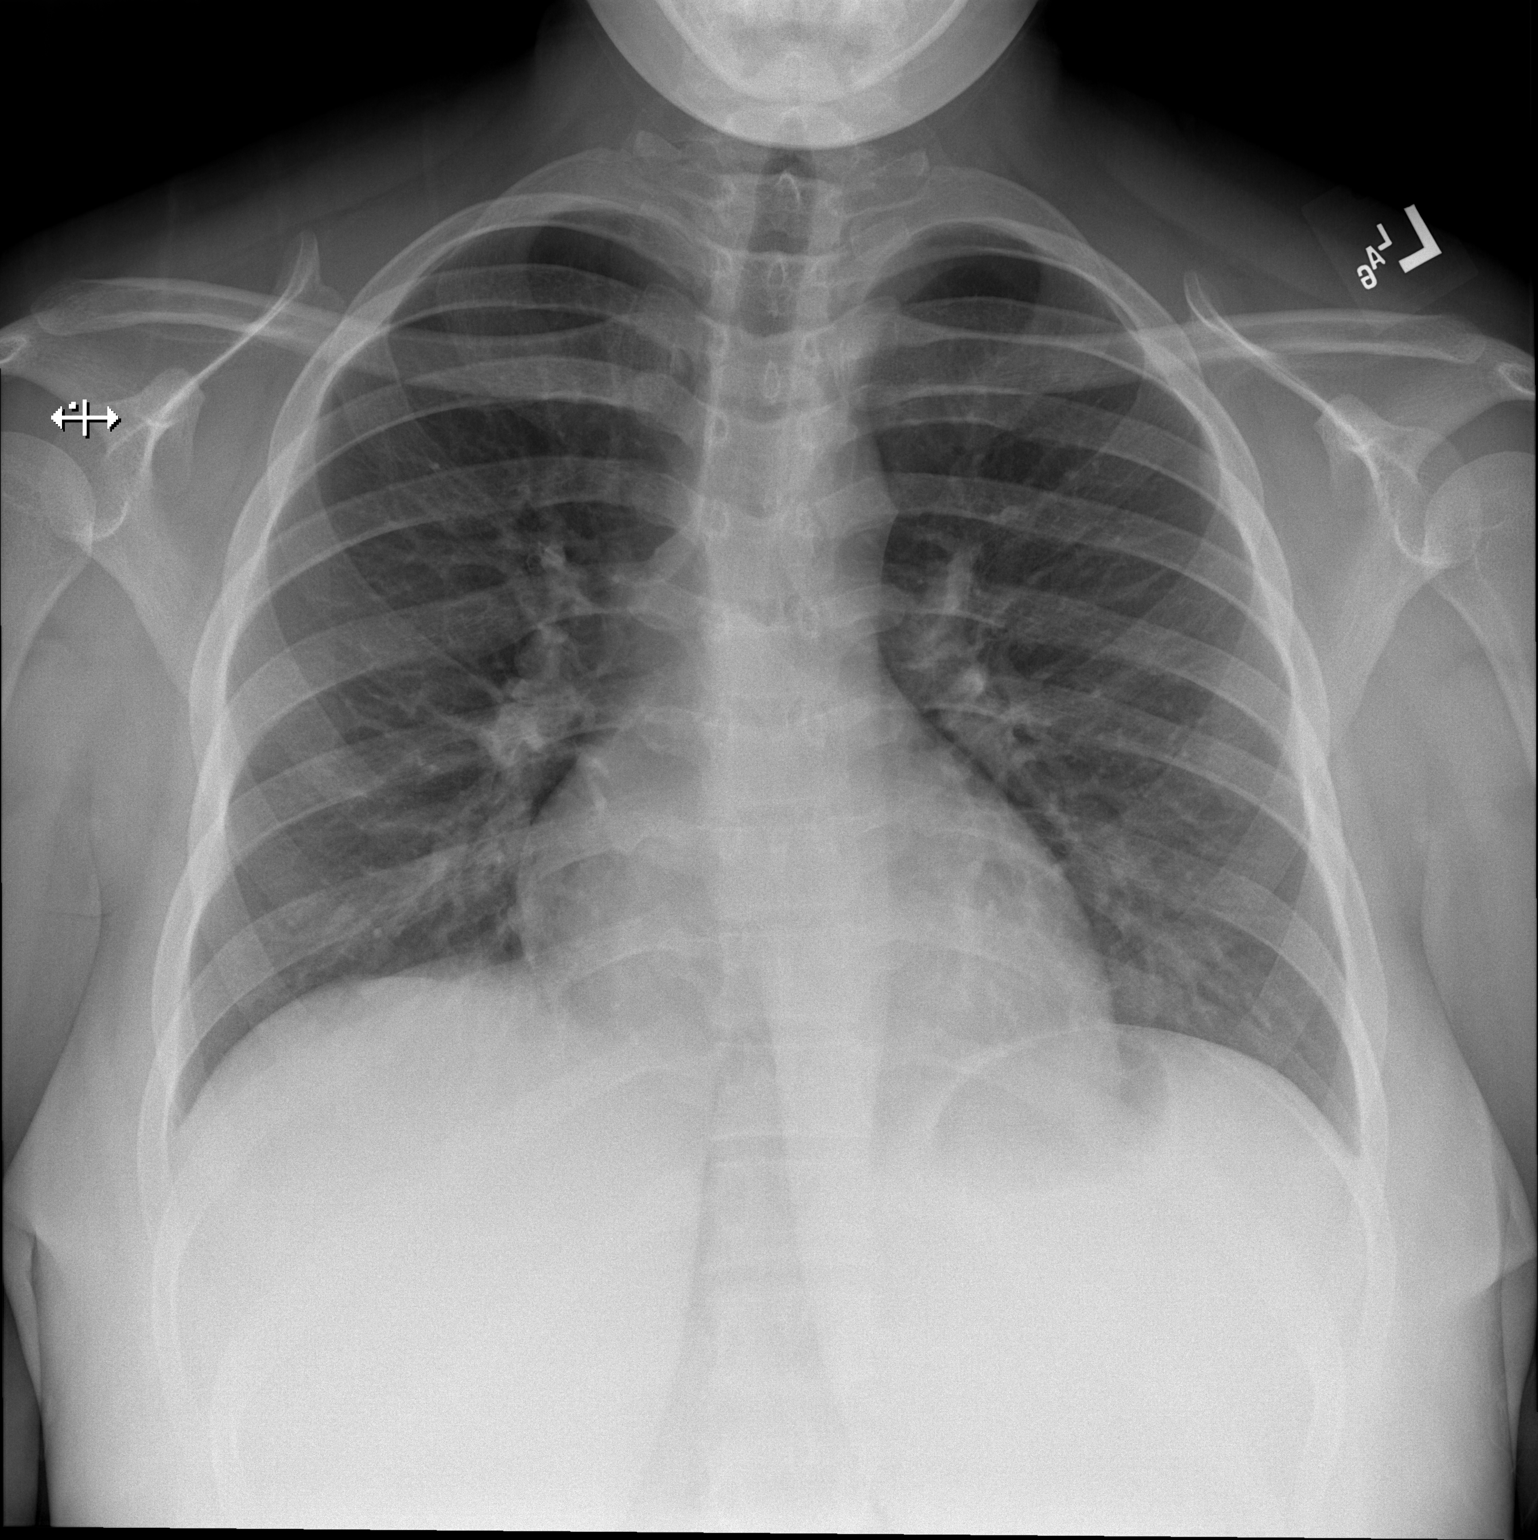

[w chest lat]
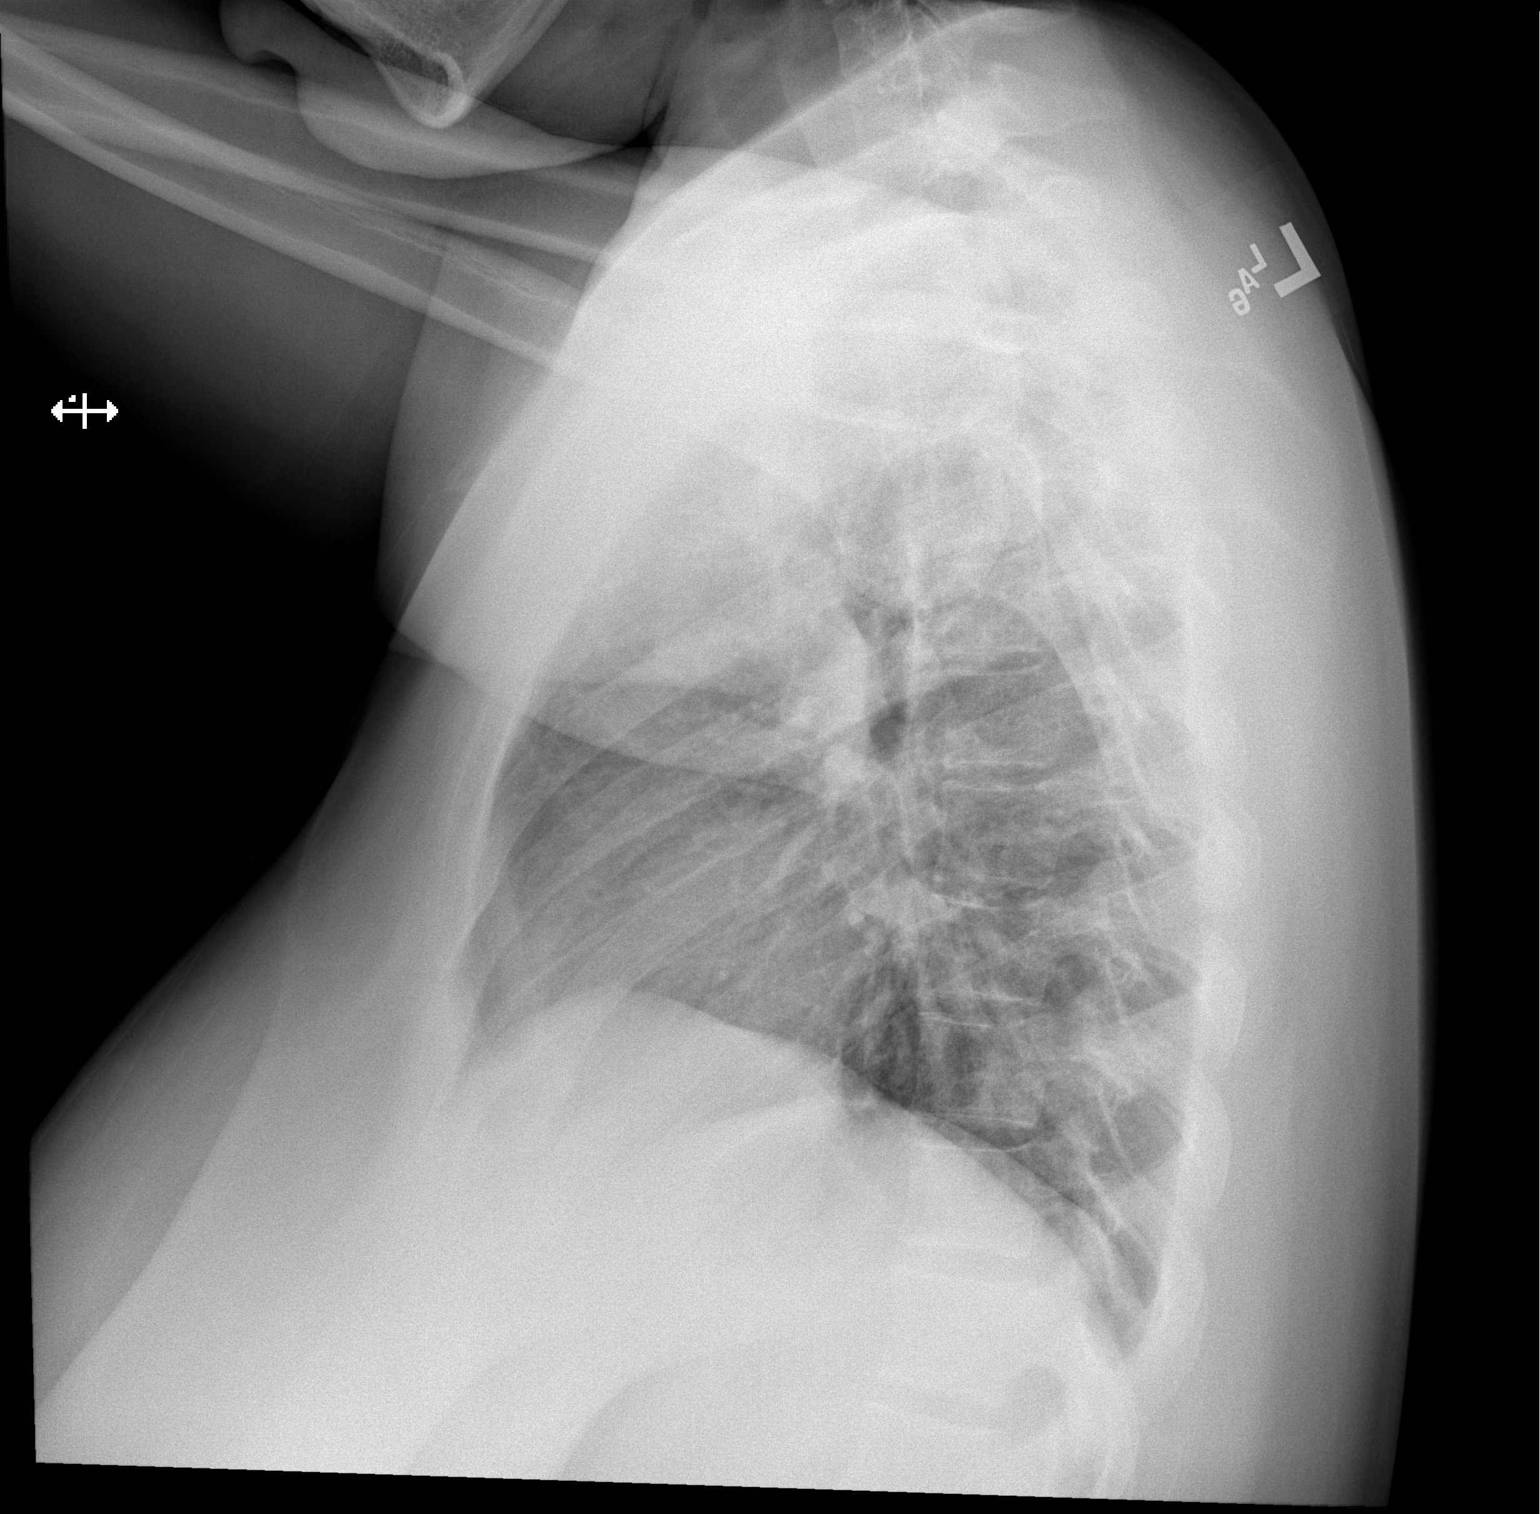

[2 of 2 positions shown; findings below may reference images not displayed]

FINDINGS: The heart size and mediastinal contours are within normal limits.
Both lungs are clear. The visualized skeletal structures are
unremarkable.
IMPRESSION: No active cardiopulmonary disease.

## 2022-03-13 ENCOUNTER — Emergency Department (HOSPITAL_COMMUNITY): Payer: Medicaid Other

## 2022-03-13 ENCOUNTER — Emergency Department (HOSPITAL_COMMUNITY)
Admission: EM | Admit: 2022-03-13 | Discharge: 2022-03-13 | Disposition: A | Discharge status: Home / Self Care | Payer: Medicaid Other | Attending: Emergency Medicine | Admitting: Emergency Medicine

## 2022-03-13 ENCOUNTER — Other Ambulatory Visit: Payer: Self-pay

## 2022-03-13 DIAGNOSIS — R1011 Right upper quadrant pain: Secondary | ICD-10-CM | POA: Diagnosis not present

## 2022-03-13 DIAGNOSIS — R9431 Abnormal electrocardiogram [ECG] [EKG]: Secondary | ICD-10-CM | POA: Diagnosis not present

## 2022-03-13 DIAGNOSIS — R112 Nausea with vomiting, unspecified: Secondary | ICD-10-CM | POA: Diagnosis not present

## 2022-03-13 DIAGNOSIS — N9489 Other specified conditions associated with female genital organs and menstrual cycle: Secondary | ICD-10-CM | POA: Diagnosis not present

## 2022-03-13 DIAGNOSIS — R109 Unspecified abdominal pain: Secondary | ICD-10-CM | POA: Diagnosis not present

## 2022-03-13 DIAGNOSIS — R1110 Vomiting, unspecified: Secondary | ICD-10-CM | POA: Diagnosis not present

## 2022-03-13 DIAGNOSIS — K760 Fatty (change of) liver, not elsewhere classified: Secondary | ICD-10-CM | POA: Diagnosis not present

## 2022-03-13 DIAGNOSIS — R1013 Epigastric pain: Secondary | ICD-10-CM | POA: Diagnosis not present

## 2022-03-13 DIAGNOSIS — Z79899 Other long term (current) drug therapy: Secondary | ICD-10-CM | POA: Insufficient documentation

## 2022-03-13 LAB — RAPID URINE DRUG SCREEN, HOSP PERFORMED
Amphetamines: NOT DETECTED
Barbiturates: NOT DETECTED
Benzodiazepines: NOT DETECTED
Cocaine: NOT DETECTED
Opiates: NOT DETECTED
Tetrahydrocannabinol: POSITIVE — AB

## 2022-03-13 LAB — COMPREHENSIVE METABOLIC PANEL
ALT: 29 U/L (ref 0–44)
AST: 56 U/L — ABNORMAL HIGH (ref 15–41)
Albumin: 4 g/dL (ref 3.5–5.0)
Alkaline Phosphatase: 84 U/L (ref 38–126)
Anion gap: 9 (ref 5–15)
BUN: 13 mg/dL (ref 6–20)
CO2: 23 mmol/L (ref 22–32)
Calcium: 9.5 mg/dL (ref 8.9–10.3)
Chloride: 107 mmol/L (ref 98–111)
Creatinine, Ser: 0.78 mg/dL (ref 0.44–1.00)
GFR, Estimated: >60 mL/min (ref >60)
Glucose, Bld: 187 mg/dL — ABNORMAL HIGH (ref 70–99)
Potassium: 3.6 mmol/L (ref 3.5–5.1)
Sodium: 139 mmol/L (ref 135–145)
Total Bilirubin: 0.5 mg/dL (ref 0.3–1.2)
Total Protein: 7.4 g/dL (ref 6.5–8.1)

## 2022-03-13 LAB — CBC
HCT: 36.2 % (ref 36.0–46.0)
Hemoglobin: 11.7 g/dL — ABNORMAL LOW (ref 12.0–15.0)
MCH: 27.3 pg (ref 26.0–34.0)
MCHC: 32.3 g/dL (ref 30.0–36.0)
MCV: 84.4 fL (ref 80.0–100.0)
Platelets: 192 10*3/uL (ref 150–400)
RBC: 4.29 MIL/uL (ref 3.87–5.11)
RDW: 14.2 % (ref 11.5–15.5)
WBC: 11.2 10*3/uL — ABNORMAL HIGH (ref 4.0–10.5)
nRBC: 0 % (ref 0.0–0.2)

## 2022-03-13 LAB — URINALYSIS, ROUTINE W REFLEX MICROSCOPIC
Bilirubin Urine: NEGATIVE
Glucose, UA: NEGATIVE mg/dL
Hgb urine dipstick: NEGATIVE
Ketones, ur: NEGATIVE mg/dL
Leukocytes,Ua: NEGATIVE
Nitrite: NEGATIVE
Protein, ur: NEGATIVE mg/dL
Specific Gravity, Urine: 1.016 (ref 1.005–1.030)
pH: 7 (ref 5.0–8.0)

## 2022-03-13 LAB — LIPASE, BLOOD: Lipase: 30 U/L (ref 11–51)

## 2022-03-13 LAB — I-STAT BETA HCG BLOOD, ED (MC, WL, AP ONLY): I-stat hCG, quantitative: <5 m[IU]/mL (ref <5)

## 2022-03-13 MED ORDER — IOHEXOL 300 MG/ML  SOLN
100.0000 mL | Freq: Once | INTRAMUSCULAR | Status: AC | PRN
  Administered 2022-03-13: 100 mL via INTRAVENOUS

## 2022-03-13 MED ORDER — CEPHALEXIN 500 MG PO CAPS: 500.0000 mg | ORAL_CAPSULE | Freq: Four times a day (QID) | ORAL | 0 refills | Status: AC

## 2022-03-13 MED ORDER — ALUM & MAG HYDROXIDE-SIMETH 200-200-20 MG/5ML PO SUSP
30.0000 mL | Freq: Once | ORAL | Status: AC
  Administered 2022-03-13: 30 mL via ORAL
  Filled 2022-03-13: qty 30

## 2022-03-13 MED ORDER — SODIUM CHLORIDE 0.9 % IV BOLUS
1000.0000 mL | Freq: Once | INTRAVENOUS | Status: AC
  Administered 2022-03-13: 1000 mL via INTRAVENOUS

## 2022-03-13 MED ORDER — ONDANSETRON HCL 4 MG/2ML IJ SOLN
4.0000 mg | Freq: Once | INTRAMUSCULAR | Status: AC
  Administered 2022-03-13: 4 mg via INTRAVENOUS
  Filled 2022-03-13: qty 2

## 2022-03-13 MED ORDER — HYDROMORPHONE HCL 2 MG/ML IJ SOLN
1.0000 mg | Freq: Once | INTRAMUSCULAR | Status: AC
  Administered 2022-03-13: 1 mg via INTRAVENOUS
  Filled 2022-03-13: qty 1

## 2022-03-13 MED ORDER — DICYCLOMINE HCL 10 MG/5ML PO SOLN
10.0000 mg | Freq: Once | ORAL | Status: AC
  Administered 2022-03-13: 10 mg via ORAL
  Filled 2022-03-13 (×2): qty 5

## 2022-03-13 MED ORDER — ONDANSETRON 8 MG PO TBDP
8.0000 mg | ORAL_TABLET | Freq: Three times a day (TID) | ORAL | 0 refills | Status: AC | PRN
Start: 2022-03-13 — End: 2022-03-20

## 2022-03-13 NOTE — Discharge Instructions (Addendum)
Your CT results were concerning for a possible infection of your kidneys. I have put you on antibiotics to treat this.   You also had some abnormal gallbladder findings that were pretty inconclusive, but there is no evidence of any gallbladder infection based on your imaging that you received today. I have provided you with a general surgery referral with Dr. Carolynne Edouard. Please call him to schedule an outpatient visit to have your gallbladder evaluated.   Please drink plenty of fluids. I have provided you a prescription for zofran which is for nausea as needed. If you start developing worsening of symptoms or are unable to tolerate fluids, please return to the emergency department.   Please contact your PCP for follow up regarding your results today

## 2022-03-13 NOTE — ED Notes (Signed)
Pt unable to use bathroom at the moment. Pt given an specimen cup.

## 2022-03-13 NOTE — ED Triage Notes (Signed)
Patient coming to ED for evaluation of upper abdominal pain. Reports pain started an hour PTA.  C/o nausea/vomiting

## 2022-03-13 NOTE — ED Provider Notes (Signed)
[facility]-EMERGENCY DEPT Provider Note   CSN: [id number] Arrival date & time: 03/13/22  0646     History PMH: cholelithiasis Chief Complaint  Patient presents with   Abdominal Pain    Judy Ballard is a 21 y.o. female. Presents the ED with abdominal pain.  Patient states that it started sometime last night, but her mom was woken up around 6 AM due to the intense pain.  Patient states that it is in her right upper quadrant and epigastric region of her abdomen.  Had associated nausea and vomiting since then.  She has had intermittent pains like this in the past, however nothing to this extent.  She says last May she was diagnosed with possible cholelithiasis as well as having renal stones.  She never had any surgery to have this removed, and is unclear if anything was done at that time.  Symptoms reportedly had gone away though so it was never readdressed. Patient denies any fevers, chills, dysuria, hematuria, flank pain, diarrhea, constipation, chest pain, shortness of breath, vaginal discharge, pelvic pain. She last ate at 1130 last night   Abdominal Pain Associated symptoms: nausea and vomiting   Associated symptoms: no chest pain, no chills, no constipation, no diarrhea, no dysuria, no fever, no hematuria, no shortness of breath and no vaginal discharge        Home Medications Prior to Admission medications   Medication Sig Start Date End Date Taking? Authorizing Provider  cephALEXin (KEFLEX) 500 MG capsule Take 1 capsule (500 mg total) by mouth 4 (four) times daily for 10 days. 03/13/22 03/23/22 Yes Tejas Seawood, Finis Bud, PA-C  ondansetron (ZOFRAN-ODT) 8 MG disintegrating tablet Take 1 tablet (8 mg total) by mouth every 8 (eight) hours as needed for up to 7 days for nausea or vomiting. 03/13/22 03/20/22 Yes Konica Stankowski, Finis Bud, PA-C  acetaminophen (TYLENOL) 500 MG tablet Take 2 tablets (1,000 mg total) by mouth every 4 (four) hours as needed for mild pain  (temperature > 101.5.). Patient not taking: Reported on 03/13/2022 10/07/21   Gerrit Heck, CNM  naproxen (NAPROSYN) 500 MG tablet Take 1 tablet (500 mg total) by mouth 2 (two) times daily with a meal. Patient not taking: Reported on 03/13/2022 10/28/21   Mardella Layman, MD      Allergies    Apple juice and Fish allergy    Review of Systems   Review of Systems  Constitutional:  Negative for chills and fever.  Respiratory:  Negative for shortness of breath.   Cardiovascular:  Negative for chest pain.  Gastrointestinal:  Positive for abdominal pain, nausea and vomiting. Negative for abdominal distention, constipation and diarrhea.  Genitourinary:  Negative for dysuria, flank pain, hematuria, pelvic pain and vaginal discharge.  All other systems reviewed and are negative.   Physical Exam Updated Vital Signs BP 121/87   Pulse (!) 105   Temp 99 F (37.2 C) (Oral)   Resp 18   Ht 5\' 3"  (1.6 m)   Wt 88.9 kg   SpO2 100%   BMI 34.72 kg/m  Physical Exam Vitals and nursing note reviewed.  Constitutional:      General: She is not in acute distress.    Appearance: Normal appearance. She is well-developed. She is not ill-appearing, toxic-appearing or diaphoretic.  HENT:     Head: Normocephalic and atraumatic.     Nose: No nasal deformity.     Mouth/Throat:     Lips: Pink. No lesions.  Eyes:     General:  Gaze aligned appropriately. No scleral icterus.       Right eye: No discharge.        Left eye: No discharge.     Conjunctiva/sclera: Conjunctivae normal.     Right eye: Right conjunctiva is not injected. No exudate or hemorrhage.    Left eye: Left conjunctiva is not injected. No exudate or hemorrhage. Pulmonary:     Effort: Pulmonary effort is normal. No respiratory distress.  Abdominal:     General: Abdomen is flat. There is no distension.     Palpations: Abdomen is soft.     Tenderness: There is abdominal tenderness in the right upper quadrant and epigastric area. There is no right  CVA tenderness, left CVA tenderness, guarding or rebound. Positive signs include Murphy's sign. Negative signs include McBurney's sign.     Hernia: No hernia is present.  Skin:    General: Skin is warm and dry.  Neurological:     Mental Status: She is alert and oriented to person, place, and time.  Psychiatric:        Mood and Affect: Mood normal.        Speech: Speech normal.        Behavior: Behavior normal. Behavior is cooperative.     ED Results / Procedures / Treatments   Labs (all labs ordered are listed, but only abnormal results are displayed) Labs Reviewed  COMPREHENSIVE METABOLIC PANEL - Abnormal; Notable for the following components:      Result Value   Glucose, Bld 187 (*)    AST 56 (*)    All other components within normal limits  CBC - Abnormal; Notable for the following components:   WBC 11.2 (*)    Hemoglobin 11.7 (*)    All other components within normal limits  RAPID URINE DRUG SCREEN, HOSP PERFORMED - Abnormal; Notable for the following components:   Tetrahydrocannabinol POSITIVE (*)    All other components within normal limits  LIPASE, BLOOD  URINALYSIS, ROUTINE W REFLEX MICROSCOPIC  I-STAT BETA HCG BLOOD, ED ([facility], [facility], [facility] ONLY)    EKG None  Radiology CT ABDOMEN PELVIS W CONTRAST  Result Date: 03/13/2022 CLINICAL DATA:  Upper abdominal pain with nausea and vomiting. EXAM: CT ABDOMEN AND PELVIS WITH CONTRAST TECHNIQUE: Multidetector CT imaging of the abdomen and pelvis was performed using the standard protocol following bolus administration of intravenous contrast. RADIATION DOSE REDUCTION: This exam was performed according to the departmental dose-optimization program which includes automated exposure control, adjustment of the mA and/or kV according to patient size and/or use of iterative reconstruction technique. CONTRAST:  OMNIPAQUE IOHEXOL 300 MG/ML  SOLN COMPARISON:  Right upper quadrant ultrasound earlier today. FINDINGS: Lower chest: Minor  hypoventilatory atelectasis. No pleural fluid or confluent airspace disease. Hepatobiliary: Mild subjective hepatic steatosis. The liver is enlarged spanning 21.4 cm cranial caudal. There is a 15 mm hyperechoic lesion in the subcapsular left lobe, no lesion was seen on ultrasound. Partially distended gallbladder, no calcified gallstone. No gallbladder air. No pericholecystic fat stranding. No biliary dilatation. Pancreas: No ductal dilatation or inflammation. Spleen: Upper normal in size spanning 13.1 cm cranial caudal. No focal lesion. Adrenals/Urinary Tract: Normal adrenal glands. There is heterogeneous enhancement of the right kidney. No definite perinephric edema. There is calyceal dilatation of the major calices but no renal pelvis dilatation or hydronephrosis. The ureter is decompressed. No renal calculi or focal lesion. Unremarkable appearance of the left kidney. Partially distended urinary bladder, normal for degree of distension. Stomach/Bowel: Stomach is  within normal limits. Appendix appears normal. No evidence of bowel wall thickening, distention, or inflammatory changes. Vascular/Lymphatic: Normal caliber abdominal aorta. No acute vascular findings. Few prominent central mesenteric nodes, typically reactive. Reproductive: The uterus is anteverted, unremarkable in CT appearance. There is a peripherally enhancing 2.3 cm corpus luteal cyst in the left ovary, needs no further follow-up. Vertebrae not definitively seen. No suspicious adnexal mass. Other: Minimal free fluid in the pelvic cul-de-sac. No upper abdominal ascites. No free intra-abdominal air. Musculoskeletal: There are no acute or suspicious osseous abnormalities. IMPRESSION: 1. Heterogeneous enhancement of the right kidney, suspicious for pyelonephritis. Recommend correlation with urinalysis. There is mild calyceal dilatation of the right kidney but no frank hydronephrosis. 2. Hepatomegaly and mild hepatic steatosis. A 15 mm hyperechoic lesion  in the subcapsular left lobe of the liver is nonspecific, but likely a hemangioma. No sonographic correlate was seen on ultrasound earlier today. This could be further evaluated with hepatic protocol MRI on a nonemergent basis. 3. Borderline splenomegaly. Electronically Signed   By: Narda RutherfordMelanie  Sanford M.D.   On: 03/13/2022 15:54   US Abdomen Limited RUQ (LIVER/GB)  Result Date: 03/13/2022 CLINICAL DATA:  Pain right upper quadrant EXAM: ULTRASOUND ABDOMEN LIMITED RIGHT UPPER QUADRANT COMPARISON:  10/01/2021 FINDINGS: Gallbladder: Gallbladder is not sonographically visualized. There is acoustic shadowing in the gallbladder fossa. Technologist did not observe any tenderness over the gallbladder fossa during the study. Common bile duct: Diameter: 3 mm Liver: There is increased echogenicity in liver suggesting fatty infiltration. No focal abnormalities are seen in visualized portions of liver. Portal vein is patent on color Doppler imaging with normal direction of blood flow towards the liver. Other: None. IMPRESSION: There is acoustic shadowing and gallbladder fossa. Differential diagnostic possibilities would include contracted gallbladder filled with stones or previous cholecystectomy or bowel loop lying in the gallbladder fossa. Similar finding was seen in the previous study. Technologist did not observe any tenderness over the gallbladder fossa. There is no dilation of bile ducts. Fatty liver. Electronically Signed   By: Ernie AvenaPalani  Rathinasamy M.D.   On: 03/13/2022 14:41    Procedures Procedures  This patient was on telemetry or cardiac monitoring during their time in the ED.    Medications Ordered in ED Medications  HYDROmorphone (DILAUDID) injection 1 mg (1 mg Intravenous Given 03/13/22 1236)  alum & mag hydroxide-simeth (MAALOX/MYLANTA) 200-200-20 MG/5ML suspension 30 mL (30 mLs Oral Given 03/13/22 1235)  dicyclomine (BENTYL) 10 MG/5ML solution 10 mg (10 mg Oral Given 03/13/22 1354)  ondansetron (ZOFRAN)  injection 4 mg (4 mg Intravenous Given 03/13/22 1235)  sodium chloride 0.9 % bolus 1,000 mL (0 mLs Intravenous Stopped 03/13/22 1544)  ondansetron (ZOFRAN) injection 4 mg (4 mg Intravenous Given 03/13/22 1544)  iohexol (OMNIPAQUE) 300 MG/ML solution 100 mL (100 mLs Intravenous Contrast Given 03/13/22 1521)    ED Course/ Medical Decision Making/ A&P Clinical Course as of 03/13/22 1633  Sat Mar 13, 2022  1605 CT a/p reveals signs concerning for possible pyelonephritis. No gallstones are seen, but she does have partially distended gallbladder which again is nonspecific. No findings that are indicative of cholecystitis [GL]    Clinical Course User Index [GL] Zaylen Susman, Finis BudGrace C, PA-C                           Medical Decision Making Amount and/or Complexity of Data Reviewed Labs: ordered. Radiology: ordered.  Risk OTC drugs. Prescription drug management.    MDM  This is a  21 y.o. female who presents to the ED with upper abdominal pain, nausea, and vomiting for about 8 hours The differential of this patient includes but is not limited to cholelithiasis, cholecystitis, GERD, pancreatitis, gastritis, gastroparesis, THC cannabinoid syndrome, SBO, pyelonephritis, etc.   Initial Impression  Appears to be in pain, but in no other type of distress. Does not appear ill. Vitals stable. Temp 99.1 RUQ and epigastric tenderness present on exam. low suspicion for cardiac etiology. Will start with RUQ [place]. I reviewed prior US results that were very inconclusive for gallbladder disease.  Labs obtained in triage.  Pain meds, anti emetic, and GI cocktail provided.   I personally ordered, reviewed, and interpreted all laboratory work and imaging and agree with radiologist interpretation. Results interpreted below:  WBC 11.2, Hgb 11.7 (stable) AST 56, but other LFTs normal. T bili normal Glucose 187, no gap or acidosis. Electrolytes okay, kidney function okay. Lipase 30 Pregnancy negative UDS + THC UA  normal [place] results There is acoustic shadowing and gallbladder fossa. Differential  diagnostic possibilities would include contracted gallbladder filled  with stones or previous cholecystectomy or bowel loop lying in the  gallbladder fossa. Similar finding was seen in the previous study.  Technologist did not observe any tenderness over the gallbladder  fossa. There is no dilation of bile ducts.    Fatty liver.  CT a/p results: 1. Heterogeneous enhancement of the right kidney, suspicious for  pyelonephritis. Recommend correlation with urinalysis. There is mild  calyceal dilatation of the right kidney but no frank hydronephrosis.  2. Hepatomegaly and mild hepatic steatosis. A 15 mm hyperechoic  lesion in the subcapsular left lobe of the liver is nonspecific, but  likely a hemangioma. No sonographic correlate was seen on ultrasound  earlier today. This could be further evaluated with hepatic protocol  MRI on a nonemergent basis.  3. Borderline splenomegaly.    Assessment/Plan:  Patient was reassessed after medication. Her pain has resolved, but continues to be nauseous. She has not had any episodes of emesis since being here. She has tolerated PO. RUQ [place] was fairly inconclusive with similar results to US done last March, and there was no sonography murphy sign. I ordered a CT scan to further evaluate which showed a mildly distended gallbladder, no wall thickening, no stones making cholecystitis unlikely. CT scan did note a heterogenous enhancement of the right kidney suspicious for pyelonephritis. UA was not notable for any bacteria, but will send off for culture to check this. Will start on abx to treat possible kidney infection. She was also noted to have hepatomegaly and hepatic steatosis as well as a hyperechoic lesion in the left lobe of the liver that needs to be followed up outpatient. Borderline splenomegaly noted which is nonspecific.  She did test + for [facility] making hyperemesis more  likely, but this is her first presentation with these symptoms so she will need further evaluation to r/o other causes.  Her symptoms have overall improved, and she has remained stable. She is appropriate for discharge. I have instructed that she follow up with her PCP ASAP for reevaluation. I have also provided general surgery follow up to further evaluate her for cholelithiasis on an outpatient basis.     Charting Requirements Additional history is obtained from:  Independent historian and Parent/Guardian External Records from outside source obtained and reviewed including: Prior RUQ [place] results Social Determinants of Health:  none Pertinant PMH that complicates patient's illness: questionable history of cholelithiasis  Patient Care Problems that  were addressed during this visit: - Epigastric pain: Acute illness with systemic symptoms - N/V: Acute illness with systemic symptoms This patient was maintained on a cardiac monitor/telemetry. I personally viewed and interpreted the cardiac monitor which reveals an underlying rhythm of NSR Medications given in ED: Zofran, IVF, GI cocktail, dilaudid Reevaluation of the patient after these medicines showed that the patient improved I have reviewed home medications and made changes accordingly.  Critical Care Interventions: n/a Consultations: n/a Disposition: discharge  Portions of this note were generated with Dragon dictation software. Dictation errors may occur despite best attempts at proofreading.     Final Clinical Impression(s) / ED Diagnoses Final diagnoses:  Epigastric pain  Nausea and vomiting, unspecified vomiting type    Rx / DC Orders ED Discharge Orders          Ordered    ondansetron (ZOFRAN-ODT) 8 MG disintegrating tablet  Every 8 hours PRN        03/13/22 1615    cephALEXin (KEFLEX) 500 MG capsule  4 times daily        03/13/22 1615              Lamoine Magallon, Finis Bud, PA-C 03/13/22 1633    Mardene Sayer,  MD 03/13/22 1701

## 2022-03-26 DIAGNOSIS — R197 Diarrhea, unspecified: Secondary | ICD-10-CM | POA: Diagnosis not present

## 2022-03-26 DIAGNOSIS — R112 Nausea with vomiting, unspecified: Secondary | ICD-10-CM | POA: Diagnosis not present

## 2022-07-12 ENCOUNTER — Other Ambulatory Visit: Payer: Self-pay

## 2022-07-12 ENCOUNTER — Encounter (HOSPITAL_BASED_OUTPATIENT_CLINIC_OR_DEPARTMENT_OTHER): Payer: Self-pay

## 2022-07-12 DIAGNOSIS — R1084 Generalized abdominal pain: Secondary | ICD-10-CM | POA: Insufficient documentation

## 2022-07-12 DIAGNOSIS — Z5321 Procedure and treatment not carried out due to patient leaving prior to being seen by health care provider: Secondary | ICD-10-CM | POA: Diagnosis not present

## 2022-07-12 LAB — CBC
HCT: 34.8 % — ABNORMAL LOW (ref 36.0–46.0)
Hemoglobin: 11.5 g/dL — ABNORMAL LOW (ref 12.0–15.0)
MCH: 27.2 pg (ref 26.0–34.0)
MCHC: 33 g/dL (ref 30.0–36.0)
MCV: 82.3 fL (ref 80.0–100.0)
Platelets: 235 10*3/uL (ref 150–400)
RBC: 4.23 MIL/uL (ref 3.87–5.11)
RDW: 14.2 % (ref 11.5–15.5)
WBC: 8 10*3/uL (ref 4.0–10.5)
nRBC: 0 % (ref 0.0–0.2)

## 2022-07-12 LAB — COMPREHENSIVE METABOLIC PANEL
ALT: 31 U/L (ref 0–44)
AST: 42 U/L — ABNORMAL HIGH (ref 15–41)
Albumin: 4.4 g/dL (ref 3.5–5.0)
Alkaline Phosphatase: 83 U/L (ref 38–126)
Anion gap: 9 (ref 5–15)
BUN: 10 mg/dL (ref 6–20)
CO2: 25 mmol/L (ref 22–32)
Calcium: 9.5 mg/dL (ref 8.9–10.3)
Chloride: 104 mmol/L (ref 98–111)
Creatinine, Ser: 0.66 mg/dL (ref 0.44–1.00)
GFR, Estimated: >60 mL/min (ref >60)
Glucose, Bld: 117 mg/dL — ABNORMAL HIGH (ref 70–99)
Potassium: 3.3 mmol/L — ABNORMAL LOW (ref 3.5–5.1)
Sodium: 138 mmol/L (ref 135–145)
Total Bilirubin: 0.4 mg/dL (ref 0.3–1.2)
Total Protein: 8 g/dL (ref 6.5–8.1)

## 2022-07-12 LAB — LIPASE, BLOOD: Lipase: 15 U/L (ref 11–51)

## 2022-07-12 NOTE — ED Triage Notes (Signed)
Pt reports generalized abdominal pain x 1 hour that she describes as constant and sharp. Denies dysuria.

## 2022-07-13 ENCOUNTER — Emergency Department (HOSPITAL_BASED_OUTPATIENT_CLINIC_OR_DEPARTMENT_OTHER)
Admission: EM | Admit: 2022-07-13 | Discharge: 2022-07-13 | Discharge status: Against Medical Advice | Payer: Medicaid Other | Attending: Emergency Medicine | Admitting: Emergency Medicine

## 2022-07-13 LAB — URINALYSIS, ROUTINE W REFLEX MICROSCOPIC
Bilirubin Urine: NEGATIVE
Glucose, UA: NEGATIVE mg/dL
Ketones, ur: NEGATIVE mg/dL
Nitrite: POSITIVE — AB
Specific Gravity, Urine: 1.02 (ref 1.005–1.030)
WBC, UA: >50 WBC/hpf — ABNORMAL HIGH (ref 0–5)
pH: 5.5 (ref 5.0–8.0)

## 2022-07-13 LAB — PREGNANCY, URINE: Preg Test, Ur: NEGATIVE

## 2022-07-13 NOTE — ED Notes (Addendum)
Called for Pt. No answer. Checked Both Waiting Rooms

## 2022-09-22 ENCOUNTER — Emergency Department (HOSPITAL_COMMUNITY)
Admission: EM | Admit: 2022-09-22 | Discharge: 2022-09-22 | Disposition: A | Discharge status: Home / Self Care | Payer: Medicaid Other | Attending: Emergency Medicine | Admitting: Emergency Medicine

## 2022-09-22 ENCOUNTER — Other Ambulatory Visit: Payer: Self-pay

## 2022-09-22 ENCOUNTER — Emergency Department (HOSPITAL_COMMUNITY): Payer: Medicaid Other

## 2022-09-22 DIAGNOSIS — R1013 Epigastric pain: Secondary | ICD-10-CM | POA: Diagnosis not present

## 2022-09-22 DIAGNOSIS — N3000 Acute cystitis without hematuria: Secondary | ICD-10-CM | POA: Insufficient documentation

## 2022-09-22 DIAGNOSIS — D649 Anemia, unspecified: Secondary | ICD-10-CM | POA: Diagnosis not present

## 2022-09-22 DIAGNOSIS — R109 Unspecified abdominal pain: Secondary | ICD-10-CM | POA: Diagnosis not present

## 2022-09-22 DIAGNOSIS — R1110 Vomiting, unspecified: Secondary | ICD-10-CM | POA: Diagnosis not present

## 2022-09-22 LAB — CBC WITH DIFFERENTIAL/PLATELET
Abs Immature Granulocytes: 0.04 10*3/uL (ref 0.00–0.07)
Basophils Absolute: 0 10*3/uL (ref 0.0–0.1)
Basophils Relative: 0 %
Eosinophils Absolute: 0.1 10*3/uL (ref 0.0–0.5)
Eosinophils Relative: 1 %
HCT: 35.9 % — ABNORMAL LOW (ref 36.0–46.0)
Hemoglobin: 11.6 g/dL — ABNORMAL LOW (ref 12.0–15.0)
Immature Granulocytes: 1 %
Lymphocytes Relative: 17 %
Lymphs Abs: 1.4 10*3/uL (ref 0.7–4.0)
MCH: 27 pg (ref 26.0–34.0)
MCHC: 32.3 g/dL (ref 30.0–36.0)
MCV: 83.7 fL (ref 80.0–100.0)
Monocytes Absolute: 0.5 10*3/uL (ref 0.1–1.0)
Monocytes Relative: 6 %
Neutro Abs: 6.3 10*3/uL (ref 1.7–7.7)
Neutrophils Relative %: 75 %
Platelets: 203 10*3/uL (ref 150–400)
RBC: 4.29 MIL/uL (ref 3.87–5.11)
RDW: 14 % (ref 11.5–15.5)
WBC: 8.4 10*3/uL (ref 4.0–10.5)
nRBC: 0 % (ref 0.0–0.2)

## 2022-09-22 LAB — COMPREHENSIVE METABOLIC PANEL
ALT: 18 U/L (ref 0–44)
AST: 46 U/L — ABNORMAL HIGH (ref 15–41)
Albumin: 3.5 g/dL (ref 3.5–5.0)
Alkaline Phosphatase: 92 U/L (ref 38–126)
Anion gap: 12 (ref 5–15)
BUN: 12 mg/dL (ref 6–20)
CO2: 21 mmol/L — ABNORMAL LOW (ref 22–32)
Calcium: 9 mg/dL (ref 8.9–10.3)
Chloride: 106 mmol/L (ref 98–111)
Creatinine, Ser: 0.75 mg/dL (ref 0.44–1.00)
GFR, Estimated: >60 mL/min (ref >60)
Glucose, Bld: 139 mg/dL — ABNORMAL HIGH (ref 70–99)
Potassium: 4.2 mmol/L (ref 3.5–5.1)
Sodium: 139 mmol/L (ref 135–145)
Total Bilirubin: 1.1 mg/dL (ref 0.3–1.2)
Total Protein: 7 g/dL (ref 6.5–8.1)

## 2022-09-22 LAB — I-STAT BETA HCG BLOOD, ED (MC, WL, AP ONLY): I-stat hCG, quantitative: <5 m[IU]/mL (ref <5)

## 2022-09-22 LAB — URINALYSIS, MICROSCOPIC (REFLEX)

## 2022-09-22 LAB — URINALYSIS, ROUTINE W REFLEX MICROSCOPIC
Bilirubin Urine: NEGATIVE
Glucose, UA: NEGATIVE mg/dL
Ketones, ur: NEGATIVE mg/dL
Nitrite: POSITIVE — AB
Protein, ur: NEGATIVE mg/dL
Specific Gravity, Urine: 1.01 (ref 1.005–1.030)
pH: 7 (ref 5.0–8.0)

## 2022-09-22 LAB — LIPASE, BLOOD: Lipase: 41 U/L (ref 11–51)

## 2022-09-22 MED ORDER — CEPHALEXIN 500 MG PO CAPS: 500.0000 mg | ORAL_CAPSULE | Freq: Four times a day (QID) | ORAL | 0 refills | Status: DC

## 2022-09-22 MED ORDER — FENTANYL CITRATE PF 50 MCG/ML IJ SOSY: 25.0000 ug | PREFILLED_SYRINGE | Freq: Once | INTRAMUSCULAR | Status: DC

## 2022-09-22 MED ORDER — KETOROLAC TROMETHAMINE 15 MG/ML IJ SOLN
15.0000 mg | Freq: Once | INTRAMUSCULAR | Status: AC
  Administered 2022-09-22: 15 mg via INTRAVENOUS
  Filled 2022-09-22: qty 1

## 2022-09-22 MED ORDER — ONDANSETRON 4 MG PO TBDP: 4.0000 mg | ORAL_TABLET | Freq: Three times a day (TID) | ORAL | 0 refills | Status: DC | PRN

## 2022-09-22 MED ORDER — FAMOTIDINE 20 MG PO TABS: 20.0000 mg | ORAL_TABLET | Freq: Two times a day (BID) | ORAL | 0 refills | Status: DC

## 2022-09-22 MED ORDER — ONDANSETRON HCL 4 MG/2ML IJ SOLN
4.0000 mg | Freq: Once | INTRAMUSCULAR | Status: AC
  Administered 2022-09-22: 4 mg via INTRAVENOUS
  Filled 2022-09-22: qty 2

## 2022-09-22 MED ORDER — SODIUM CHLORIDE 0.9 % IV BOLUS
1000.0000 mL | Freq: Once | INTRAVENOUS | Status: AC
  Administered 2022-09-22: 1000 mL via INTRAVENOUS

## 2022-09-22 MED ORDER — IOHEXOL 350 MG/ML SOLN
75.0000 mL | Freq: Once | INTRAVENOUS | Status: AC | PRN
  Administered 2022-09-22: 75 mL via INTRAVENOUS

## 2022-09-22 MED ORDER — SODIUM CHLORIDE 0.9 % IV SOLN: INTRAVENOUS | Status: DC

## 2022-09-22 NOTE — Discharge Instructions (Addendum)
Please read and follow all provided instructions.  Your diagnoses today include:  1. Epigastric pain   2. Acute cystitis without hematuria     Tests performed today include: Blood cell counts and platelets: Normal white blood cell count Kidney and liver function tests: Normal kidney function test Pancreas function test (called lipase) Urine test to look for infection: Suggest signs of infection A blood or urine test for pregnancy (women only) CT scan of your abdomen pelvis: Did not show any signs of problems Vital signs. See below for your results today.   Medications prescribed:  Zofran (ondansetron) - for nausea and vomiting  Pepcid (famotidine) - antihistamine  You can find this medication over-the-counter.   DO NOT exceed:  '20mg'$  Pepcid every 12 hours  Keflex (cephalexin) - antibiotic  You have been prescribed an antibiotic medicine: take the entire course of medicine even if you are feeling better. Stopping early can cause the antibiotic not to work.  Take any prescribed medications only as directed.  Home care instructions:  Follow any educational materials contained in this packet.  Follow-up instructions: Please follow-up with your primary care provider in the next 2 days for further evaluation of your symptoms.    Return instructions:  SEEK IMMEDIATE MEDICAL ATTENTION IF: The pain does not go away or becomes severe  A temperature above 101F develops  Repeated vomiting occurs (multiple episodes)  The pain becomes localized to portions of the abdomen. The right side could possibly be appendicitis. In an adult, the left lower portion of the abdomen could be colitis or diverticulitis.  Blood is being passed in stools or vomit (bright red or black tarry stools)  You develop chest pain, difficulty breathing, dizziness or fainting, or become confused, poorly responsive, or inconsolable (young children) If you have any other emergent concerns regarding your  health  Additional Information: Abdominal (belly) pain can be caused by many things. Your caregiver performed an examination and possibly ordered blood/urine tests and imaging (CT scan, x-rays, ultrasound). Many cases can be observed and treated at home after initial evaluation in the emergency department. Even though you are being discharged home, abdominal pain can be unpredictable. Therefore, you need a repeated exam if your pain does not resolve, returns, or worsens. Most patients with abdominal pain don't have to be admitted to the hospital or have surgery, but serious problems like appendicitis and gallbladder attacks can start out as nonspecific pain. Many abdominal conditions cannot be diagnosed in one visit, so follow-up evaluations are very important.  Your vital signs today were: BP 132/78 (BP Location: Right Arm)   Pulse 84   Temp 98.6 F (37 C) (Oral)   Resp 17   Ht (!) 2" (0.051 m)   Wt 88.9 kg   SpO2 99%   BMI 34450.76 kg/m  If your blood pressure (bp) was elevated above 135/85 this visit, please have this repeated by your doctor within one month. --------------

## 2022-09-22 NOTE — ED Provider Notes (Signed)
[facility] Provider Note   CSN: [id number] Arrival date & time: 09/22/22  1008     History  Chief Complaint  Patient presents with   Abdominal Pain    Judy Ballard is a 22 y.o. female.  Patient with no history of abdominal surgeries presents to the emergency department today for evaluation of sharp pain in the epigastrium starting this morning.  Patient states that she gets these episodes, usually lasting about 1 day, every several months.  She had 1 episode of vomiting today while traveling to the emergency department.  No diarrhea.  No suspicious food or water exposures, although eating typically makes his symptoms worse.  She denies heavy alcohol or NSAID use.  No urinary symptoms.  No blood in the stool.       Home Medications Prior to Admission medications   Medication Sig Start Date End Date Taking? Authorizing Provider  acetaminophen (TYLENOL) 500 MG tablet Take 2 tablets (1,000 mg total) by mouth every 4 (four) hours as needed for mild pain (temperature > 101.5.). Patient not taking: Reported on 03/13/2022 10/07/21   Gavin Pound, CNM  naproxen (NAPROSYN) 500 MG tablet Take 1 tablet (500 mg total) by mouth 2 (two) times daily with a meal. Patient not taking: Reported on 03/13/2022 10/28/21   Vanessa Kick, MD      Allergies    Apple juice and Fish allergy    Review of Systems   Review of Systems  Physical Exam Updated Vital Signs BP 132/78 (BP Location: Right Arm)   Pulse 84   Temp 98.6 F (37 C) (Oral)   Resp 17   Ht (!) 2" (0.051 m)   Wt 88.9 kg   SpO2 99%   BMI 34450.76 kg/m  Physical Exam Vitals and nursing note reviewed.  Constitutional:      General: She is not in acute distress.    Appearance: She is well-developed.  HENT:     Head: Normocephalic and atraumatic.     Right Ear: External ear normal.     Left Ear: External ear normal.     Nose: Nose normal.  Eyes:     Conjunctiva/sclera:  Conjunctivae normal.  Cardiovascular:     Rate and Rhythm: Normal rate and regular rhythm.     Heart sounds: No murmur heard. Pulmonary:     Effort: No respiratory distress.     Breath sounds: No wheezing, rhonchi or rales.  Abdominal:     Palpations: Abdomen is soft.     Tenderness: There is abdominal tenderness (Mild) in the epigastric area. There is no guarding or rebound. Negative signs include Murphy's sign and McBurney's sign.  Musculoskeletal:     Cervical back: Normal range of motion and neck supple.     Right lower leg: No edema.     Left lower leg: No edema.  Skin:    General: Skin is warm and dry.     Findings: No rash.  Neurological:     General: No focal deficit present.     Mental Status: She is alert. Mental status is at baseline.     Motor: No weakness.  Psychiatric:        Mood and Affect: Mood normal.     ED Results / Procedures / Treatments   Labs (all labs ordered are listed, but only abnormal results are displayed) Labs Reviewed  COMPREHENSIVE METABOLIC PANEL - Abnormal; Notable for the following components:  Result Value   CO2 21 (*)    Glucose, Bld 139 (*)    AST 46 (*)    All other components within normal limits  CBC WITH DIFFERENTIAL/PLATELET - Abnormal; Notable for the following components:   Hemoglobin 11.6 (*)    HCT 35.9 (*)    All other components within normal limits  URINALYSIS, ROUTINE W REFLEX MICROSCOPIC - Abnormal; Notable for the following components:   APPearance HAZY (*)    Hgb urine dipstick TRACE (*)    Nitrite POSITIVE (*)    Leukocytes,Ua SMALL (*)    All other components within normal limits  URINALYSIS, MICROSCOPIC (REFLEX) - Abnormal; Notable for the following components:   Bacteria, UA MANY (*)    All other components within normal limits  URINE CULTURE  LIPASE, BLOOD  I-STAT BETA HCG BLOOD, ED ([facility], [facility], [facility] ONLY)    EKG None  Radiology CT ABDOMEN PELVIS W CONTRAST  Result Date: 09/22/2022 CLINICAL DATA:   Abdominal pain, acute, nonlocalized. Nausea and vomiting. EXAM: CT ABDOMEN AND PELVIS WITH CONTRAST TECHNIQUE: Multidetector CT imaging of the abdomen and pelvis was performed using the standard protocol following bolus administration of intravenous contrast. RADIATION DOSE REDUCTION: This exam was performed according to the departmental dose-optimization program which includes automated exposure control, adjustment of the mA and/or kV according to patient size and/or use of iterative reconstruction technique. CONTRAST:  64m OMNIPAQUE IOHEXOL 350 MG/ML SOLN COMPARISON:  03/13/2022.  Previous ultrasound studies. FINDINGS: Lower chest: Lung bases are clear.  No pleural or pericardial fluid. Hepatobiliary: Liver parenchyma is normal. No calcified gallstones. Previously, the gallbladder was seen by ultrasound the filled with shadowing material, presumed pigment stones. No CT evidence of acute biliary obstruction or gallbladder inflammation. Pancreas: Normal Spleen: Normal Adrenals/Urinary Tract: Adrenal glands are normal. Left kidney is normal. Mild scarring of the right kidney, possibly subsequent to previous pyelonephritis/obstruction. Mild fullness of the right renal collecting system and ureter but no evidence of ureteral stone. No abnormal bladder finding. Stomach/Bowel: Stomach and small intestine are normal. Normal appendix. Normal colon. Vascular/Lymphatic: Aorta and IVC are normal.  No adenopathy. Reproductive: Uterus and adnexal regions are normal. Other: No free fluid or air. Musculoskeletal: Normal IMPRESSION: 1. No acute CT finding to explain the clinical presentation. 2. Mild scarring of the right kidney, possibly subsequent to previous pyelonephritis/renal obstruction. Mild fullness of the right renal collecting system and ureter but no evidence of ureteral stone. 3. Previously, by ultrasound the gallbladder was filled with shadowing material, presumed pigment stones. No calcified stones by CT. Pigment  stones can be invisible. No CT evidence of acute biliary obstruction or gallbladder inflammation. Electronically Signed   By: MNelson ChimesM.D.   On: 09/22/2022 13:07    Procedures Procedures    Medications Ordered in ED Medications  sodium chloride 0.9 % bolus 1,000 mL (1,000 mLs Intravenous New Bag/Given 09/22/22 1414)    And  0.9 %  sodium chloride infusion ( Intravenous New Bag/Given 09/22/22 1420)  ondansetron (ZOFRAN) injection 4 mg (4 mg Intravenous Given 09/22/22 1416)  iohexol (OMNIPAQUE) 350 MG/ML injection 75 mL (75 mLs Intravenous Contrast Given 09/22/22 1256)  ketorolac (TORADOL) 15 MG/ML injection 15 mg (15 mg Intravenous Given 09/22/22 1414)    ED Course/ Medical Decision Making/ A&P    Patient seen and examined. History obtained directly from patient. Work-up including labs, imaging, EKG ordered in triage, if performed, were reviewed.    Labs/EKG: Independently reviewed and interpreted.  This included: CBC with  differential with normal white blood cell count 8.4, mild anemia with hemoglobin of 11.6 otherwise unremarkable; CMP with mildly elevated AST at 46, normal renal function, glucose 139 with normal anion gap; lipase normal; pregnancy negative; UA pending.  Imaging: Independently visualized and interpreted.  This included: CT abdomen pelvis, agree no acute findings.  Medications/Fluids: Ordered: IV Toradol, IV Zofran, fluid bolus   Most recent vital signs reviewed and are as follows: BP 132/78 (BP Location: Right Arm)   Pulse 84   Temp 98.6 F (37 C) (Oral)   Resp 17   Ht (!) 2" (0.051 m)   Wt 88.9 kg   SpO2 99%   BMI 34450.76 kg/m   Initial impression: Epigastric abdominal pain with an episode of vomiting.  Workup to this point reassuring.  Exam without signs of peritonitis.  Will reassess after treatment.  Plan discharge home with symptomatic control and referral to GI.  2:14 PM UA does have signs of UTI. No recent cultures, despite several recent positive  UA's. Will start on abx and send urine cx.   2:57 PM Reassessment performed. Patient appears stable.  Feeling better after Toradol and Zofran.  Fluids finishing.  Reviewed pertinent lab work and imaging with patient at bedside. Questions answered.   Most current vital signs reviewed and are as follows: BP 132/78 (BP Location: Right Arm)   Pulse 84   Temp 98.6 F (37 C) (Oral)   Resp 17   Ht (!) 2" (0.051 m)   Wt 88.9 kg   SpO2 99%   BMI 34450.76 kg/m   Plan: Discharge to home.   Prescriptions written for: Keflex, Pepcid, Zofran  Other home care instructions discussed: Bland diet, good hydration  ED return instructions discussed: The patient was urged to return to the Emergency Department immediately with worsening of current symptoms, worsening abdominal pain, persistent vomiting, blood noted in stools, fever, or any other concerns. The patient verbalized understanding.   Follow-up instructions discussed: Patient encouraged to follow-up with their PCP in 7 days.                                Medical Decision Making Risk Prescription drug management.   For this patient's complaint of abdominal pain, the following conditions were considered on the differential diagnosis: gastritis/PUD, enteritis/duodenitis, appendicitis, cholelithiasis/cholecystitis, cholangitis, pancreatitis, ruptured viscus, colitis, diverticulitis, small/large bowel obstruction, proctitis, cystitis, pyelonephritis, ureteral colic, aortic dissection, aortic aneurysm. In women, ectopic pregnancy, pelvic inflammatory disease, ovarian cysts, and tubo-ovarian abscess were also considered. Atypical chest etiologies were also considered including ACS, PE, and pneumonia.  She does have evidence of UTI without sepsis today.  Will treat and send culture.  The patient's vital signs, pertinent lab work and imaging were reviewed and interpreted as discussed in the ED course. Hospitalization was considered for further  testing, treatments, or serial exams/observation. However as patient is well-appearing, has a stable exam, and reassuring studies today, I do not feel that they warrant admission at this time. This plan was discussed with the patient who verbalizes agreement and comfort with this plan and seems reliable and able to return to the Emergency Department with worsening or changing symptoms.          Final Clinical Impression(s) / ED Diagnoses Final diagnoses:  Epigastric pain  Acute cystitis without hematuria    Rx / DC Orders ED Discharge Orders          Ordered  ondansetron (ZOFRAN-ODT) 4 MG disintegrating tablet  Every 8 hours PRN        09/22/22 1455    cephALEXin (KEFLEX) 500 MG capsule  4 times daily        09/22/22 1455    famotidine (PEPCID) 20 MG tablet  2 times daily        09/22/22 1455              Carlisle Cater, PA-C 09/22/22 1458    Regan Lemming, MD 09/22/22 [phone]

## 2022-09-22 NOTE — ED Triage Notes (Signed)
Pt. Stated, Im having stomach pain onset 1 hour ago. No other symptoms

## 2022-09-22 NOTE — ED Provider Triage Note (Signed)
Emergency Medicine Provider Triage Evaluation Note  MYELLE KALAMA , a 22 y.o. female  was evaluated in triage.  Pt complains of abdominal pain, nausea, vomiting.  She has had episodes similar to this going back at least the past year.  Today's episode is typical, with severe largely central abdominal pain, nausea, vomiting.  No prior abdominal surgery.  No drugs, alcohol, cigarette use.  Review of Systems  Positive: Per HPI Negative: Fever, dyspnea, chest pain  Physical Exam  BP 132/78 (BP Location: Right Arm)   Pulse 84   Temp 98.6 F (37 C) (Oral)   Resp 17   SpO2 99%  Gen:   Awake, no distress speaking clearly Resp:  Normal effort no increased work of breathing MSK:   Moves extremities without difficulty no deformity Other:  Abdominal exam notable for tenderness to palpation midline, no peritoneal findings  Medical Decision Making  Medically screening exam initiated at 10:43 AM.  Appropriate orders placed.  FABIA FLEWELLING was informed that the remainder of the evaluation will be completed by another provider, this initial triage assessment does not replace that evaluation, and the importance of remaining in the ED until their evaluation is complete.   Carmin Muskrat, MD 09/22/22 1044

## 2022-09-26 LAB — URINE CULTURE: Culture: >=100000 — AB

## 2022-09-27 ENCOUNTER — Telehealth (HOSPITAL_BASED_OUTPATIENT_CLINIC_OR_DEPARTMENT_OTHER): Payer: Self-pay | Admitting: *Deleted

## 2022-09-27 NOTE — Telephone Encounter (Signed)
Post ED Visit - Positive Culture Follow-up  Culture report reviewed by antimicrobial stewardship pharmacist: [facility] Team '[]'$  Elenor Quinones, Pharm.D. '[]'$  Heide Guile, Pharm.D., BCPS AQ-ID '[]'$  Parks Neptune, Pharm.D., BCPS '[]'$  Alycia Rossetti, Pharm.D., BCPS '[]'$  [place], Pharm.D., BCPS, AAHIVP '[]'$  Legrand Como, Pharm.D., BCPS, AAHIVP '[]'$  Salome Arnt, PharmD, BCPS '[]'$  Johnnette Gourd, PharmD, BCPS '[]'$  Hughes Better, PharmD, BCPS '[]'$  Leeroy Cha, PharmD '[]'$  Laqueta Linden, PharmD, BCPS '[x]'$  Idamae Schuller, PharmD  [place] Team '[]'$  Leodis Sias, PharmD '[]'$  Lindell Spar, PharmD '[]'$  Royetta Asal, PharmD '[]'$  Graylin Shiver, Rph '[]'$  Rema Fendt) Glennon Mac, PharmD '[]'$  Arlyn Dunning, PharmD '[]'$  Netta Cedars, PharmD '[]'$  Dia Sitter, PharmD '[]'$  Leone Haven, PharmD '[]'$  Gretta Arab, PharmD '[]'$  Theodis Shove, PharmD '[]'$  Peggyann Juba, PharmD '[]'$  Reuel Boom, PharmD   Positive urine culture Treated with Cephalexin, organism sensitive to the same and no further patient follow-up is required at this time.  Judy Ballard 09/27/2022, 11:06 AM

## 2022-10-03 ENCOUNTER — Observation Stay (HOSPITAL_COMMUNITY)
Admission: EM | Admit: 2022-10-03 | Discharge: 2022-10-05 | Disposition: A | Discharge status: Home / Self Care | Payer: Medicaid Other | Attending: General Surgery | Admitting: General Surgery

## 2022-10-03 ENCOUNTER — Other Ambulatory Visit: Payer: Self-pay

## 2022-10-03 DIAGNOSIS — R1013 Epigastric pain: Secondary | ICD-10-CM | POA: Diagnosis present

## 2022-10-03 DIAGNOSIS — K8012 Calculus of gallbladder with acute and chronic cholecystitis without obstruction: Secondary | ICD-10-CM | POA: Diagnosis not present

## 2022-10-03 DIAGNOSIS — R109 Unspecified abdominal pain: Secondary | ICD-10-CM | POA: Diagnosis not present

## 2022-10-03 DIAGNOSIS — K8020 Calculus of gallbladder without cholecystitis without obstruction: Secondary | ICD-10-CM | POA: Diagnosis present

## 2022-10-03 DIAGNOSIS — K760 Fatty (change of) liver, not elsewhere classified: Secondary | ICD-10-CM | POA: Diagnosis not present

## 2022-10-03 LAB — URINALYSIS, ROUTINE W REFLEX MICROSCOPIC: RBC / HPF: >50 RBC/hpf (ref 0–5)

## 2022-10-03 LAB — COMPREHENSIVE METABOLIC PANEL
ALT: 192 U/L — ABNORMAL HIGH (ref 0–44)
AST: 296 U/L — ABNORMAL HIGH (ref 15–41)
Albumin: 3.7 g/dL (ref 3.5–5.0)
Alkaline Phosphatase: 161 U/L — ABNORMAL HIGH (ref 38–126)
Anion gap: 15 (ref 5–15)
BUN: 7 mg/dL (ref 6–20)
CO2: 20 mmol/L — ABNORMAL LOW (ref 22–32)
Calcium: 9.1 mg/dL (ref 8.9–10.3)
Chloride: 103 mmol/L (ref 98–111)
Creatinine, Ser: 0.77 mg/dL (ref 0.44–1.00)
GFR, Estimated: >60 mL/min (ref >60)
Glucose, Bld: 109 mg/dL — ABNORMAL HIGH (ref 70–99)
Potassium: 3.3 mmol/L — ABNORMAL LOW (ref 3.5–5.1)
Sodium: 138 mmol/L (ref 135–145)
Total Bilirubin: 1 mg/dL (ref 0.3–1.2)
Total Protein: 7.3 g/dL (ref 6.5–8.1)

## 2022-10-03 LAB — LIPASE, BLOOD: Lipase: 30 U/L (ref 11–51)

## 2022-10-03 LAB — CBC
HCT: 36.5 % (ref 36.0–46.0)
Hemoglobin: 11.7 g/dL — ABNORMAL LOW (ref 12.0–15.0)
MCH: 27 pg (ref 26.0–34.0)
MCHC: 32.1 g/dL (ref 30.0–36.0)
MCV: 84.1 fL (ref 80.0–100.0)
Platelets: 238 10*3/uL (ref 150–400)
RBC: 4.34 MIL/uL (ref 3.87–5.11)
RDW: 14.1 % (ref 11.5–15.5)
WBC: 7.4 10*3/uL (ref 4.0–10.5)
nRBC: 0 % (ref 0.0–0.2)

## 2022-10-03 LAB — I-STAT BETA HCG BLOOD, ED (MC, WL, AP ONLY): I-stat hCG, quantitative: <5 m[IU]/mL (ref <5)

## 2022-10-03 NOTE — ED Triage Notes (Signed)
Patient reports pain across upper abdomen with emesis onset this morning , no diarrhea or fever .

## 2022-10-04 ENCOUNTER — Encounter (HOSPITAL_COMMUNITY)
Admission: EM | Disposition: A | Discharge status: Home / Self Care | Payer: Self-pay | Source: Home / Self Care | Attending: Emergency Medicine

## 2022-10-04 ENCOUNTER — Observation Stay (HOSPITAL_COMMUNITY): Payer: Medicaid Other | Admitting: Critical Care Medicine

## 2022-10-04 ENCOUNTER — Encounter (HOSPITAL_COMMUNITY): Payer: Self-pay | Admitting: Surgery

## 2022-10-04 ENCOUNTER — Observation Stay (HOSPITAL_BASED_OUTPATIENT_CLINIC_OR_DEPARTMENT_OTHER): Payer: Medicaid Other | Admitting: Critical Care Medicine

## 2022-10-04 ENCOUNTER — Emergency Department (HOSPITAL_COMMUNITY): Payer: Medicaid Other

## 2022-10-04 ENCOUNTER — Other Ambulatory Visit: Payer: Self-pay

## 2022-10-04 DIAGNOSIS — K760 Fatty (change of) liver, not elsewhere classified: Secondary | ICD-10-CM | POA: Diagnosis not present

## 2022-10-04 DIAGNOSIS — R1013 Epigastric pain: Secondary | ICD-10-CM | POA: Diagnosis not present

## 2022-10-04 DIAGNOSIS — K8020 Calculus of gallbladder without cholecystitis without obstruction: Secondary | ICD-10-CM | POA: Diagnosis not present

## 2022-10-04 DIAGNOSIS — K8010 Calculus of gallbladder with chronic cholecystitis without obstruction: Secondary | ICD-10-CM | POA: Diagnosis not present

## 2022-10-04 DIAGNOSIS — K819 Cholecystitis, unspecified: Secondary | ICD-10-CM

## 2022-10-04 DIAGNOSIS — R109 Unspecified abdominal pain: Secondary | ICD-10-CM | POA: Diagnosis not present

## 2022-10-04 HISTORY — DX: Calculus of gallbladder without cholecystitis without obstruction: K80.20

## 2022-10-04 HISTORY — PX: CHOLECYSTECTOMY: SHX55

## 2022-10-04 LAB — COMPREHENSIVE METABOLIC PANEL
ALT: 175 U/L — ABNORMAL HIGH (ref 0–44)
AST: 208 U/L — ABNORMAL HIGH (ref 15–41)
Albumin: 3.5 g/dL (ref 3.5–5.0)
Alkaline Phosphatase: 161 U/L — ABNORMAL HIGH (ref 38–126)
Anion gap: 11 (ref 5–15)
BUN: 6 mg/dL (ref 6–20)
CO2: 25 mmol/L (ref 22–32)
Calcium: 8.9 mg/dL (ref 8.9–10.3)
Chloride: 101 mmol/L (ref 98–111)
Creatinine, Ser: 0.66 mg/dL (ref 0.44–1.00)
GFR, Estimated: >60 mL/min (ref >60)
Glucose, Bld: 95 mg/dL (ref 70–99)
Potassium: 3.3 mmol/L — ABNORMAL LOW (ref 3.5–5.1)
Sodium: 137 mmol/L (ref 135–145)
Total Bilirubin: 1.1 mg/dL (ref 0.3–1.2)
Total Protein: 6.9 g/dL (ref 6.5–8.1)

## 2022-10-04 LAB — CBC
HCT: 34.1 % — ABNORMAL LOW (ref 36.0–46.0)
Hemoglobin: 11.2 g/dL — ABNORMAL LOW (ref 12.0–15.0)
MCH: 27.3 pg (ref 26.0–34.0)
MCHC: 32.8 g/dL (ref 30.0–36.0)
MCV: 83 fL (ref 80.0–100.0)
Platelets: 214 10*3/uL (ref 150–400)
RBC: 4.11 MIL/uL (ref 3.87–5.11)
RDW: 14 % (ref 11.5–15.5)
WBC: 6.9 10*3/uL (ref 4.0–10.5)
nRBC: 0 % (ref 0.0–0.2)

## 2022-10-04 LAB — HIV ANTIBODY (ROUTINE TESTING W REFLEX): HIV Screen 4th Generation wRfx: NONREACTIVE

## 2022-10-04 LAB — SURGICAL PCR SCREEN
MRSA, PCR: NEGATIVE
Staphylococcus aureus: POSITIVE — AB

## 2022-10-04 SURGERY — LAPAROSCOPIC CHOLECYSTECTOMY
Anesthesia: General | Site: Abdomen

## 2022-10-04 MED ORDER — PROPOFOL 10 MG/ML IV BOLUS
INTRAVENOUS | Status: AC
  Filled 2022-10-04: qty 20

## 2022-10-04 MED ORDER — MIDAZOLAM HCL 2 MG/2ML IJ SOLN
INTRAMUSCULAR | Status: AC
  Filled 2022-10-04: qty 2

## 2022-10-04 MED ORDER — MIDAZOLAM HCL 5 MG/5ML IJ SOLN
INTRAMUSCULAR | Status: DC | PRN
  Administered 2022-10-04: 2 mg via INTRAVENOUS

## 2022-10-04 MED ORDER — PROPOFOL 10 MG/ML IV BOLUS
INTRAVENOUS | Status: DC | PRN
  Administered 2022-10-04: 180 mg via INTRAVENOUS

## 2022-10-04 MED ORDER — DEXAMETHASONE SODIUM PHOSPHATE 10 MG/ML IJ SOLN
INTRAMUSCULAR | Status: DC | PRN
  Administered 2022-10-04: 4 mg via INTRAVENOUS

## 2022-10-04 MED ORDER — BUPIVACAINE HCL (PF) 0.25 % IJ SOLN
INTRAMUSCULAR | Status: AC
  Filled 2022-10-04: qty 30

## 2022-10-04 MED ORDER — HYDROMORPHONE HCL 1 MG/ML IJ SOLN
INTRAMUSCULAR | Status: AC
  Filled 2022-10-04: qty 1

## 2022-10-04 MED ORDER — HYDROMORPHONE HCL 1 MG/ML IJ SOLN
0.5000 mg | INTRAMUSCULAR | Status: DC | PRN
  Administered 2022-10-04: 0.5 mg via INTRAVENOUS
  Filled 2022-10-04: qty 0.5

## 2022-10-04 MED ORDER — LACTATED RINGERS IV SOLN: INTRAVENOUS | Status: DC

## 2022-10-04 MED ORDER — ONDANSETRON 4 MG PO TBDP: 4.0000 mg | ORAL_TABLET | Freq: Four times a day (QID) | ORAL | Status: DC | PRN

## 2022-10-04 MED ORDER — SUGAMMADEX SODIUM 200 MG/2ML IV SOLN
INTRAVENOUS | Status: DC | PRN
  Administered 2022-10-04: 400 mg via INTRAVENOUS

## 2022-10-04 MED ORDER — POTASSIUM CHLORIDE CRYS ER 20 MEQ PO TBCR
40.0000 meq | EXTENDED_RELEASE_TABLET | Freq: Once | ORAL | Status: AC
  Administered 2022-10-04: 40 meq via ORAL
  Filled 2022-10-04: qty 2

## 2022-10-04 MED ORDER — ROCURONIUM BROMIDE 10 MG/ML (PF) SYRINGE
PREFILLED_SYRINGE | INTRAVENOUS | Status: DC | PRN
  Administered 2022-10-04: 90 mg via INTRAVENOUS

## 2022-10-04 MED ORDER — FENTANYL CITRATE (PF) 250 MCG/5ML IJ SOLN
INTRAMUSCULAR | Status: AC
  Filled 2022-10-04: qty 5

## 2022-10-04 MED ORDER — HYDROMORPHONE HCL 1 MG/ML IJ SOLN: 0.5000 mg | INTRAMUSCULAR | Status: DC | PRN

## 2022-10-04 MED ORDER — CHLORHEXIDINE GLUCONATE 0.12 % MT SOLN
OROMUCOSAL | Status: AC
  Administered 2022-10-04: 15 mL via OROMUCOSAL
  Filled 2022-10-04: qty 15

## 2022-10-04 MED ORDER — CHLORHEXIDINE GLUCONATE 0.12 % MT SOLN: 15.0000 mL | Freq: Once | OROMUCOSAL | Status: AC

## 2022-10-04 MED ORDER — SODIUM CHLORIDE 0.9 % IV SOLN
2.0000 g | Freq: Every day | INTRAVENOUS | Status: DC
  Filled 2022-10-04: qty 20

## 2022-10-04 MED ORDER — OXYCODONE HCL 5 MG PO TABS: 5.0000 mg | ORAL_TABLET | Freq: Once | ORAL | Status: DC | PRN

## 2022-10-04 MED ORDER — FENTANYL CITRATE PF 50 MCG/ML IJ SOSY
50.0000 ug | PREFILLED_SYRINGE | Freq: Once | INTRAMUSCULAR | Status: AC
  Administered 2022-10-04: 50 ug via INTRAVENOUS
  Filled 2022-10-04: qty 1

## 2022-10-04 MED ORDER — OXYCODONE HCL 5 MG/5ML PO SOLN: 5.0000 mg | Freq: Once | ORAL | Status: DC | PRN

## 2022-10-04 MED ORDER — FENTANYL CITRATE (PF) 250 MCG/5ML IJ SOLN
INTRAMUSCULAR | Status: DC | PRN
  Administered 2022-10-04 (×4): 50 ug via INTRAVENOUS

## 2022-10-04 MED ORDER — ONDANSETRON HCL 4 MG/2ML IJ SOLN
4.0000 mg | Freq: Once | INTRAMUSCULAR | Status: AC
  Administered 2022-10-04: 4 mg via INTRAVENOUS
  Filled 2022-10-04: qty 2

## 2022-10-04 MED ORDER — ACETAMINOPHEN 500 MG PO TABS
1000.0000 mg | ORAL_TABLET | Freq: Four times a day (QID) | ORAL | Status: DC
  Administered 2022-10-04 – 2022-10-05 (×4): 1000 mg via ORAL
  Filled 2022-10-04 (×4): qty 2

## 2022-10-04 MED ORDER — DIPHENHYDRAMINE HCL 50 MG/ML IJ SOLN: 25.0000 mg | Freq: Four times a day (QID) | INTRAMUSCULAR | Status: DC | PRN

## 2022-10-04 MED ORDER — CEFAZOLIN SODIUM-DEXTROSE 2-4 GM/100ML-% IV SOLN
2.0000 g | INTRAVENOUS | Status: AC
  Administered 2022-10-04: 2 g via INTRAVENOUS
  Filled 2022-10-04: qty 100

## 2022-10-04 MED ORDER — ORAL CARE MOUTH RINSE: 15.0000 mL | Freq: Once | OROMUCOSAL | Status: AC

## 2022-10-04 MED ORDER — AMISULPRIDE (ANTIEMETIC) 5 MG/2ML IV SOLN: 10.0000 mg | Freq: Once | INTRAVENOUS | Status: DC | PRN

## 2022-10-04 MED ORDER — ONDANSETRON HCL 4 MG/2ML IJ SOLN
INTRAMUSCULAR | Status: DC | PRN
  Administered 2022-10-04: 4 mg via INTRAVENOUS

## 2022-10-04 MED ORDER — MEPERIDINE HCL 25 MG/ML IJ SOLN: 6.2500 mg | INTRAMUSCULAR | Status: DC | PRN

## 2022-10-04 MED ORDER — PROCHLORPERAZINE EDISYLATE 10 MG/2ML IJ SOLN
10.0000 mg | Freq: Four times a day (QID) | INTRAMUSCULAR | Status: DC | PRN
  Administered 2022-10-04: 10 mg via INTRAVENOUS
  Filled 2022-10-04: qty 2

## 2022-10-04 MED ORDER — BUPIVACAINE-EPINEPHRINE 0.25% -1:200000 IJ SOLN
INTRAMUSCULAR | Status: DC | PRN
  Administered 2022-10-04: 17 mL

## 2022-10-04 MED ORDER — OXYCODONE HCL 5 MG PO TABS
5.0000 mg | ORAL_TABLET | ORAL | Status: DC | PRN
  Administered 2022-10-04: 10 mg via ORAL
  Filled 2022-10-04: qty 2

## 2022-10-04 MED ORDER — SODIUM CHLORIDE 0.9 % IR SOLN
Status: DC | PRN
  Administered 2022-10-04: 1000 mL

## 2022-10-04 MED ORDER — 0.9 % SODIUM CHLORIDE (POUR BTL) OPTIME
TOPICAL | Status: DC | PRN
  Administered 2022-10-04: 1000 mL

## 2022-10-04 MED ORDER — LIDOCAINE 2% (20 MG/ML) 5 ML SYRINGE
INTRAMUSCULAR | Status: DC | PRN
  Administered 2022-10-04: 100 mg via INTRAVENOUS

## 2022-10-04 MED ORDER — HYDROMORPHONE HCL 1 MG/ML IJ SOLN
0.2500 mg | INTRAMUSCULAR | Status: DC | PRN
  Administered 2022-10-04: 0.25 mg via INTRAVENOUS

## 2022-10-04 MED ORDER — ENOXAPARIN SODIUM 40 MG/0.4ML IJ SOSY
40.0000 mg | PREFILLED_SYRINGE | INTRAMUSCULAR | Status: DC
  Administered 2022-10-04: 40 mg via SUBCUTANEOUS
  Filled 2022-10-04: qty 0.4

## 2022-10-04 MED ORDER — DIPHENHYDRAMINE HCL 25 MG PO CAPS: 25.0000 mg | ORAL_CAPSULE | Freq: Four times a day (QID) | ORAL | Status: DC | PRN

## 2022-10-04 MED ORDER — CHLORHEXIDINE GLUCONATE CLOTH 2 % EX PADS: 6.0000 | MEDICATED_PAD | Freq: Once | CUTANEOUS | Status: DC

## 2022-10-04 MED ORDER — ONDANSETRON HCL 4 MG/2ML IJ SOLN
4.0000 mg | Freq: Four times a day (QID) | INTRAMUSCULAR | Status: DC | PRN
  Administered 2022-10-04 (×2): 4 mg via INTRAVENOUS
  Filled 2022-10-04 (×2): qty 2

## 2022-10-04 MED ORDER — PROMETHAZINE HCL 25 MG/ML IJ SOLN: 6.2500 mg | INTRAMUSCULAR | Status: DC | PRN

## 2022-10-04 SURGICAL SUPPLY — 43 items
APPLIER CLIP 5 13 M/L LIGAMAX5 (MISCELLANEOUS) ×1
BAG COUNTER SPONGE SURGICOUNT (BAG) ×1 IMPLANT
BLADE CLIPPER SURG (BLADE) IMPLANT
CANISTER SUCT 3000ML PPV (MISCELLANEOUS) ×1 IMPLANT
CHLORAPREP W/TINT 26 (MISCELLANEOUS) ×1 IMPLANT
CLIP APPLIE 5 13 M/L LIGAMAX5 (MISCELLANEOUS) ×1 IMPLANT
CNTNR URN SCR LID CUP LEK RST (MISCELLANEOUS) IMPLANT
CONT SPEC 4OZ STRL OR WHT (MISCELLANEOUS) ×1
COVER SURGICAL LIGHT HANDLE (MISCELLANEOUS) ×1 IMPLANT
DERMABOND ADVANCED .7 DNX12 (GAUZE/BANDAGES/DRESSINGS) ×1 IMPLANT
ELECT REM PT RETURN 9FT ADLT (ELECTROSURGICAL) ×1
ELECTRODE REM PT RTRN 9FT ADLT (ELECTROSURGICAL) ×1 IMPLANT
GLOVE BIO SURGEON STRL SZ8 (GLOVE) ×1 IMPLANT
GLOVE BIOGEL PI IND STRL 8 (GLOVE) ×1 IMPLANT
GOWN STRL REUS W/ TWL LRG LVL3 (GOWN DISPOSABLE) ×2 IMPLANT
GOWN STRL REUS W/ TWL XL LVL3 (GOWN DISPOSABLE) ×1 IMPLANT
GOWN STRL REUS W/TWL LRG LVL3 (GOWN DISPOSABLE) ×2
GOWN STRL REUS W/TWL XL LVL3 (GOWN DISPOSABLE) ×1
IRRIG SUCT STRYKERFLOW 2 WTIP (MISCELLANEOUS) ×1
IRRIGATION SUCT STRKRFLW 2 WTP (MISCELLANEOUS) ×1 IMPLANT
KIT BASIN OR (CUSTOM PROCEDURE TRAY) ×1 IMPLANT
KIT TURNOVER KIT B (KITS) ×1 IMPLANT
L-HOOK LAP DISP 36CM (ELECTROSURGICAL) ×1
LHOOK LAP DISP 36CM (ELECTROSURGICAL) ×1 IMPLANT
NDL 22X1.5 STRL (OR ONLY) (MISCELLANEOUS) ×1 IMPLANT
NEEDLE 22X1.5 STRL (OR ONLY) (MISCELLANEOUS) ×1 IMPLANT
NS IRRIG 1000ML POUR BTL (IV SOLUTION) ×1 IMPLANT
PAD ARMBOARD 7.5X6 YLW CONV (MISCELLANEOUS) ×1 IMPLANT
PENCIL BUTTON HOLSTER BLD 10FT (ELECTRODE) ×1 IMPLANT
POUCH RETRIEVAL ECOSAC 10 (ENDOMECHANICALS) ×1 IMPLANT
POUCH RETRIEVAL ECOSAC 10MM (ENDOMECHANICALS) ×1
SCISSORS LAP 5X35 DISP (ENDOMECHANICALS) ×1 IMPLANT
SET TUBE SMOKE EVAC HIGH FLOW (TUBING) ×1 IMPLANT
SLEEVE Z-THREAD 5X100MM (TROCAR) ×2 IMPLANT
SPECIMEN JAR SMALL (MISCELLANEOUS) ×1 IMPLANT
SUT VIC AB 4-0 PS2 27 (SUTURE) ×1 IMPLANT
TOWEL GREEN STERILE (TOWEL DISPOSABLE) ×1 IMPLANT
TOWEL GREEN STERILE FF (TOWEL DISPOSABLE) ×1 IMPLANT
TRAY LAPAROSCOPIC MC (CUSTOM PROCEDURE TRAY) ×1 IMPLANT
TROCAR BALLN 12MMX100 BLUNT (TROCAR) ×1 IMPLANT
TROCAR Z-THREAD OPTICAL 5X100M (TROCAR) ×1 IMPLANT
WARMER LAPAROSCOPE (MISCELLANEOUS) ×1 IMPLANT
WATER STERILE IRR 1000ML POUR (IV SOLUTION) ×1 IMPLANT

## 2022-10-04 NOTE — Op Note (Signed)
  10/04/2022  9:37 AM  PATIENT:  Judy Ballard  22 y.o. female  PRE-OPERATIVE DIAGNOSIS:  Cholesytitis  POST-OPERATIVE DIAGNOSIS:  Cholesytitis  PROCEDURE:  Procedure(s): LAPAROSCOPIC CHOLECYSTECTOMY  SURGEON:  Surgeon(s): Georganna Skeans, MD  ASSISTANTS: none   ANESTHESIA:   local and general  EBL:  Total I/O In: 750 [I.V.:650; IV Piggyback:100] Out: 320 [Urine:300; Blood:20]  BLOOD ADMINISTERED:none  DRAINS: none   SPECIMEN:  Excision  DISPOSITION OF SPECIMEN:  PATHOLOGY  COUNTS:  YES  DICTATION: .Dragon Dictation Procedure detail: Informed consent was obtained.  She was given intravenous antibiotics.  She was brought to the operating room and general endotracheal anesthesia was administered by the anesthesia staff.  Her abdomen was prepped and draped in a sterile fashion.  We did a timeout procedure.The infraumbilical region was infiltrated with local. Infraumbilical incision was made along her previous scar. Subcutaneous tissues were dissected down revealing the anterior fascia. This was divided sharply along the midline. Peritoneal cavity was entered under direct vision without complication. A 0 Vicryl pursestring was placed around the fascial opening. Hassan trocar was inserted into the abdomen. The abdomen was insufflated with carbon dioxide in standard fashion. Under direct vision a 5 mm epigastric and a 5 mm right abdominal port x 2 were placed.  Local was used at each port site position laparoscopic exploration of the right acute on chronic inflammation of the gallbladder with a lot of omental adhesions.  These were gradually taken down using cautery.  The dome of the gallbladder was retracted superior and medially.  Further omental adhesions were taken down from the body and infundibulum.  The infundibulum was retracted inferiorly and laterally.  Dissection began laterally and progressed medially identifying the cystic duct followed by the cystic artery.  The cystic  duct was dissected until we had a good critical view.  There was a lot of inflammation and bilirubin was normal so I did not do a cholangiogram.  3 clips were placed proximally on the cystic duct and 1 was placed distally.  It was divided.  The cystic artery was clipped twice proximally, once distally and divided.  The gallbladder was taken off the liver bed using Bovie cautery.  It was placed in a bag and taken out of the abdomen.  It was sent to pathology.  The liver bed was cauterized to get excellent hemostasis.  The area was irrigated.  Irrigation fluid was evacuated.  The liver bed was dry.  Clips remain in good position.  Four-quadrant inspection revealed no complications.  Ports were removed under direct vision.  Pneumoperitoneum was released.  Infraumbilical fascia was closed by tying the pursestring.  All 4 wounds were irrigated and the skin of each was closed with 4-0 Vicryl followed by Dermabond.  All counts were correct.  She tolerated the procedure well without apparent complication was taken recovery in stable condition.  PATIENT DISPOSITION:  PACU - hemodynamically stable.   Delay start of Pharmacological VTE agent (>24hrs) due to surgical blood loss or risk of bleeding:  no  Georganna Skeans, MD, MPH, FACS Pager: [phone]  3/11/20249:37 AM

## 2022-10-04 NOTE — Transfer of Care (Signed)
Immediate Anesthesia Transfer of Care Note  Patient: Judy Ballard  Procedure(s) Performed: LAPAROSCOPIC CHOLECYSTECTOMY (Abdomen)  Patient Location: PACU  Anesthesia Type:General  Level of Consciousness: awake and alert   Airway & Oxygen Therapy: Patient Spontanous Breathing and Patient connected to nasal cannula oxygen  Post-op Assessment: Report given to RN and Post -op Vital signs reviewed and stable  Post vital signs: Reviewed and stable  Last Vitals:  Vitals Value Taken Time  BP 130/84 10/04/22 0945  Temp    Pulse 81 10/04/22 0946  Resp 22 10/04/22 0946  SpO2 100 % 10/04/22 0946  Vitals shown include unvalidated device data.  Last Pain:  Vitals:   10/04/22 0757  TempSrc: Oral  PainSc:          Complications: No notable events documented.

## 2022-10-04 NOTE — Plan of Care (Signed)
Patient AxOx4, pleasant, after lunch had increased nausea that resolved with Compazine. She also reported severe abdominal pain that was successfully managed with PRN Dilaudid and Oxycodone. Patient voided to bathroom and ambulated with stand-by assist on the hallway. VS WNL. Doesn't pass gas and abdomen still hypoactive and tender. Incision sites WNL, closed with skin glue and without swelling or drainage noted. SCDs on and personal belongings within reach. Hourly rounds performed and patient educated on post-procedure care plan.

## 2022-10-04 NOTE — Progress Notes (Cosign Needed)
  Transition of Care [facility]) Screening Note   Patient Details  Name: Judy Ballard Date of Birth: 07-11-2001   Transition of Care [facility]) CM/SW Contact:    Cyndi Bender, RN Phone Number: 10/04/2022, 11:52 AM    Transition of Care Department [facility]) has reviewed patient and no TOC needs have been identified at this time. We will continue to monitor patient advancement through interdisciplinary progression rounds. If new patient transition needs arise, please place a TOC consult.

## 2022-10-04 NOTE — Anesthesia Preprocedure Evaluation (Signed)
Anesthesia Evaluation  Patient identified by MRN, date of birth, ID band Patient awake    Reviewed: Allergy & Precautions, Patient's Chart, lab work & pertinent test results  Airway Mallampati: II  TM Distance: >3 FB Neck ROM: Full    Dental no notable dental hx.    Pulmonary neg pulmonary ROS   Pulmonary exam normal breath sounds clear to auscultation       Cardiovascular hypertension, Pt. on medications Normal cardiovascular exam Rhythm:Regular Rate:Normal     Neuro/Psych    Depression    negative neurological ROS  negative psych ROS   GI/Hepatic Neg liver ROS,GERD  Medicated and Controlled,,  Endo/Other  negative endocrine ROS    Renal/GU negative Renal ROS  negative genitourinary   Musculoskeletal negative musculoskeletal ROS (+)    Abdominal  (+) + obese  Peds  Hematology  (+) Blood dyscrasia, anemia hct 29, plt 441   Anesthesia Other Findings   Reproductive/Obstetrics                             Anesthesia Physical Anesthesia Plan  ASA: II  Anesthesia Plan: General   Post-op Pain Management: Dilaudid IV   Induction: Intravenous  PONV Risk Score and Plan: 3 and Ondansetron, Dexamethasone, Midazolam and Treatment may vary due to age or medical condition  Airway Management Planned: Oral ETT  Additional Equipment: None  Intra-op Plan:   Post-operative Plan: Extubation in OR  Informed Consent: I have reviewed the patients History and Physical, chart, labs and discussed the procedure including the risks, benefits and alternatives for the proposed anesthesia with the patient or authorized representative who has indicated his/her understanding and acceptance.       Plan Discussed with:   Anesthesia Plan Comments:         Anesthesia Quick Evaluation

## 2022-10-04 NOTE — Anesthesia Procedure Notes (Signed)
Procedure Name: Intubation Date/Time: 10/04/2022 8:28 AM  Performed by: Wilburn Cornelia, CRNAPre-anesthesia Checklist: Patient identified, Emergency Drugs available, Suction available, Patient being monitored and Timeout performed Patient Re-evaluated:Patient Re-evaluated prior to induction Oxygen Delivery Method: Circle system utilized Preoxygenation: Pre-oxygenation with 100% oxygen Induction Type: IV induction Ventilation: Mask ventilation without difficulty Laryngoscope Size: Mac and 3 Grade View: Grade I Tube type: Oral Tube size: 7.0 mm Number of attempts: 1 Airway Equipment and Method: Stylet Placement Confirmation: positive ETCO2, CO2 detector, breath sounds checked- equal and bilateral and ETT inserted through vocal cords under direct vision Secured at: 21 cm Tube secured with: Tape Dental Injury: Teeth and Oropharynx as per pre-operative assessment

## 2022-10-04 NOTE — Discharge Instructions (Signed)
[facility]  Please arrive at least 30 min before your appointment to complete your check in paperwork.  If you are unable to arrive 30 min prior to your appointment time we may have to cancel or reschedule you. LAPAROSCOPIC SURGERY: POST OP INSTRUCTIONS Always review your discharge instruction sheet given to you by the facility where your surgery was performed. IF YOU HAVE DISABILITY OR FAMILY LEAVE FORMS, YOU MUST BRING THEM TO THE OFFICE FOR PROCESSING.   DO NOT GIVE THEM TO YOUR DOCTOR.  PAIN CONTROL  First take acetaminophen (Tylenol) AND/or ibuprofen (Advil) to control your pain after surgery.  Follow directions on package.  Taking acetaminophen (Tylenol) and/or ibuprofen (Advil) regularly after surgery will help to control your pain and lower the amount of prescription pain medication you may need.  You should not take more than 4,000 mg (4 grams) of acetaminophen (Tylenol) in 24 hours.  You should not take ibuprofen (Advil), aleve, motrin, naprosyn or other NSAIDS if you have a history of stomach ulcers or chronic kidney disease.  A prescription for pain medication may be given to you upon discharge.  Take your pain medication as prescribed, if you still have uncontrolled pain after taking acetaminophen (Tylenol) or ibuprofen (Advil). Use ice packs to help control pain. If you need a refill on your pain medication, please contact your pharmacy.  They will contact our office to request authorization. Prescriptions will not be filled after 5pm or on week-ends.  HOME MEDICATIONS Take your usually prescribed medications unless otherwise directed.  DIET You should follow a light diet the first few days after arrival home.  Be sure to include lots of fluids daily. Avoid fatty, fried foods.   CONSTIPATION It is common to experience some constipation after surgery and if you are taking pain medication.  Increasing fluid intake and taking a stool softener (such as Colace)  will usually help or prevent this problem from occurring.  A mild laxative (Milk of Magnesia or Miralax) should be taken according to package instructions if there are no bowel movements after 48 hours.  WOUND/INCISION CARE Most patients will experience some swelling and bruising in the area of the incisions.  Ice packs will help.  Swelling and bruising can take several days to resolve.  Unless discharge instructions indicate otherwise, follow guidelines below  STERI-STRIPS - you may remove your outer bandages 48 hours after surgery, and you may shower at that time.  You have steri-strips (small skin tapes) in place directly over the incision.  These strips should be left on the skin for 7-10 days.   DERMABOND/SKIN GLUE - you may shower in 24 hours.  The glue will flake off over the next 2-3 weeks. Any sutures or staples will be removed at the office during your follow-up visit.  ACTIVITIES You may resume regular (light) daily activities beginning the next day--such as daily self-care, walking, climbing stairs--gradually increasing activities as tolerated.  You may have sexual intercourse when it is comfortable.  Refrain from any heavy lifting or straining until approved by your doctor. You may drive when you are no longer taking prescription pain medication, you can comfortably wear a seatbelt, and you can safely maneuver your car and apply brakes.  FOLLOW-UP You should see your doctor in the office for a follow-up appointment approximately 2-3 weeks after your surgery.  You should have been given your post-op/follow-up appointment when your surgery was scheduled.  If you did not receive a post-op/follow-up appointment, make sure   that you call for this appointment within a day or two after you arrive home to insure a convenient appointment time.  OTHER INSTRUCTIONS  WHEN TO CALL YOUR DOCTOR: Fever over 101.0 Inability to urinate Continued bleeding from incision. Increased pain, redness, or  drainage from the incision. Increasing abdominal pain  The clinic staff is available to answer your questions during regular business hours.  Please don't hesitate to call and ask to speak to one of the nurses for clinical concerns.  If you have a medical emergency, go to the nearest emergency room or call 911.  A surgeon from [facility] is always on call at the hospital. [address], [address], [place], [place]  [address] ? [address] [address], [place], [place]   [address] [phone] ? [phone] ? FAX [phone]   

## 2022-10-04 NOTE — ED Notes (Signed)
ED TO INPATIENT HANDOFF REPORT  ED Nurse Name and Phone #: [phone]  S Name/Age/Gender Judy Ballard 22 y.o. female Room/Bed: H011C/H011C  Code Status   Code Status: Full Code  Home/SNF/Other Home Patient oriented to: self, place, time, and situation Is this baseline? Yes   Triage Complete: Triage complete  Chief Complaint Cholelithiasis [K80.20]  Triage Note Patient reports pain across upper abdomen with emesis onset this morning , no diarrhea or fever .    Allergies Allergies  Allergen Reactions   Apple Juice Hives    Swollen lips   Fish Allergy Hives    tilapia     Level of Care/Admitting Diagnosis ED Disposition     ED Disposition  Admit   Condition  --   Comment  Hospital Area: [facility] [[id number]]  Level of Care: Med-Surg [16]  May place patient in observation at [facility] or [facility] if equivalent level of care is available:: No  Covid Evaluation: Asymptomatic - no recent exposure (last 10 days) testing not required  Diagnosis: Cholelithiasis [id number]  Admitting Physician: Dwan Bolt [id number]  Attending Physician: [facility], MD [[id number]          B Medical/Surgery History Past Medical History:  Diagnosis Date   Right knee injury, initial encounter 08/10/2017   S/P tonsillectomy 07/16/2019   Tonsillitis    UTI (urinary tract infection)    Past Surgical History:  Procedure Laterality Date   CESAREAN SECTION N/A 10/05/2021   Procedure: CESAREAN SECTION;  Surgeon: Janyth Pupa, DO;  Location: [facility];  Service: Obstetrics;  Laterality: N/A;  Stat C/S for non reassuring fetal heart tracing   TONSILLECTOMY Bilateral 07/16/2019   Procedure: TONSILLECTOMY;  Surgeon: Izora Gala, MD;  Location: [facility];  Service: ENT;  Laterality: Bilateral;   UMBILICAL HERNIA REPAIR       A IV Location/Drains/Wounds Patient Lines/Drains/Airways Status     Active Line/Drains/Airways     Name Placement date  Placement time Site Days   Peripheral IV 10/04/22 22 G Right Antecubital 10/04/22  0044  Antecubital  less than 1            Intake/Output Last 24 hours No intake or output data in the 24 hours ending 10/04/22 0233  Labs/Imaging Results for orders placed or performed during the hospital encounter of 10/03/22 (from the past 48 hour(s))  Lipase, blood     Status: None   Collection Time: 10/03/22 11:07 PM  Result Value Ref Range   Lipase 30 11 - 51 U/L    Comment: Performed at [facility], [address] [place], [place] [address]  Comprehensive metabolic panel     Status: Abnormal   Collection Time: 10/03/22 11:07 PM  Result Value Ref Range   Sodium 138 135 - 145 mmol/L   Potassium 3.3 (L) 3.5 - 5.1 mmol/L   Chloride 103 98 - 111 mmol/L   CO2 20 (L) 22 - 32 mmol/L   Glucose, Bld 109 (H) 70 - 99 mg/dL    Comment: Glucose reference range applies only to samples taken after fasting for at least 8 hours.   BUN 7 6 - 20 mg/dL   Creatinine, Ser 0.77 0.44 - 1.00 mg/dL   Calcium 9.1 8.9 - 10.3 mg/dL   Total Protein 7.3 6.5 - 8.1 g/dL   Albumin 3.7 3.5 - 5.0 g/dL   AST 296 (H) 15 - 41 U/L   ALT 192 (H) 0 - 44 U/L  Alkaline Phosphatase 161 (H) 38 - 126 U/L   Total Bilirubin 1.0 0.3 - 1.2 mg/dL   GFR, Estimated >60 >60 mL/min    Comment: (NOTE) Calculated using the CKD-EPI Creatinine Equation (2021)    Anion gap 15 5 - 15    Comment: Performed at [facility] [address] [place], [place] [address]  CBC     Status: Abnormal   Collection Time: 10/03/22 11:07 PM  Result Value Ref Range   WBC 7.4 4.0 - 10.5 K/uL   RBC 4.34 3.87 - 5.11 MIL/uL   Hemoglobin 11.7 (L) 12.0 - 15.0 g/dL   HCT 36.5 36.0 - 46.0 %   MCV 84.1 80.0 - 100.0 fL   MCH 27.0 26.0 - 34.0 pg   MCHC 32.1 30.0 - 36.0 g/dL   RDW 14.1 11.5 - 15.5 %   Platelets 238 150 - 400 K/uL    Comment: REPEATED TO VERIFY   nRBC 0.0 0.0 - 0.2 %    Comment: Performed at [facility], [place] [address], [place], [place] [address]  Urinalysis, Routine w reflex microscopic -Urine, Clean Catch     Status: Abnormal   Collection Time: 10/03/22 11:07 PM  Result Value Ref Range   Color, Urine RED (A) YELLOW   APPearance TURBID (A) CLEAR   Specific Gravity, Urine  1.005 - 1.030    TEST NOT REPORTED DUE TO COLOR INTERFERENCE OF URINE PIGMENT   pH  5.0 - 8.0    TEST NOT REPORTED DUE TO COLOR INTERFERENCE OF URINE PIGMENT   Glucose, UA (A) NEGATIVE mg/dL    TEST NOT REPORTED DUE TO COLOR INTERFERENCE OF URINE PIGMENT   Hgb urine dipstick (A) NEGATIVE    TEST NOT REPORTED DUE TO COLOR INTERFERENCE OF URINE PIGMENT   Bilirubin Urine (A) NEGATIVE    TEST NOT REPORTED DUE TO COLOR INTERFERENCE OF URINE PIGMENT   Ketones, ur (A) NEGATIVE mg/dL    TEST NOT REPORTED DUE TO COLOR INTERFERENCE OF URINE PIGMENT   Protein, ur (A) NEGATIVE mg/dL    TEST NOT REPORTED DUE TO COLOR INTERFERENCE OF URINE PIGMENT   Nitrite (A) NEGATIVE    TEST NOT REPORTED DUE TO COLOR INTERFERENCE OF URINE PIGMENT   Leukocytes,Ua (A) NEGATIVE    TEST NOT REPORTED DUE TO COLOR INTERFERENCE OF URINE PIGMENT   RBC / HPF >50 0 - 5 RBC/hpf   WBC, UA 0-5 0 - 5 WBC/hpf   Bacteria, UA RARE (A) NONE Ballard   Squamous Epithelial / HPF 0-5 0 - 5 /HPF   Mucus PRESENT     Comment: Performed at [facility], [place] [address], [place], [place] [address]  I-Stat beta hCG blood, ED     Status: None   Collection Time: 10/03/22 11:09 PM  Result Value Ref Range   I-stat hCG, quantitative <5.0 <5 mIU/mL   Comment 3            Comment:   GEST. AGE      CONC.  (mIU/mL)   <=1 WEEK        5 - 50     2 WEEKS       50 - 500     3 WEEKS       100 - 10,000     4 WEEKS     1,000 - 30,000        FEMALE AND NON-PREGNANT FEMALE:     LESS THAN 5  mIU/mL    US Abdomen Limited RUQ (LIVER/GB)  Result Date: 10/04/2022 CLINICAL DATA:  Abdominal pain. EXAM: ULTRASOUND ABDOMEN LIMITED RIGHT UPPER QUADRANT COMPARISON:  CT abdomen pelvis dated 09/22/2022  and ultrasound dated 03/13/2022. FINDINGS: Gallbladder: The gallbladder is filled with stones and not well visualized due to shadowing. No pericholecystic fluid. Negative sonographic Murphy's sign. Common bile duct: Diameter: 5 mm Liver: There is diffuse increased liver echogenicity most commonly Ballard in the setting of fatty infiltration. Superimposed inflammation or fibrosis is not excluded. Clinical correlation is recommended. Portal vein is patent on color Doppler imaging with normal direction of blood flow towards the liver. Other: None. IMPRESSION: 1. Cholelithiasis without sonographic evidence of acute cholecystitis. 2. Fatty liver. Electronically Signed   By: Anner Crete M.D.   On: 10/04/2022 01:56    Pending Labs Unresulted Labs (From admission, onward)     Start     Ordered   10/11/22 0500  Creatinine, serum  (enoxaparin (LOVENOX)    CrCl >/= 30 ml/min)  Weekly,   R     Comments: while on enoxaparin therapy    10/04/22 0216   10/04/22 0500  Comprehensive metabolic panel  Tomorrow morning,   R        10/04/22 0216   10/04/22 0214  HIV Antibody (routine testing w rflx)  (HIV Antibody (Routine testing w reflex) panel)  Once,   R        10/04/22 0216   10/04/22 0214  CBC  (enoxaparin (LOVENOX)    CrCl >/= 30 ml/min)  Once,   R       Comments: Baseline for enoxaparin therapy IF NOT ALREADY DRAWN.  Notify MD if PLT < 100 K.    10/04/22 0216   10/04/22 0214  Creatinine, serum  (enoxaparin (LOVENOX)    CrCl >/= 30 ml/min)  Once,   R       Comments: Baseline for enoxaparin therapy IF NOT ALREADY DRAWN.    10/04/22 0216            Vitals/Pain Today's Vitals   10/03/22 2301 10/03/22 2303 10/04/22 0119 10/04/22 0142  BP: (!) 130/90   (!) 144/77  Pulse: 84   69  Resp: 18   20  Temp: 98.3 F (36.8 C)   98.6 F (37 C)  TempSrc: Oral   Oral  SpO2: 100%   100%  PainSc:  7  4      Isolation Precautions No active isolations  Medications Medications  enoxaparin (LOVENOX)  injection 40 mg (has no administration in time range)  lactated ringers infusion (has no administration in time range)  HYDROmorphone (DILAUDID) injection 0.5 mg (has no administration in time range)  diphenhydrAMINE (BENADRYL) capsule 25 mg (has no administration in time range)    Or  diphenhydrAMINE (BENADRYL) injection 25 mg (has no administration in time range)  ondansetron (ZOFRAN-ODT) disintegrating tablet 4 mg (has no administration in time range)    Or  ondansetron (ZOFRAN) injection 4 mg (has no administration in time range)  fentaNYL (SUBLIMAZE) injection 50 mcg (50 mcg Intravenous Given 10/04/22 0047)  ondansetron (ZOFRAN) injection 4 mg (4 mg Intravenous Given 10/04/22 0047)    Mobility walks     Focused Assessments Upper abdominal tenderness and pain, nausea. Relieved by meds   R Recommendations: See Admitting Provider Note  Report given to:   Additional Notes: none

## 2022-10-04 NOTE — ED Notes (Signed)
Patient educated about not driving or performing other critical tasks (such as operating heavy machinery, caring for infant/toddler/child) due to sedative nature of narcotic medications received while in the ED.  Pt/caregiver verbalized understanding.   

## 2022-10-04 NOTE — Anesthesia Postprocedure Evaluation (Signed)
Anesthesia Post Note  Patient: Judy Ballard  Procedure(s) Performed: LAPAROSCOPIC CHOLECYSTECTOMY (Abdomen)     Patient location during evaluation: PACU Anesthesia Type: General Level of consciousness: awake and alert Pain management: pain level controlled Vital Signs Assessment: post-procedure vital signs reviewed and stable Respiratory status: spontaneous breathing, nonlabored ventilation and respiratory function stable Cardiovascular status: blood pressure returned to baseline and stable Postop Assessment: no apparent nausea or vomiting Anesthetic complications: no   No notable events documented.  Last Vitals:  Vitals:   10/04/22 1054 10/04/22 1102  BP: 122/79 120/79  Pulse: 82 84  Resp: 20   Temp: 36.9 C   SpO2: 97% 98%    Last Pain:  Vitals:   10/04/22 1157  TempSrc:   PainSc: 10-Worst pain ever                 Lynda Rainwater

## 2022-10-04 NOTE — Plan of Care (Signed)
  Problem: Education: Goal: Knowledge of General Education information will improve Description: Including pain rating scale, medication(s)/side effects and non-pharmacologic comfort measures Outcome: Progressing   Problem: Health Behavior/Discharge Planning: Goal: Ability to manage health-related needs will improve Outcome: Progressing   Problem: Nutrition: Goal: Adequate nutrition will be maintained Outcome: Progressing   Problem: Elimination: Goal: Will not experience complications related to bowel motility Outcome: Progressing Goal: Will not experience complications related to urinary retention Outcome: Progressing   Problem: Pain Managment: Goal: General experience of comfort will improve Outcome: Progressing   Problem: Safety: Goal: Ability to remain free from injury will improve Outcome: Progressing   Problem: Skin Integrity: Goal: Risk for impaired skin integrity will decrease Outcome: Progressing   

## 2022-10-04 NOTE — ED Provider Notes (Signed)
[facility] Provider Note   CSN: [id number] Arrival date & time: 10/03/22  2256     History  Chief Complaint  Patient presents with   Abdominal Pain    Judy Ballard is a 22 y.o. female.  The history is provided by the patient and medical records.  Abdominal Pain Associated symptoms: nausea and vomiting    22 y.o. F with hx of anemia, depression, gallstones, presenting to the ED for epigastric pain.  States has been ongoing intermittently for a few weeks now, seeming to get worse.  Pain along epigastric and RUQ area.  She does notice pain is worse after eating.  Some occasional nausea/vomiting.  No diarrhea.  No fever.  No chest pain or SOB.  Does have known hx of gallstones diagnosed during pregnancy, never saw surgeon as was not having a lot of issues.  No meds taken PTA.  Home Medications Prior to Admission medications   Medication Sig Start Date End Date Taking? Authorizing Provider  acetaminophen (TYLENOL) 500 MG tablet Take 2 tablets (1,000 mg total) by mouth every 4 (four) hours as needed for mild pain (temperature > 101.5.). Patient not taking: Reported on 03/13/2022 10/07/21   Gavin Pound, CNM  cephALEXin (KEFLEX) 500 MG capsule Take 1 capsule (500 mg total) by mouth 4 (four) times daily. 09/22/22   Carlisle Cater, PA-C  famotidine (PEPCID) 20 MG tablet Take 1 tablet (20 mg total) by mouth 2 (two) times daily. 09/22/22   Carlisle Cater, PA-C  naproxen (NAPROSYN) 500 MG tablet Take 1 tablet (500 mg total) by mouth 2 (two) times daily with a meal. Patient not taking: Reported on 03/13/2022 10/28/21   Vanessa Kick, MD  ondansetron (ZOFRAN-ODT) 4 MG disintegrating tablet Take 1 tablet (4 mg total) by mouth every 8 (eight) hours as needed for nausea or vomiting. 09/22/22   Carlisle Cater, PA-C      Allergies    Apple juice and Fish allergy    Review of Systems   Review of Systems  Gastrointestinal:  Positive for abdominal pain,  nausea and vomiting.  All other systems reviewed and are negative.   Physical Exam Updated Vital Signs BP (!) 130/90 (BP Location: Right Arm)   Pulse 84   Temp 98.3 F (36.8 C) (Oral)   Resp 18   SpO2 100%   Physical Exam Vitals and nursing note reviewed.  Constitutional:      Appearance: She is well-developed.  HENT:     Head: Normocephalic and atraumatic.  Eyes:     Conjunctiva/sclera: Conjunctivae normal.     Pupils: Pupils are equal, round, and reactive to light.  Cardiovascular:     Rate and Rhythm: Normal rate and regular rhythm.     Heart sounds: Normal heart sounds.  Pulmonary:     Effort: Pulmonary effort is normal.     Breath sounds: Normal breath sounds.  Abdominal:     General: Bowel sounds are normal.     Palpations: Abdomen is soft.     Tenderness: There is abdominal tenderness in the epigastric area.     Comments: No murphy's sign  Musculoskeletal:        General: Normal range of motion.     Cervical back: Normal range of motion.  Skin:    General: Skin is warm and dry.  Neurological:     Mental Status: She is alert and oriented to person, place, and time.     ED Results /  Procedures / Treatments   Labs (all labs ordered are listed, but only abnormal results are displayed) Labs Reviewed  COMPREHENSIVE METABOLIC PANEL - Abnormal; Notable for the following components:      Result Value   Potassium 3.3 (*)    CO2 20 (*)    Glucose, Bld 109 (*)    AST 296 (*)    ALT 192 (*)    Alkaline Phosphatase 161 (*)    All other components within normal limits  CBC - Abnormal; Notable for the following components:   Hemoglobin 11.7 (*)    All other components within normal limits  URINALYSIS, ROUTINE W REFLEX MICROSCOPIC - Abnormal; Notable for the following components:   Color, Urine RED (*)    APPearance TURBID (*)    Glucose, UA   (*)    Value: TEST NOT REPORTED DUE TO COLOR INTERFERENCE OF URINE PIGMENT   Hgb urine dipstick   (*)    Value: TEST NOT  REPORTED DUE TO COLOR INTERFERENCE OF URINE PIGMENT   Bilirubin Urine   (*)    Value: TEST NOT REPORTED DUE TO COLOR INTERFERENCE OF URINE PIGMENT   Ketones, ur   (*)    Value: TEST NOT REPORTED DUE TO COLOR INTERFERENCE OF URINE PIGMENT   Protein, ur   (*)    Value: TEST NOT REPORTED DUE TO COLOR INTERFERENCE OF URINE PIGMENT   Nitrite   (*)    Value: TEST NOT REPORTED DUE TO COLOR INTERFERENCE OF URINE PIGMENT   Leukocytes,Ua   (*)    Value: TEST NOT REPORTED DUE TO COLOR INTERFERENCE OF URINE PIGMENT   Bacteria, UA RARE (*)    All other components within normal limits  LIPASE, BLOOD  I-STAT BETA HCG BLOOD, ED ([facility], [facility], [facility] ONLY)    EKG None  Radiology US Abdomen Limited RUQ (LIVER/GB)  Result Date: 10/04/2022 CLINICAL DATA:  Abdominal pain. EXAM: ULTRASOUND ABDOMEN LIMITED RIGHT UPPER QUADRANT COMPARISON:  CT abdomen pelvis dated 09/22/2022 and ultrasound dated 03/13/2022. FINDINGS: Gallbladder: The gallbladder is filled with stones and not well visualized due to shadowing. No pericholecystic fluid. Negative sonographic Murphy's sign. Common bile duct: Diameter: 5 mm Liver: There is diffuse increased liver echogenicity most commonly seen in the setting of fatty infiltration. Superimposed inflammation or fibrosis is not excluded. Clinical correlation is recommended. Portal vein is patent on color Doppler imaging with normal direction of blood flow towards the liver. Other: None. IMPRESSION: 1. Cholelithiasis without sonographic evidence of acute cholecystitis. 2. Fatty liver. Electronically Signed   By: Anner Crete M.D.   On: 10/04/2022 01:56    Procedures Procedures    Medications Ordered in ED Medications  enoxaparin (LOVENOX) injection 40 mg (has no administration in time range)  lactated ringers infusion (has no administration in time range)  HYDROmorphone (DILAUDID) injection 0.5 mg (has no administration in time range)  diphenhydrAMINE (BENADRYL) capsule 25 mg (has no  administration in time range)    Or  diphenhydrAMINE (BENADRYL) injection 25 mg (has no administration in time range)  ondansetron (ZOFRAN-ODT) disintegrating tablet 4 mg (has no administration in time range)    Or  ondansetron (ZOFRAN) injection 4 mg (has no administration in time range)  fentaNYL (SUBLIMAZE) injection 50 mcg (50 mcg Intravenous Given 10/04/22 0047)  ondansetron (ZOFRAN) injection 4 mg (4 mg Intravenous Given 10/04/22 0047)    ED Course/ Medical Decision Making/ A&P  Medical Decision Making Amount and/or Complexity of Data Reviewed Labs: ordered. Radiology: ordered.  Risk Prescription drug management. Decision regarding hospitalization.   22 y.o. F here with epigastric pain.  Seen here 10 days ago for same with reassuring work-up.  States pain increased in frequency, increased nausea/vomiting.  Known hx of gallstones, has never seen general surgery.  Afebrile, non-toxic in appearance.  More so tender epigastric area than RUQ.  Labs today does have new transaminitis compared with 10 days ago-- ALT 296/ AST 192.  Alk phos also elevated at 160.  Normal bili, normal lipase. RUQ [place] with gallstones but no findings of acute cholecystitis.  No heavy tylenol use.  No hx of hepatitis.  Given new lab abnormalities today and increasing frequency of symptoms, feel she would benefit from admission.  2:13 AM Discussed with general surgery, Dr. Zenia Resides-- will admit for cholecystectomy.  Final Clinical Impression(s) / ED Diagnoses Final diagnoses:  Calculus of gallbladder without cholecystitis without obstruction    Rx / DC Orders ED Discharge Orders     None         Larene Pickett, PA-C 10/04/22 0217    Ripley Fraise, MD 10/04/22 0302

## 2022-10-04 NOTE — H&P (Signed)
Judy Ballard 12-22-2000  [id number].     HPI:  Judy Ballard is a 22 yo female with known gallstones who presented to the ED with abdominal pain. The pain is in the epigastric area, and is worsened by eating. She says it has been occurring every few weeks to months for the last several months. This is her second ED visit within the last 2 weeks for this type of pain. RUQ US shows cholelithiasis without signs of cholecystitis. When she was in the ED on 2/28 she had a CT scan that did not show any acute abnormalities. Her transaminases are now mildly elevated (AST/ALT 296/192) which is new. Tbili is normal at 1.1. WBC and lipase are normal.  She is otherwise in good health and has not had any prior abdominal surgeries.  ROS: Review of Systems  Constitutional:  Negative for chills and fever.  Respiratory:  Negative for shortness of breath.   Cardiovascular:  Negative for chest pain.  Gastrointestinal:  Positive for abdominal pain.  Neurological:  Negative for loss of consciousness.    History reviewed. No pertinent family history.  Past Medical History:  Diagnosis Date   Right knee injury, initial encounter 08/10/2017   S/P tonsillectomy 07/16/2019   Tonsillitis    UTI (urinary tract infection)     Past Surgical History:  Procedure Laterality Date   CESAREAN SECTION N/A 10/05/2021   Procedure: CESAREAN SECTION;  Surgeon: Janyth Pupa, DO;  Location: [facility];  Service: Obstetrics;  Laterality: N/A;  Stat C/S for non reassuring fetal heart tracing   TONSILLECTOMY Bilateral 07/16/2019   Procedure: TONSILLECTOMY;  Surgeon: Izora Gala, MD;  Location: [facility];  Service: ENT;  Laterality: Bilateral;   UMBILICAL HERNIA REPAIR      Social History:  reports that she has never smoked. She has never used smokeless tobacco. She reports that she does not currently use drugs. She reports that she does not drink alcohol.  Allergies:  Allergies  Allergen Reactions    Apple Juice Hives    Swollen lips   Fish Allergy Hives    tilapia     Medications Prior to Admission  Medication Sig Dispense Refill   acetaminophen (TYLENOL) 500 MG tablet Take 2 tablets (1,000 mg total) by mouth every 4 (four) hours as needed for mild pain (temperature > 101.5.). (Patient not taking: Reported on 03/13/2022) 30 tablet 0   cephALEXin (KEFLEX) 500 MG capsule Take 1 capsule (500 mg total) by mouth 4 (four) times daily. 28 capsule 0   famotidine (PEPCID) 20 MG tablet Take 1 tablet (20 mg total) by mouth 2 (two) times daily. 30 tablet 0   naproxen (NAPROSYN) 500 MG tablet Take 1 tablet (500 mg total) by mouth 2 (two) times daily with a meal. (Patient not taking: Reported on 03/13/2022) 14 tablet 0   ondansetron (ZOFRAN-ODT) 4 MG disintegrating tablet Take 1 tablet (4 mg total) by mouth every 8 (eight) hours as needed for nausea or vomiting. 10 tablet 0     Physical Exam: Blood pressure 109/70, pulse 65, temperature 97.7 F (36.5 C), resp. rate 20, height '5\' 2"'$  (1.575 m), weight 91.5 kg, SpO2 100 %, not currently breastfeeding. General: resting comfortably, appears stated age, no apparent distress Neurological: alert and oriented, no focal deficits HEENT: normocephalic, atraumatic Respiratory: normal work of breathing on room air Abdomen: soft, nondistended, mild epigastric tenderness to palpation. Extremities: warm and well-perfused, no deformities, moving all extremities spontaneously Psychiatric: normal mood and affect  Skin: warm and dry, no jaundice, no rashes or lesions   Results for orders placed or performed during the hospital encounter of 10/03/22 (from the past 48 hour(s))  Lipase, blood     Status: None   Collection Time: 10/03/22 11:07 PM  Result Value Ref Range   Lipase 30 11 - 51 U/L    Comment: Performed at [facility], [place] [address], [place], [place] [address]  Comprehensive metabolic panel     Status: Abnormal   Collection Time: 10/03/22  11:07 PM  Result Value Ref Range   Sodium 138 135 - 145 mmol/L   Potassium 3.3 (L) 3.5 - 5.1 mmol/L   Chloride 103 98 - 111 mmol/L   CO2 20 (L) 22 - 32 mmol/L   Glucose, Bld 109 (H) 70 - 99 mg/dL    Comment: Glucose reference range applies only to samples taken after fasting for at least 8 hours.   BUN 7 6 - 20 mg/dL   Creatinine, Ser 0.77 0.44 - 1.00 mg/dL   Calcium 9.1 8.9 - 10.3 mg/dL   Total Protein 7.3 6.5 - 8.1 g/dL   Albumin 3.7 3.5 - 5.0 g/dL   AST 296 (H) 15 - 41 U/L   ALT 192 (H) 0 - 44 U/L   Alkaline Phosphatase 161 (H) 38 - 126 U/L   Total Bilirubin 1.0 0.3 - 1.2 mg/dL   GFR, Estimated >60 >60 mL/min    Comment: (NOTE) Calculated using the CKD-EPI Creatinine Equation (2021)    Anion gap 15 5 - 15    Comment: Performed at [facility] [address], [place], [place] [address]  CBC     Status: Abnormal   Collection Time: 10/03/22 11:07 PM  Result Value Ref Range   WBC 7.4 4.0 - 10.5 K/uL   RBC 4.34 3.87 - 5.11 MIL/uL   Hemoglobin 11.7 (L) 12.0 - 15.0 g/dL   HCT 36.5 36.0 - 46.0 %   MCV 84.1 80.0 - 100.0 fL   MCH 27.0 26.0 - 34.0 pg   MCHC 32.1 30.0 - 36.0 g/dL   RDW 14.1 11.5 - 15.5 %   Platelets 238 150 - 400 K/uL    Comment: REPEATED TO VERIFY   nRBC 0.0 0.0 - 0.2 %    Comment: Performed at [facility], [place] [address] [place], [place] [address]  Urinalysis, Routine w reflex microscopic -Urine, Clean Catch     Status: Abnormal   Collection Time: 10/03/22 11:07 PM  Result Value Ref Range   Color, Urine RED (A) YELLOW   APPearance TURBID (A) CLEAR   Specific Gravity, Urine  1.005 - 1.030    TEST NOT REPORTED DUE TO COLOR INTERFERENCE OF URINE PIGMENT   pH  5.0 - 8.0    TEST NOT REPORTED DUE TO COLOR INTERFERENCE OF URINE PIGMENT   Glucose, UA (A) NEGATIVE mg/dL    TEST NOT REPORTED DUE TO COLOR INTERFERENCE OF URINE PIGMENT   Hgb urine dipstick (A) NEGATIVE    TEST NOT REPORTED DUE TO COLOR INTERFERENCE OF URINE PIGMENT   Bilirubin Urine (A)  NEGATIVE    TEST NOT REPORTED DUE TO COLOR INTERFERENCE OF URINE PIGMENT   Ketones, ur (A) NEGATIVE mg/dL    TEST NOT REPORTED DUE TO COLOR INTERFERENCE OF URINE PIGMENT   Protein, ur (A) NEGATIVE mg/dL    TEST NOT REPORTED DUE TO COLOR INTERFERENCE OF URINE PIGMENT   Nitrite (A) NEGATIVE  TEST NOT REPORTED DUE TO COLOR INTERFERENCE OF URINE PIGMENT   Leukocytes,Ua (A) NEGATIVE    TEST NOT REPORTED DUE TO COLOR INTERFERENCE OF URINE PIGMENT   RBC / HPF >50 0 - 5 RBC/hpf   WBC, UA 0-5 0 - 5 WBC/hpf   Bacteria, UA RARE (A) NONE SEEN   Squamous Epithelial / HPF 0-5 0 - 5 /HPF   Mucus PRESENT     Comment: Performed at [facility], [place] [address], [place], [place] [address]  I-Stat beta hCG blood, ED     Status: None   Collection Time: 10/03/22 11:09 PM  Result Value Ref Range   I-stat hCG, quantitative <5.0 <5 mIU/mL   Comment 3            Comment:   GEST. AGE      CONC.  (mIU/mL)   <=1 WEEK        5 - 50     2 WEEKS       50 - 500     3 WEEKS       100 - 10,000     4 WEEKS     1,000 - 30,000        FEMALE AND NON-PREGNANT FEMALE:     LESS THAN 5 mIU/mL   CBC     Status: Abnormal   Collection Time: 10/04/22  3:59 AM  Result Value Ref Range   WBC 6.9 4.0 - 10.5 K/uL   RBC 4.11 3.87 - 5.11 MIL/uL   Hemoglobin 11.2 (L) 12.0 - 15.0 g/dL   HCT 34.1 (L) 36.0 - 46.0 %   MCV 83.0 80.0 - 100.0 fL   MCH 27.3 26.0 - 34.0 pg   MCHC 32.8 30.0 - 36.0 g/dL   RDW 14.0 11.5 - 15.5 %   Platelets 214 150 - 400 K/uL    Comment: REPEATED TO VERIFY   nRBC 0.0 0.0 - 0.2 %    Comment: Performed at [facility], [place] [address], [place], [place] [address]  Comprehensive metabolic panel     Status: Abnormal   Collection Time: 10/04/22  3:59 AM  Result Value Ref Range   Sodium 137 135 - 145 mmol/L   Potassium 3.3 (L) 3.5 - 5.1 mmol/L   Chloride 101 98 - 111 mmol/L   CO2 25 22 - 32 mmol/L   Glucose, Bld 95 70 - 99 mg/dL    Comment: Glucose reference range applies only to samples  taken after fasting for at least 8 hours.   BUN 6 6 - 20 mg/dL   Creatinine, Ser 0.66 0.44 - 1.00 mg/dL   Calcium 8.9 8.9 - 10.3 mg/dL   Total Protein 6.9 6.5 - 8.1 g/dL   Albumin 3.5 3.5 - 5.0 g/dL   AST 208 (H) 15 - 41 U/L   ALT 175 (H) 0 - 44 U/L   Alkaline Phosphatase 161 (H) 38 - 126 U/L   Total Bilirubin 1.1 0.3 - 1.2 mg/dL   GFR, Estimated >60 >60 mL/min    Comment: (NOTE) Calculated using the CKD-EPI Creatinine Equation (2021)    Anion gap 11 5 - 15    Comment: Performed at [facility], [place] [address] [place], [place] [address]   US Abdomen Limited RUQ (LIVER/GB)  Result Date: 10/04/2022 CLINICAL DATA:  Abdominal pain. EXAM: ULTRASOUND ABDOMEN LIMITED RIGHT UPPER QUADRANT COMPARISON:  CT abdomen pelvis dated 09/22/2022 and ultrasound dated 03/13/2022. FINDINGS: Gallbladder: The gallbladder is  filled with stones and not well visualized due to shadowing. No pericholecystic fluid. Negative sonographic Murphy's sign. Common bile duct: Diameter: 5 mm Liver: There is diffuse increased liver echogenicity most commonly seen in the setting of fatty infiltration. Superimposed inflammation or fibrosis is not excluded. Clinical correlation is recommended. Portal vein is patent on color Doppler imaging with normal direction of blood flow towards the liver. Other: None. IMPRESSION: 1. Cholelithiasis without sonographic evidence of acute cholecystitis. 2. Fatty liver. Electronically Signed   By: Anner Crete M.D.   On: 10/04/2022 01:56      Assessment/Plan 22 yo female with epigastric abdominal pain, elevated transaminases and gallstones. I personally reviewed her labs, notes and imaging. She has cholelithiasis but no signs of biliary obstruction. Her symptoms are consistent with biliary colic. Given the increasing frequency of symptoms and ED visits, will proceed with cholecystectomy with possible cholangiogram. - NPO, maintenance IV fluids - Pain and nausea control - OR today for  laparoscopic cholecystectomy with possible IOC with Dr. Grandville Silos. Procedure details reviewed. - VTE: lovenox, SCDs - Dispo: admit to observation, med-surg floor   Michaelle Birks, [facility], Hepatobiliary and Pancreatic Surgery 10/04/22 6:49 AM

## 2022-10-04 NOTE — Progress Notes (Signed)
Patient ID: Judy Ballard, female   DOB: 05-24-2001, 22 y.o.   MRN: [id number] Labs and U/S reviewed. Patient examined. I agree with Dr. Ayesha Rumpf assessment. Plan laparoscopic cholecystectomy, cholangiogram this AM. Procedure, risks, and benefits discussed and she agrees. IV Rocephin.  Georganna Skeans, MD, MPH, FACS Please use [email or web address] to contact on call provider

## 2022-10-05 ENCOUNTER — Encounter (HOSPITAL_COMMUNITY): Payer: Self-pay | Admitting: General Surgery

## 2022-10-05 LAB — SURGICAL PATHOLOGY

## 2022-10-05 MED ORDER — ACETAMINOPHEN 500 MG PO TABS: 1000.0000 mg | ORAL_TABLET | Freq: Four times a day (QID) | ORAL | Status: DC | PRN

## 2022-10-05 MED ORDER — OXYCODONE HCL 5 MG PO TABS: 5.0000 mg | ORAL_TABLET | Freq: Four times a day (QID) | ORAL | 0 refills | Status: DC | PRN

## 2022-10-05 NOTE — Plan of Care (Signed)
  Problem: Education: Goal: Knowledge of General Education information will improve Description: Including pain rating scale, medication(s)/side effects and non-pharmacologic comfort measures Outcome: Progressing   Problem: Health Behavior/Discharge Planning: Goal: Ability to manage health-related needs will improve Outcome: Progressing   Problem: Nutrition: Goal: Adequate nutrition will be maintained Outcome: Progressing   Problem: Elimination: Goal: Will not experience complications related to bowel motility Outcome: Progressing Goal: Will not experience complications related to urinary retention Outcome: Progressing   Problem: Pain Managment: Goal: General experience of comfort will improve Outcome: Progressing   Problem: Safety: Goal: Ability to remain free from injury will improve Outcome: Progressing   Problem: Skin Integrity: Goal: Risk for impaired skin integrity will decrease Outcome: Progressing   

## 2022-10-05 NOTE — Discharge Summary (Signed)
[facility] Discharge Summary   Patient ID: Judy Ballard MRN: [id number] DOB/AGE: 01-18-2001 22 y.o.  Admit date: 10/03/2022 Discharge date: 10/05/2022  Admitting Diagnosis: Acute cholecystitis   Discharge Diagnosis S/P laparoscopic cholecystectomy   Consultants None   Imaging: US Abdomen Limited RUQ (LIVER/GB)  Result Date: 10/04/2022 CLINICAL DATA:  Abdominal pain. EXAM: ULTRASOUND ABDOMEN LIMITED RIGHT UPPER QUADRANT COMPARISON:  CT abdomen pelvis dated 09/22/2022 and ultrasound dated 03/13/2022. FINDINGS: Gallbladder: The gallbladder is filled with stones and not well visualized due to shadowing. No pericholecystic fluid. Negative sonographic Murphy's sign. Common bile duct: Diameter: 5 mm Liver: There is diffuse increased liver echogenicity most commonly seen in the setting of fatty infiltration. Superimposed inflammation or fibrosis is not excluded. Clinical correlation is recommended. Portal vein is patent on color Doppler imaging with normal direction of blood flow towards the liver. Other: None. IMPRESSION: 1. Cholelithiasis without sonographic evidence of acute cholecystitis. 2. Fatty liver. Electronically Signed   By: Anner Crete M.D.   On: 10/04/2022 01:56    Procedures Dr. Georganna Skeans (10/04/22) - Laparoscopic Cholecystectomy with [facility] Course:  Patient is a 22 year old female who presented to the ED with abdominal pain.  Workup showed acute cholecystitis.  Patient was admitted and underwent procedure listed above.  Tolerated procedure well and was transferred to the floor.  Diet was advanced as tolerated.  On POD1, the patient was voiding well, tolerating diet, ambulating well, pain well controlled, vital signs stable, incisions c/d/i and felt stable for discharge home.  Patient will follow up in our office in 3 weeks and knows to call with questions or concerns. She will call to confirm appointment date/time.    Physical Exam: General:   Alert, NAD, pleasant, comfortable Abd:  Soft, ND, mild tenderness, incisions C/D/I  I or a member of my team have reviewed this patient in the Controlled Substance Database.   Allergies as of 10/05/2022       Reactions   Apple Juice Hives, Swelling   Swollen lips   Fish Allergy Hives   tilapia        Medication List     TAKE these medications    acetaminophen 500 MG tablet Commonly known as: TYLENOL Take 2 tablets (1,000 mg total) by mouth every 6 (six) hours as needed for mild pain or fever.   famotidine 20 MG tablet Commonly known as: PEPCID Take 1 tablet (20 mg total) by mouth 2 (two) times daily. What changed: when to take this   ondansetron 4 MG disintegrating tablet Commonly known as: ZOFRAN-ODT Take 1 tablet (4 mg total) by mouth every 8 (eight) hours as needed for nausea or vomiting. What changed: when to take this   oxyCODONE 5 MG immediate release tablet Commonly known as: Oxy IR/ROXICODONE Take 1 tablet (5 mg total) by mouth every 6 (six) hours as needed for moderate pain.          Follow-up Information     Maczis, Carlena [place]url, [place]. Go on 10/26/2022.   Specialty: General Surgery Why: Your appointment is 10/26/22 at 10:30 am Arrive 49mn early to check in, fill out paperwork, Bring photo ID and insurance information Contact information: [phone]                Signed: KNorm Parcel, [facility] 10/05/2022, 9:47 AM Please see Amion for pager number during day hours 7:00am-4:30pm

## 2022-12-01 ENCOUNTER — Encounter (HOSPITAL_COMMUNITY): Payer: Self-pay | Admitting: Emergency Medicine

## 2022-12-01 ENCOUNTER — Other Ambulatory Visit: Payer: Self-pay

## 2022-12-01 ENCOUNTER — Emergency Department (HOSPITAL_COMMUNITY)
Admission: EM | Admit: 2022-12-01 | Discharge: 2022-12-02 | Disposition: A | Discharge status: Home / Self Care | Payer: Medicaid Other | Attending: Emergency Medicine | Admitting: Emergency Medicine

## 2022-12-01 DIAGNOSIS — R300 Dysuria: Secondary | ICD-10-CM | POA: Diagnosis not present

## 2022-12-01 DIAGNOSIS — N390 Urinary tract infection, site not specified: Secondary | ICD-10-CM | POA: Diagnosis not present

## 2022-12-01 DIAGNOSIS — N134 Hydroureter: Secondary | ICD-10-CM | POA: Diagnosis not present

## 2022-12-01 DIAGNOSIS — N1339 Other hydronephrosis: Secondary | ICD-10-CM

## 2022-12-01 DIAGNOSIS — N130 Hydronephrosis with ureteropelvic junction obstruction: Secondary | ICD-10-CM | POA: Diagnosis not present

## 2022-12-01 DIAGNOSIS — R319 Hematuria, unspecified: Secondary | ICD-10-CM

## 2022-12-01 DIAGNOSIS — D649 Anemia, unspecified: Secondary | ICD-10-CM | POA: Insufficient documentation

## 2022-12-01 DIAGNOSIS — E876 Hypokalemia: Secondary | ICD-10-CM | POA: Diagnosis not present

## 2022-12-01 DIAGNOSIS — N1330 Unspecified hydronephrosis: Secondary | ICD-10-CM | POA: Diagnosis not present

## 2022-12-01 NOTE — ED Triage Notes (Signed)
Pt in with bladder pain and R flank pain with blood in her urine x 3 days. Denies any fevers. Pt states LMP 4/29

## 2022-12-02 ENCOUNTER — Emergency Department (HOSPITAL_COMMUNITY): Payer: Medicaid Other

## 2022-12-02 DIAGNOSIS — N130 Hydronephrosis with ureteropelvic junction obstruction: Secondary | ICD-10-CM | POA: Diagnosis not present

## 2022-12-02 DIAGNOSIS — N134 Hydroureter: Secondary | ICD-10-CM | POA: Diagnosis not present

## 2022-12-02 LAB — COMPREHENSIVE METABOLIC PANEL
ALT: 15 U/L (ref 0–44)
AST: 22 U/L (ref 15–41)
Albumin: 3.5 g/dL (ref 3.5–5.0)
Alkaline Phosphatase: 62 U/L (ref 38–126)
Anion gap: 8 (ref 5–15)
BUN: 9 mg/dL (ref 6–20)
CO2: 24 mmol/L (ref 22–32)
Calcium: 8.7 mg/dL — ABNORMAL LOW (ref 8.9–10.3)
Chloride: 104 mmol/L (ref 98–111)
Creatinine, Ser: 0.74 mg/dL (ref 0.44–1.00)
GFR, Estimated: >60 mL/min (ref >60)
Glucose, Bld: 91 mg/dL (ref 70–99)
Potassium: 3.3 mmol/L — ABNORMAL LOW (ref 3.5–5.1)
Sodium: 136 mmol/L (ref 135–145)
Total Bilirubin: 0.8 mg/dL (ref 0.3–1.2)
Total Protein: 6.9 g/dL (ref 6.5–8.1)

## 2022-12-02 LAB — CBC WITH DIFFERENTIAL/PLATELET
Abs Immature Granulocytes: 0.03 10*3/uL (ref 0.00–0.07)
Basophils Absolute: 0 10*3/uL (ref 0.0–0.1)
Basophils Relative: 0 %
Eosinophils Absolute: 0.1 10*3/uL (ref 0.0–0.5)
Eosinophils Relative: 1 %
HCT: 31.4 % — ABNORMAL LOW (ref 36.0–46.0)
Hemoglobin: 10.5 g/dL — ABNORMAL LOW (ref 12.0–15.0)
Immature Granulocytes: 0 %
Lymphocytes Relative: 15 %
Lymphs Abs: 1.4 10*3/uL (ref 0.7–4.0)
MCH: 27.5 pg (ref 26.0–34.0)
MCHC: 33.4 g/dL (ref 30.0–36.0)
MCV: 82.2 fL (ref 80.0–100.0)
Monocytes Absolute: 0.8 10*3/uL (ref 0.1–1.0)
Monocytes Relative: 9 %
Neutro Abs: 6.8 10*3/uL (ref 1.7–7.7)
Neutrophils Relative %: 75 %
Platelets: 191 10*3/uL (ref 150–400)
RBC: 3.82 MIL/uL — ABNORMAL LOW (ref 3.87–5.11)
RDW: 14.1 % (ref 11.5–15.5)
WBC: 9.2 10*3/uL (ref 4.0–10.5)
nRBC: 0 % (ref 0.0–0.2)

## 2022-12-02 LAB — URINALYSIS, ROUTINE W REFLEX MICROSCOPIC
RBC / HPF: >50 RBC/hpf (ref 0–5)
WBC, UA: >50 WBC/hpf (ref 0–5)

## 2022-12-02 LAB — I-STAT BETA HCG BLOOD, ED (MC, WL, AP ONLY): I-stat hCG, quantitative: <5 m[IU]/mL (ref <5)

## 2022-12-02 MED ORDER — SODIUM CHLORIDE 0.9 % IV BOLUS
1000.0000 mL | Freq: Once | INTRAVENOUS | Status: AC
  Administered 2022-12-02: 1000 mL via INTRAVENOUS

## 2022-12-02 MED ORDER — MORPHINE SULFATE (PF) 4 MG/ML IV SOLN
4.0000 mg | Freq: Once | INTRAVENOUS | Status: AC
  Administered 2022-12-02: 4 mg via INTRAVENOUS
  Filled 2022-12-02: qty 1

## 2022-12-02 MED ORDER — SODIUM CHLORIDE 0.9 % IV SOLN
1.0000 g | Freq: Once | INTRAVENOUS | Status: AC
  Administered 2022-12-02: 1 g via INTRAVENOUS
  Filled 2022-12-02: qty 10

## 2022-12-02 MED ORDER — CEPHALEXIN 500 MG PO CAPS: 500.0000 mg | ORAL_CAPSULE | Freq: Two times a day (BID) | ORAL | 0 refills | Status: AC

## 2022-12-02 MED ORDER — CEPHALEXIN 500 MG PO CAPS: 500.0000 mg | ORAL_CAPSULE | Freq: Two times a day (BID) | ORAL | 0 refills | Status: DC

## 2022-12-02 MED ORDER — ONDANSETRON HCL 4 MG/2ML IJ SOLN
4.0000 mg | Freq: Once | INTRAMUSCULAR | Status: AC
  Administered 2022-12-02: 4 mg via INTRAVENOUS
  Filled 2022-12-02: qty 2

## 2022-12-02 NOTE — ED Provider Notes (Signed)
[place] EMERGENCY DEPARTMENT AT [facility] Provider Note   CSN: [id number] Arrival date & time: 12/01/22  2332     History  Chief Complaint  Patient presents with   Hematuria   Dysuria    Judy Ballard is a 22 y.o. female.  22 year old female with complaint of right side abdominal pain and hematuria with urinary frequency. Denies dysuria, nausea, vomiting, fevers, chills. Notes hematuria without pain onset 3 days ago, pain starting tonight prompted ER visit. History of recent lap chole, hematuria/pyelo, possible kidney stones. States she was referred to a nephrologist at one point in time but did not follow up.       Home Medications Prior to Admission medications   Medication Sig Start Date End Date Taking? Authorizing Provider  cephALEXin (KEFLEX) 500 MG capsule Take 1 capsule (500 mg total) by mouth 2 (two) times daily for 5 days. 12/02/22 12/07/22 Yes Jeannie Fend, PA-C  acetaminophen (TYLENOL) 500 MG tablet Take 2 tablets (1,000 mg total) by mouth every 6 (six) hours as needed for mild pain or fever. 10/05/22   Juliet Rude, PA-C  famotidine (PEPCID) 20 MG tablet Take 1 tablet (20 mg total) by mouth 2 (two) times daily. Patient taking differently: Take 20 mg by mouth daily. 09/22/22   Renne Crigler, PA-C  ondansetron (ZOFRAN-ODT) 4 MG disintegrating tablet Take 1 tablet (4 mg total) by mouth every 8 (eight) hours as needed for nausea or vomiting. Patient taking differently: Take 4 mg by mouth as needed for nausea or vomiting. 09/22/22   Renne Crigler, PA-C  oxyCODONE (OXY IR/ROXICODONE) 5 MG immediate release tablet Take 1 tablet (5 mg total) by mouth every 6 (six) hours as needed for moderate pain. 10/05/22   Juliet Rude, PA-C      Allergies    Apple juice and Fish allergy    Review of Systems   Review of Systems Negative except as per HPI Physical Exam Updated Vital Signs BP 111/71   Pulse 90   Temp 98.8 F (37.1 C)   Resp (!) 22   Wt  88.5 kg   LMP 11/22/2022   SpO2 100%   BMI 35.67 kg/m  Physical Exam Vitals and nursing note reviewed.  Constitutional:      General: She is not in acute distress.    Appearance: She is well-developed. She is not diaphoretic.  HENT:     Head: Normocephalic and atraumatic.  Cardiovascular:     Rate and Rhythm: Normal rate and regular rhythm.     Pulses: Normal pulses.     Heart sounds: Normal heart sounds.  Pulmonary:     Effort: Pulmonary effort is normal.     Breath sounds: Normal breath sounds.  Abdominal:     Palpations: Abdomen is soft.     Tenderness: There is abdominal tenderness in the right upper quadrant and right lower quadrant. There is no right CVA tenderness, left CVA tenderness, guarding or rebound.     Comments: Recent surgical sites (lap chole) healing well without evidence of infection   Musculoskeletal:     Right lower leg: No edema.     Left lower leg: No edema.  Skin:    General: Skin is warm and dry.     Findings: No erythema or rash.  Neurological:     Mental Status: She is alert and oriented to person, place, and time.  Psychiatric:        Behavior: Behavior normal.  ED Results / Procedures / Treatments   Labs (all labs ordered are listed, but only abnormal results are displayed) Labs Reviewed  URINALYSIS, ROUTINE W REFLEX MICROSCOPIC - Abnormal; Notable for the following components:      Result Value   Color, Urine RED (*)    APPearance TURBID (*)    Glucose, UA   (*)    Value: TEST NOT REPORTED DUE TO COLOR INTERFERENCE OF URINE PIGMENT   Hgb urine dipstick   (*)    Value: TEST NOT REPORTED DUE TO COLOR INTERFERENCE OF URINE PIGMENT   Bilirubin Urine   (*)    Value: TEST NOT REPORTED DUE TO COLOR INTERFERENCE OF URINE PIGMENT   Ketones, ur   (*)    Value: TEST NOT REPORTED DUE TO COLOR INTERFERENCE OF URINE PIGMENT   Protein, ur   (*)    Value: TEST NOT REPORTED DUE TO COLOR INTERFERENCE OF URINE PIGMENT   Nitrite   (*)    Value: TEST  NOT REPORTED DUE TO COLOR INTERFERENCE OF URINE PIGMENT   Leukocytes,Ua   (*)    Value: TEST NOT REPORTED DUE TO COLOR INTERFERENCE OF URINE PIGMENT   Bacteria, UA RARE (*)    All other components within normal limits  CBC WITH DIFFERENTIAL/PLATELET - Abnormal; Notable for the following components:   RBC 3.82 (*)    Hemoglobin 10.5 (*)    HCT 31.4 (*)    All other components within normal limits  COMPREHENSIVE METABOLIC PANEL - Abnormal; Notable for the following components:   Potassium 3.3 (*)    Calcium 8.7 (*)    All other components within normal limits  URINE CULTURE  I-STAT BETA HCG BLOOD, ED ([facility], [facility], [facility] ONLY)    EKG None  Radiology CT Renal Stone Study  Result Date: 12/02/2022 CLINICAL DATA:  Right-sided flank pain and hematuria for several days, initial encounter EXAM: CT ABDOMEN AND PELVIS WITHOUT CONTRAST TECHNIQUE: Multidetector CT imaging of the abdomen and pelvis was performed following the standard protocol without IV contrast. RADIATION DOSE REDUCTION: This exam was performed according to the departmental dose-optimization program which includes automated exposure control, adjustment of the mA and/or kV according to patient size and/or use of iterative reconstruction technique. COMPARISON:  None Available. FINDINGS: Lower chest: No acute abnormality. Hepatobiliary: No focal liver abnormality is seen. Status post cholecystectomy. No biliary dilatation. Pancreas: Unremarkable. No pancreatic ductal dilatation or surrounding inflammatory changes. Spleen: Normal in size without focal abnormality. Adrenals/Urinary Tract: Adrenal glands are within normal limits. Left kidney is well visualized and within normal limits. Right kidney demonstrates hydronephrosis as well as some dependent hyperdense material suggestive of blood in the collecting system. The ureter is significantly dilated in the proximal and midportion although the more distal ureter appears within normal limits. No  definitive stone is identified. Bladder is decompressed. Stomach/Bowel: The appendix is well visualized and within normal limits. No inflammatory changes are seen. No obstructive or inflammatory changes of the colon are noted. Small bowel and stomach are within normal limits. Vascular/Lymphatic: No significant vascular findings are present. No enlarged abdominal or pelvic lymph nodes. Reproductive: Uterus and bilateral adnexa are unremarkable. Other: No abdominal wall hernia or abnormality. No abdominopelvic ascites. Musculoskeletal: No acute or significant osseous findings. IMPRESSION: Significant right sided hydronephrosis and hydroureter with evidence of dependent hyperdense material likely related to blood in the collecting system. No discrete obstructing stone is noted. The dilatation could be related to thrombus given the hyperdense urine. Electronically Signed  By: Alcide Clever M.D.   On: 12/02/2022 02:37    Procedures Procedures    Medications Ordered in ED Medications  cefTRIAXone (ROCEPHIN) 1 g in sodium chloride 0.9 % 100 mL IVPB (1 g Intravenous New Bag/Given 12/02/22 0347)  ondansetron (ZOFRAN) injection 4 mg (4 mg Intravenous Given 12/02/22 0238)  morphine (PF) 4 MG/ML injection 4 mg (4 mg Intravenous Given 12/02/22 0238)  sodium chloride 0.9 % bolus 1,000 mL (1,000 mLs Intravenous New Bag/Given 12/02/22 0237)    ED Course/ Medical Decision Making/ A&P                             Medical Decision Making Amount and/or Complexity of Data Reviewed Labs: ordered. Radiology: ordered.  Risk Prescription drug management.   This patient presents to the ED for concern of right side abdominal pain with hematuria, this involves an extensive number of treatment options, and is a complaint that carries with it a high risk of complications and morbidity.  The differential diagnosis includes but not limited to cystitis, stone, pyelo, appendicitis    Co morbidities that complicate the patient  evaluation  Pyelonephritis, gestation DM/HTN, recent lap chole (10/04/22)   Additional history obtained:  External records from outside source obtained and reviewed including prior imaging on file" Renal US 09/10/21 showing marked hydro of right kidney without mass or calcification. Absent right ureteral jet CT a/p with contrast dated 03/13/22 with calyceal dilatation of the major calices but no renal pelvis dilatation or hydro. Heterogeneous enhancement of the right kidney, suspicious for pyelonephritis. Mild calyceal dilatation of the right kidney without frank hydro.  CT a/p with contrast dated 09/22/22 with mild scarring of the right kidney, possibly subsequent to previous pyelonephritis/renal obstruction. Mild fullness of the right renal collecting system and ureter but no evidence of ureteral stone.    Lab Tests:  I Ordered, and personally interpreted labs.  The pertinent results include: Urinalysis which is largely not reportable due to gross hematuria, does have greater than 50 RBCs, greater than 50 WBC, rare bacteria with epithelials. Hcg negative.  CBC with mild anemia, hemoglobin 10.5, not significantly changed from prior on file.  CMP with mild hypokalemia with potassium of 3.3 otherwise no significant findings.   Imaging Studies ordered:  I ordered imaging studies including CT stone study  I independently visualized and interpreted imaging which showed significant right-sided hydronephrosis I agree with the radiologist interpretation   Consultations Obtained:  I requested consultation with the ER attending, Dr. Preston Fleeting,  and discussed lab and imaging findings as well as pertinent plan - they recommend: Treat with antibiotics and referred to urology   Problem List / ED Course / Critical interventions / Medication management  22 year old female with complaint of gross hematuria now with right side abdominal pain.  History of prior episodes of gross hematuria, previously referred  to urology and has not followed up.  No reports of nausea, vomiting, fevers or chills.  On exam, patient is well-appearing, right-sided abdominal tenderness without guarding or rebound.  Urinalysis with gross hematuria limits exam, add on culture.  CBC and metabolic panel without significant changes from prior.  CT today is negative for stone, shows normal appendix, persistent right hydronephrosis.  Discussed with ER attending, plan is to cover with antibiotics and referred to urology.  Discussed results and plan of care with patient, emphasized importance of following up with urology due to her frequent episodes of gross hematuria and  persistently abnormal right kidney on imaging. I ordered medication including morphine, Zofran, Rocephin and IV fluids for pain, urinary tract infection Reevaluation of the patient after these medicines showed that the patient improved I have reviewed the patients home medicines and have made adjustments as needed   Social Determinants of Health:  Has PCP   Test / Admission - Considered:  Stable for discharge with plan for follow-up with urology with return to ER precautions         Final Clinical Impression(s) / ED Diagnoses Final diagnoses:  Hematuria, unspecified type  Other hydronephrosis    Rx / DC Orders ED Discharge Orders          Ordered    cephALEXin (KEFLEX) 500 MG capsule  2 times daily        12/02/22 0341              Jeannie Fend, PA-C 12/02/22 0402    Dione Booze, MD 12/02/22 [phone]

## 2022-12-02 NOTE — Discharge Instructions (Addendum)
Take antibiotic as prescribed. Follow-up with urology, call in the morning to schedule this appointment.  This is important due to recurrent episodes of blood in your urine and abnormal imaging of your right kidney.  Return to the ER for new or worsening symptoms.

## 2022-12-02 NOTE — ED Notes (Signed)
Pt unable to provide urine sample at this time 

## 2022-12-03 LAB — URINE CULTURE: Culture: 60000 — AB

## 2022-12-04 ENCOUNTER — Telehealth (HOSPITAL_BASED_OUTPATIENT_CLINIC_OR_DEPARTMENT_OTHER): Payer: Self-pay | Admitting: *Deleted

## 2022-12-04 NOTE — Telephone Encounter (Signed)
Post ED Visit - Positive Culture Follow-up  Culture report reviewed by antimicrobial stewardship pharmacist: [facility] [address] Pharm.D. []  Celedonio Miyamoto, Pharm.D., BCPS AQ-ID []  Garvin Fila, Pharm.D., BCPS []  Georgina Pillion, Pharm.D., BCPS []  Helper, [place].D., BCPS, AAHIVP []  Estella Husk, Pharm.D., BCPS, AAHIVP []  Lysle Pearl, PharmD, BCPS []  Phillips Climes, PharmD, BCPS []  Agapito Games, PharmD, BCPS []  Verlan Friends, PharmD []  Mervyn Gay, PharmD, BCPS [x]  Delmar Landau, PharmD  [facility] []  Len Childs, PharmD []  Greer Pickerel, PharmD []  Adalberto Cole, PharmD []  Perlie Gold, Rph []  Lonell Face) Jean Rosenthal, PharmD []  Earl Many, PharmD []  Junita Push, PharmD []  Dorna Leitz, PharmD []  Terrilee Files, PharmD []  Lynann Beaver, PharmD []  Keturah Barre, PharmD []  Loralee Pacas, PharmD []  Bernadene Person, PharmD   Positive urine culture Treated with Cephalexin, organism sensitive to the same and no further patient follow-up is required at this time.  Judy Ballard 12/04/2022, 3:01 PM

## 2022-12-09 ENCOUNTER — Ambulatory Visit (INDEPENDENT_AMBULATORY_CARE_PROVIDER_SITE_OTHER): Payer: Medicaid Other | Admitting: Urology

## 2022-12-09 ENCOUNTER — Encounter: Payer: Self-pay | Admitting: Urology

## 2022-12-09 VITALS — BP 115/77 | HR 76 | Ht 62.0 in | Wt 195.0 lb

## 2022-12-09 DIAGNOSIS — R319 Hematuria, unspecified: Secondary | ICD-10-CM | POA: Diagnosis not present

## 2022-12-09 DIAGNOSIS — N390 Urinary tract infection, site not specified: Secondary | ICD-10-CM

## 2022-12-09 DIAGNOSIS — Z87440 Personal history of urinary (tract) infections: Secondary | ICD-10-CM

## 2022-12-09 LAB — URINALYSIS, ROUTINE W REFLEX MICROSCOPIC
Bilirubin, UA: NEGATIVE
Glucose, UA: NEGATIVE
Ketones, UA: NEGATIVE
Nitrite, UA: NEGATIVE
Protein,UA: NEGATIVE
Specific Gravity, UA: 1.025 (ref 1.005–1.030)
Urobilinogen, Ur: 0.2 mg/dL (ref 0.2–1.0)
pH, UA: 7 (ref 5.0–7.5)

## 2022-12-09 LAB — MICROSCOPIC EXAMINATION
Cast Type: NONE SEEN
Casts: NONE SEEN /lpf
Crystal Type: NONE SEEN
Crystals: NONE SEEN
Mucus, UA: NONE SEEN
Renal Epithel, UA: NONE SEEN /hpf
Trichomonas, UA: NONE SEEN
WBC, UA: >30 /hpf — AB (ref 0–5)
Yeast, UA: NONE SEEN

## 2022-12-09 MED ORDER — CEFDINIR 300 MG PO CAPS: 300.0000 mg | ORAL_CAPSULE | Freq: Two times a day (BID) | ORAL | 0 refills | Status: DC

## 2022-12-09 MED ORDER — NITROFURANTOIN MONOHYD MACRO 100 MG PO CAPS: 100.0000 mg | ORAL_CAPSULE | Freq: Every day | ORAL | 0 refills | Status: DC

## 2022-12-09 NOTE — Progress Notes (Signed)
Assessment: 1. Hematuria, unspecified type   2. Recurrent UTI      Plan: Urinalysis today is suspicious for UTI.  Will treat empirically and also begin low-dose suppression given her recurrent UTIs until further evaluation is completed. Patient will return in 10 to 14 days with a CT-hematuria protocol prior to visit  Chief Complaint: hematuria  History of Present Illness:  Judy Ballard is a 22 y.o. female who is seen in consultation from [place], [place] for evaluation of gross hematuria and recurrent UTIs.. Patient has a history of pyelonephritis in the past and had documented E. coli UTI in March 2024.  CT A/P with contrast at that time demonstrated some mild scarring of the right kidney possibly related to prior pyelonephritis with some fullness of the collecting system and ureter but no evidence of stone or other acute findings.  The patient had a subsequent cholecystectomy.  Patient subsequently presented to the emergency department on 12/02/2022 with complaints of gross hematuria and some right flank tenderness.  CT stone study demonstrated hydro on the right with high density material consistent with clot within the renal collecting system.  Laboratory studies at that time demonstrated normal renal function and urine culture grew 60,000 colonies of strep. The patient reports that the hematuria subsequently resolved.  She reports normal voiding at present.  She denies any dysuria frequency or flank pain.  She has not had any gross hematuria in the last 5 days.   Past Medical History:  Past Medical History:  Diagnosis Date   Right knee injury, initial encounter 08/10/2017   S/P tonsillectomy 07/16/2019   Tonsillitis    UTI (urinary tract infection)     Past Surgical History:  Past Surgical History:  Procedure Laterality Date   CESAREAN SECTION N/A 10/05/2021   Procedure: CESAREAN SECTION;  Surgeon: Myna Hidalgo, DO;  Location: [facility];  Service: Obstetrics;   Laterality: N/A;  Stat C/S for non reassuring fetal heart tracing   CHOLECYSTECTOMY N/A 10/04/2022   Procedure: LAPAROSCOPIC CHOLECYSTECTOMY;  Surgeon: Violeta Gelinas, MD;  Location: [facility] OR;  Service: General;  Laterality: N/A;   TONSILLECTOMY Bilateral 07/16/2019   Procedure: TONSILLECTOMY;  Surgeon: Serena Colonel, MD;  Location: [facility];  Service: ENT;  Laterality: Bilateral;   UMBILICAL HERNIA REPAIR      Allergies:  Allergies  Allergen Reactions   Apple Juice Hives and Swelling    Swollen lips   Fish Allergy Hives    tilapia     Family History:  No family history on file.  Social History:  Social History   Tobacco Use   Smoking status: Never   Smokeless tobacco: Never  Vaping Use   Vaping Use: Never used  Substance Use Topics   Alcohol use: No   Drug use: Not Currently    Review of symptoms:  Constitutional:  Negative for unexplained weight loss, night sweats, fever, chills ENT:  Negative for nose bleeds, sinus pain, painful swallowing CV:  Negative for chest pain, shortness of breath, exercise intolerance, palpitations, loss of consciousness Resp:  Negative for cough, wheezing, shortness of breath GI:  Negative for nausea, vomiting, diarrhea, bloody stools GU:  Positives noted in HPI; otherwise negative for gross hematuria, dysuria, urinary incontinence Neuro:  Negative for seizures, poor balance, limb weakness, slurred speech Psych:  Negative for lack of energy, depression, anxiety Endocrine:  Negative for polydipsia, polyuria, symptoms of hypoglycemia (dizziness, hunger, sweating) Hematologic:  Negative for anemia, purpura, petechia, prolonged or excessive  bleeding, use of anticoagulants  Allergic:  Negative for difficulty breathing or choking as a result of exposure to anything; no shellfish allergy; no allergic response (rash/itch) to materials, foods  Physical exam: BP 115/77   Pulse 76   Ht 5\' 2"  (1.575 m)   Wt 195 lb (88.5 kg)   LMP  11/22/2022   BMI 35.67 kg/m  GENERAL APPEARANCE:  Well appearing, well developed, well nourished, NAD   Results: UA today is suspicious for UTI

## 2022-12-12 LAB — URINE CULTURE

## 2022-12-22 ENCOUNTER — Ambulatory Visit (HOSPITAL_BASED_OUTPATIENT_CLINIC_OR_DEPARTMENT_OTHER)
Admission: RE | Admit: 2022-12-22 | Discharge: 2022-12-22 | Disposition: A | Discharge status: Home / Self Care | Payer: Medicaid Other | Source: Ambulatory Visit | Attending: Urology | Admitting: Urology

## 2022-12-22 ENCOUNTER — Encounter (HOSPITAL_BASED_OUTPATIENT_CLINIC_OR_DEPARTMENT_OTHER): Payer: Self-pay

## 2022-12-22 DIAGNOSIS — N390 Urinary tract infection, site not specified: Secondary | ICD-10-CM | POA: Insufficient documentation

## 2022-12-22 DIAGNOSIS — R319 Hematuria, unspecified: Secondary | ICD-10-CM | POA: Insufficient documentation

## 2022-12-22 MED ORDER — IOHEXOL 300 MG/ML  SOLN
100.0000 mL | Freq: Once | INTRAMUSCULAR | Status: AC | PRN
  Administered 2022-12-22: 150 mL via INTRAVENOUS

## 2022-12-30 ENCOUNTER — Ambulatory Visit: Payer: Medicaid Other | Admitting: Urology

## 2023-01-11 ENCOUNTER — Ambulatory Visit (INDEPENDENT_AMBULATORY_CARE_PROVIDER_SITE_OTHER): Payer: Medicaid Other | Admitting: Urology

## 2023-01-11 ENCOUNTER — Encounter: Payer: Self-pay | Admitting: Urology

## 2023-01-11 VITALS — BP 110/77 | HR 79 | Ht 62.0 in | Wt 190.0 lb

## 2023-01-11 DIAGNOSIS — R319 Hematuria, unspecified: Secondary | ICD-10-CM | POA: Diagnosis not present

## 2023-01-11 DIAGNOSIS — N390 Urinary tract infection, site not specified: Secondary | ICD-10-CM

## 2023-01-11 DIAGNOSIS — Z87440 Personal history of urinary (tract) infections: Secondary | ICD-10-CM

## 2023-01-11 LAB — URINALYSIS, ROUTINE W REFLEX MICROSCOPIC
Bilirubin, UA: NEGATIVE
Glucose, UA: NEGATIVE
Ketones, UA: NEGATIVE
Nitrite, UA: NEGATIVE
Protein,UA: NEGATIVE
Specific Gravity, UA: >1.03 — ABNORMAL HIGH (ref 1.005–1.030)
Urobilinogen, Ur: 1 mg/dL (ref 0.2–1.0)
pH, UA: 6.5 (ref 5.0–7.5)

## 2023-01-11 LAB — MICROSCOPIC EXAMINATION
RBC, Urine: >30 /hpf — AB (ref 0–2)
WBC, UA: >30 /hpf — AB (ref 0–5)

## 2023-01-11 MED ORDER — NITROFURANTOIN MONOHYD MACRO 100 MG PO CAPS: 100.0000 mg | ORAL_CAPSULE | Freq: Every day | ORAL | 0 refills | Status: DC

## 2023-01-11 NOTE — Progress Notes (Signed)
Assessment: 1. Hematuria, unspecified type   2. Recurrent UTI     Plan: Will continue LD nitrofurantoin x 6 weeks UTI prevention discussed in detail Start probiotic FU 7mo with renal US prior  Chief Complaint: FU uti  HPI: Judy Ballard is a 22 y.o. female who presents for continued evaluation of recurrent urinary tract infection and gross hematuria. Patient reports she currently feels well.  She has had no further episodes of gross hematuria.  She is taking low-dose suppression with nitrofurantoin.  CT heme study:12/22/2022 No adrenal masses identified. No evidence of urolithiasis. No suspicious renal masses identified. No masses seen involving the collecting systems, ureters, or bladder. Mild diffuse right renal parenchymal atrophy noted. Mild right renal pelvicaliectasis and ureterectasis again seen, but mildly decreased since previous study. No evidence of ureteral calculi or other obstructing etiology.     See my note 12/09/2022 at the time of initial visit for detailed history-summarized below Patient has a history of pyelonephritis in the past and had documented E. coli UTI in March 2024.  CT A/P with contrast at that time demonstrated some mild scarring of the right kidney possibly related to prior pyelonephritis with some fullness of the collecting system and ureter but no evidence of stone or other acute findings.  The patient had a subsequent cholecystectomy.  Patient subsequently presented to the emergency department on 12/02/2022 with complaints of gross hematuria and some right flank tenderness.  CT stone study demonstrated hydro on the right with high density material consistent with clot within the renal collecting system.  Laboratory studies at that time demonstrated normal renal function and urine culture grew 60,000 colonies of strep. The patient reports that the hematuria subsequently resolved.  She reports normal voiding at present.  She denies any dysuria  frequency or flank pain.  She has not had any gross hematuria in the last 5 days.   Portions of the above documentation were copied from a prior visit for review purposes only.  Allergies: Allergies  Allergen Reactions   Apple Juice Hives and Swelling    Swollen lips   Fish Allergy Hives    tilapia     PMH: Past Medical History:  Diagnosis Date   Right knee injury, initial encounter 08/10/2017   S/P tonsillectomy 07/16/2019   Tonsillitis    UTI (urinary tract infection)     PSH: Past Surgical History:  Procedure Laterality Date   CESAREAN SECTION N/A 10/05/2021   Procedure: CESAREAN SECTION;  Surgeon: Myna Hidalgo, DO;  Location: [facility];  Service: Obstetrics;  Laterality: N/A;  Stat C/S for non reassuring fetal heart tracing   CHOLECYSTECTOMY N/A 10/04/2022   Procedure: LAPAROSCOPIC CHOLECYSTECTOMY;  Surgeon: Violeta Gelinas, MD;  Location: [facility] OR;  Service: General;  Laterality: N/A;   TONSILLECTOMY Bilateral 07/16/2019   Procedure: TONSILLECTOMY;  Surgeon: Serena Colonel, MD;  Location: [facility];  Service: ENT;  Laterality: Bilateral;   UMBILICAL HERNIA REPAIR      SH: Social History   Tobacco Use   Smoking status: Never   Smokeless tobacco: Never  Vaping Use   Vaping Use: Never used  Substance Use Topics   Alcohol use: No   Drug use: Not Currently    ROS: Constitutional:  Negative for fever, chills, weight loss CV: Negative for chest pain, previous MI, hypertension Respiratory:  Negative for shortness of breath, wheezing, sleep apnea, frequent cough GI:  Negative for nausea, vomiting, bloody stool, GERD  PE: BP 110/77   Pulse  79   Ht 5\' 2"  (1.575 m)   Wt 190 lb (86.2 kg)   LMP 12/16/2022   BMI 34.75 kg/m  GENERAL APPEARANCE:  Well appearing, well developed, well nourished, NAD

## 2023-03-01 ENCOUNTER — Emergency Department (HOSPITAL_COMMUNITY)
Admission: EM | Admit: 2023-03-01 | Discharge: 2023-03-01 | Disposition: A | Discharge status: Home / Self Care | Payer: Medicaid Other | Attending: Emergency Medicine | Admitting: Emergency Medicine

## 2023-03-01 ENCOUNTER — Encounter (HOSPITAL_COMMUNITY): Payer: Self-pay

## 2023-03-01 ENCOUNTER — Emergency Department (HOSPITAL_COMMUNITY): Payer: Medicaid Other

## 2023-03-01 DIAGNOSIS — Z3A00 Weeks of gestation of pregnancy not specified: Secondary | ICD-10-CM | POA: Diagnosis not present

## 2023-03-01 DIAGNOSIS — R9389 Abnormal findings on diagnostic imaging of other specified body structures: Secondary | ICD-10-CM | POA: Diagnosis not present

## 2023-03-01 DIAGNOSIS — D649 Anemia, unspecified: Secondary | ICD-10-CM | POA: Insufficient documentation

## 2023-03-01 DIAGNOSIS — N939 Abnormal uterine and vaginal bleeding, unspecified: Secondary | ICD-10-CM

## 2023-03-01 DIAGNOSIS — R1031 Right lower quadrant pain: Secondary | ICD-10-CM | POA: Insufficient documentation

## 2023-03-01 DIAGNOSIS — O034 Incomplete spontaneous abortion without complication: Secondary | ICD-10-CM | POA: Insufficient documentation

## 2023-03-01 DIAGNOSIS — O039 Complete or unspecified spontaneous abortion without complication: Secondary | ICD-10-CM | POA: Diagnosis not present

## 2023-03-01 DIAGNOSIS — O3680X0 Pregnancy with inconclusive fetal viability, not applicable or unspecified: Secondary | ICD-10-CM | POA: Diagnosis not present

## 2023-03-01 DIAGNOSIS — O209 Hemorrhage in early pregnancy, unspecified: Secondary | ICD-10-CM | POA: Diagnosis not present

## 2023-03-01 LAB — COMPREHENSIVE METABOLIC PANEL
ALT: 12 U/L (ref 0–44)
AST: 18 U/L (ref 15–41)
Albumin: 3.1 g/dL — ABNORMAL LOW (ref 3.5–5.0)
Alkaline Phosphatase: 58 U/L (ref 38–126)
Anion gap: 9 (ref 5–15)
BUN: 7 mg/dL (ref 6–20)
CO2: 23 mmol/L (ref 22–32)
Calcium: 8.7 mg/dL — ABNORMAL LOW (ref 8.9–10.3)
Chloride: 106 mmol/L (ref 98–111)
Creatinine, Ser: 0.73 mg/dL (ref 0.44–1.00)
GFR, Estimated: >60 mL/min (ref >60)
Glucose, Bld: 120 mg/dL — ABNORMAL HIGH (ref 70–99)
Potassium: 3.3 mmol/L — ABNORMAL LOW (ref 3.5–5.1)
Sodium: 138 mmol/L (ref 135–145)
Total Bilirubin: 0.1 mg/dL — ABNORMAL LOW (ref 0.3–1.2)
Total Protein: 6 g/dL — ABNORMAL LOW (ref 6.5–8.1)

## 2023-03-01 LAB — CBC
HCT: 26.8 % — ABNORMAL LOW (ref 36.0–46.0)
Hemoglobin: 8.6 g/dL — ABNORMAL LOW (ref 12.0–15.0)
MCH: 25.8 pg — ABNORMAL LOW (ref 26.0–34.0)
MCHC: 32.1 g/dL (ref 30.0–36.0)
MCV: 80.5 fL (ref 80.0–100.0)
Platelets: 199 10*3/uL (ref 150–400)
RBC: 3.33 MIL/uL — ABNORMAL LOW (ref 3.87–5.11)
RDW: 15 % (ref 11.5–15.5)
WBC: 7.9 10*3/uL (ref 4.0–10.5)
nRBC: 0 % (ref 0.0–0.2)

## 2023-03-01 LAB — URINALYSIS, ROUTINE W REFLEX MICROSCOPIC
Bilirubin Urine: NEGATIVE
Glucose, UA: NEGATIVE mg/dL
Ketones, ur: NEGATIVE mg/dL
Nitrite: NEGATIVE
Protein, ur: NEGATIVE mg/dL
RBC / HPF: >50 RBC/hpf (ref 0–5)
Specific Gravity, Urine: 1.017 (ref 1.005–1.030)
pH: 6 (ref 5.0–8.0)

## 2023-03-01 LAB — HCG, QUANTITATIVE, PREGNANCY: hCG, Beta Chain, Quant, S: 1041 m[IU]/mL — ABNORMAL HIGH (ref <5)

## 2023-03-01 LAB — TYPE AND SCREEN
ABO/RH(D): O POS
Antibody Screen: NEGATIVE

## 2023-03-01 LAB — HCG, SERUM, QUALITATIVE: Preg, Serum: POSITIVE — AB

## 2023-03-01 LAB — HEMOGLOBIN AND HEMATOCRIT, BLOOD
HCT: 29.4 % — ABNORMAL LOW (ref 36.0–46.0)
Hemoglobin: 9.2 g/dL — ABNORMAL LOW (ref 12.0–15.0)

## 2023-03-01 MED ORDER — TRANEXAMIC ACID-NACL 1000-0.7 MG/100ML-% IV SOLN
1000.0000 mg | INTRAVENOUS | Status: AC
  Administered 2023-03-01: 1000 mg via INTRAVENOUS
  Filled 2023-03-01: qty 100

## 2023-03-01 MED ORDER — TRANEXAMIC ACID 650 MG PO TABS: 1300.0000 mg | ORAL_TABLET | Freq: Three times a day (TID) | ORAL | 0 refills | Status: DC

## 2023-03-01 MED ORDER — ACETAMINOPHEN 500 MG PO TABS
1000.0000 mg | ORAL_TABLET | Freq: Once | ORAL | Status: DC
  Filled 2023-03-01: qty 2

## 2023-03-01 NOTE — Discharge Instructions (Addendum)
Thank you for coming to [facility] Emergency Department. You were seen for vaginal bleeding. We did an exam, labs, and imaging, and these showed likely an incomplete miscarriage.  Your hemoglobin was also low which is an indication of anemia likely from your vaginal bleeding.  You received IV medication in the emergency department to help you stop bleeding and we will prescribe Lysteda to take 3 times a day for 5 days.  You will be called by an obstetric scheduler to be seen ASAP this week.  Please follow up with an obstetrician soon as possible in the clinic.  If you begin to have worsening bleeding, soaking or saturating more than 2 pads an hour for 2 hours, please return to the ED at the [facility].  Do not hesitate to return to the ED or call 911 if you experience: -Worsening symptoms -Lightheadedness, passing out -Fevers/chills -Anything else that concerns you

## 2023-03-01 NOTE — ED Triage Notes (Addendum)
Patient arrives with complaints of vaginal bleeding. Patient states her cycle began on Tuesday with heavy bleeding that has been continuous. Patient states this is abnormal for her. Also endorses abdominal cramping, with pain rated 8/10. Patient states she is also lightheaded and dizzy.

## 2023-03-01 NOTE — ED Provider Notes (Signed)
[facility] [place] [facility] EMERGENCY DEPARTMENT AT [facility] Provider Note   CSN: [id number] Arrival date & time: 03/01/23  7829     History  Chief Complaint  Patient presents with   Vaginal Bleeding    Judy Ballard is a 22 y.o. female [id number] with PMH as listed below who presents with vaginal bleeding. Patient states her cycle began on Tuesday with heavy bleeding that has been continuous. Patient states this is abnormal for her. Also endorses abdominal cramping, with pain rated 8/10.  Never had cramping like this before.  Radiates into her mid lower back.  Patient states she is also lightheaded and dizzy and endorses 1 episode of loss of consciousness while at work this week.  She did not hit her head or have any seizure-like activity per her coworkers.  She denies any fever/chills, chest pain, shortness of breath, nausea vomiting.  Does endorse a couple of days of soft stool without any blood.  Denies any dysuria or flank pain.  She has never had a miscarriage that she is aware of but she has had 2 children birthed by C/S.  Per chart review is s/p lap choley in 09/2022 w/ E coli UTI. Was seen on 12/02/22 for R-sided abd pain and hematuria w/ frequency. Had CT renal stone study 12/02/02 w/ R-sided hydronephrosis. Per urology note from 01/11/23, "CT stone study demonstrated hydro on the right with high density material consistent with clot within the renal collecting system.  Laboratory studies at that time demonstrated normal renal function and urine culture grew 60,000 colonies of strep. The patient reports that the hematuria subsequently resolved." She took low dose nitrofurantoin suppression x 6 weeks per urology with plan for f/u 6 mo renal US.   Past Medical History:  Diagnosis Date   Cholelithiasis 10/04/2022   Pyelonephritis affecting pregnancy 09/10/2021   Right knee injury, initial encounter 08/10/2017   S/P tonsillectomy 07/16/2019   Tonsillitis    UTI (urinary tract infection)     UTI in pregnancy, antepartum, first trimester 09/10/2021       Home Medications Prior to Admission medications   Medication Sig Start Date End Date Taking? Authorizing Provider  tranexamic acid (LYSTEDA) 650 MG TABS tablet Take 2 tablets (1,300 mg total) by mouth 3 (three) times daily for 5 days. 03/01/23 03/06/23 Yes Loetta Rough, MD      Allergies    Apple juice, Apple, and Fish allergy    Review of Systems   Review of Systems A 10 point review of systems was performed and is negative unless otherwise reported in HPI.  Physical Exam Updated Vital Signs BP 112/84 (BP Location: Right Arm)   Pulse 88   Temp 98.4 F (36.9 C) (Oral)   Resp 16   Ht 5\' 3"  (1.6 m)   Wt 88.5 kg   LMP 02/27/2023   SpO2 98%   BMI 34.54 kg/m  Physical Exam General: Normal appearing female, lying in bed.  HEENT: Sclera anicteric, MMM, trachea midline.  Cardiology: RRR, no murmurs/rubs/gallops. BL radial and DP pulses equal bilaterally.  Resp: Normal respiratory rate and effort. CTAB, no wheezes, rhonchi, crackles.  Abd: TTP in RLQ/suprapubic region.  Soft, non-distended. No rebound tenderness or guarding.  Pelvic: Performed in presence of nurse chaperone. Normal appearing external genitalia. Moderate amount of blood in vaginal vault cleared by fox swabs. Small amount bleeding from cervical os. No CMT. Cervix dilated to fingertip.  MSK: No peripheral edema or signs of trauma. Extremities without deformity or  TTP. No cyanosis or clubbing. Skin: warm, dry.  Back: No CVA tenderness Neuro: A&Ox4, CNs II-XII grossly intact. MAEs. Sensation grossly intact.  Psych: Normal mood and affect.   ED Results / Procedures / Treatments   Labs (all labs ordered are listed, but only abnormal results are displayed) Labs Reviewed  HCG, SERUM, QUALITATIVE - Abnormal; Notable for the following components:      Result Value   Preg, Serum POSITIVE (*)    All other components within normal limits  CBC - Abnormal;  Notable for the following components:   RBC 3.33 (*)    Hemoglobin 8.6 (*)    HCT 26.8 (*)    MCH 25.8 (*)    All other components within normal limits  COMPREHENSIVE METABOLIC PANEL - Abnormal; Notable for the following components:   Potassium 3.3 (*)    Glucose, Bld 120 (*)    Calcium 8.7 (*)    Total Protein 6.0 (*)    Albumin 3.1 (*)    Total Bilirubin 0.1 (*)    All other components within normal limits  URINALYSIS, ROUTINE W REFLEX MICROSCOPIC - Abnormal; Notable for the following components:   APPearance CLOUDY (*)    Hgb urine dipstick LARGE (*)    Leukocytes,Ua MODERATE (*)    Bacteria, UA MANY (*)    All other components within normal limits  HCG, QUANTITATIVE, PREGNANCY - Abnormal; Notable for the following components:   hCG, Beta Chain, Quant, S 1,041 (*)    All other components within normal limits  HEMOGLOBIN AND HEMATOCRIT, BLOOD - Abnormal; Notable for the following components:   Hemoglobin 9.2 (*)    HCT 29.4 (*)    All other components within normal limits  URINE CULTURE  TYPE AND SCREEN    EKG None  Radiology US OB LESS THAN 14 WEEKS WITH OB TRANSVAGINAL  Result Date: 03/01/2023 CLINICAL DATA:  Vaginal bleeding. EXAM: OBSTETRIC <14 WK [place] AND TRANSVAGINAL OB US TECHNIQUE: Both transabdominal and transvaginal ultrasound examinations were performed for complete evaluation of the gestation as well as the maternal uterus, adnexal regions, and pelvic cul-de-sac. Transvaginal technique was performed to assess early pregnancy. COMPARISON:  August 2022 FINDINGS: Uterus: Uterus measures 11.5 x 4.9 by 7.1 cm.  Preserved myometrium. Endometrium: Heterogeneous endometrium identified with blood flow on Doppler. Thickness of the endometrium approaches 10 mm but there is some adjacent lobular heterogeneous material measuring 3.1 x 1.1 by 2.5 cm. Ovaries: Left ovary measures 3.5 x 2.5 by 2.2 cm. There is blood flow on color Doppler. Small follicles. The right ovary is best seen  transabdominal and measures 4.0 x 1.2 by 3.9 cm. Small follicle. Blood flow on color Doppler. Trace free fluid in the pelvis. IMPRESSION: No intrauterine pregnancy identified. Thickened heterogeneous endometrium with a lobular area measuring 3.1 x 2.5 x 1.1 cm with blood flow. Based on the positive pregnancy test in the history this could represent a spontaneous incomplete abortion with retained products. Trace free fluid in the pelvis. In principle however with a positive pregnancy test and no IUP, ectopic is difficult to completely exclude recommend close follow-up with serial beta HCG and ultrasound. Electronically Signed   By: Karen Kays M.D.   On: 03/01/2023 11:41    Procedures Pelvic exam  Date/Time: 03/01/2023 2:40 PM  Performed by: Loetta Rough, MD Authorized by: Loetta Rough, MD  Consent: Verbal consent obtained. Consent given by: patient Imaging studies: imaging studies available Patient identity confirmed: verbally with patient Time out: Immediately  prior to procedure a "time out" was called to verify the correct patient, procedure, equipment, support staff and site/side marked as required. Local anesthesia used: no  Anesthesia: Local anesthesia used: no  Sedation: Patient sedated: no  Patient tolerance: patient tolerated the procedure well with no immediate complications       Medications Ordered in ED Medications  acetaminophen (TYLENOL) tablet 1,000 mg (1,000 mg Oral Not Given 03/01/23 0820)  tranexamic acid (CYKLOKAPRON) IVPB 1,000 mg (1,000 mg Intravenous New Bag/Given 03/01/23 1409)    ED Course/ Medical Decision Making/ A&P                          Medical Decision Making Amount and/or Complexity of Data Reviewed Labs: ordered. Decision-making details documented in ED Course. Radiology: ordered. Decision-making details documented in ED Course.  Risk OTC drugs. Prescription drug management.    This patient presents to the ED for concern of abd pain,  vag bleeding; this involves an extensive number of treatment options, and is a complaint that carries with it a high risk of complications and morbidity.  I considered the following differential and admission for this acute, potentially life threatening condition.   MDM:    HCG qualitative is positive.  For vaginal bleeding in pregnancy <20 wks, consider: Ectopic pregnancy possible, will get [place] to assess for IUP. Complete vs incomplete vs inevitable vs missed vs threatened abortion. Low c/f Septic abortion w/ no f/c.  US shows IUP likely w/ incomplete abortion. Shows no gestational trophoblastic disease.  Considered RhoGam, but per chart review patient is O positive.   Pt with Hgb drop of 2 points. She is continuing to bleed. She states she does not have an OB in the area. Looks like per chart review she had seen [place] [facility] for her pregnancies. Will consult OB gyn for consult and to ensure prompt f/u with reevaluation of [place] and labwork.    Clinical Course as of 03/01/23 1440  Tue Mar 01, 2023  0805 Preg, Serum(!): POSITIVE HCG positive. Pending quantitative. [HN]  0810 Hemoglobin(!): 8.6 Hgb drop to 8.6 from 10.5 two months ago [HN]  0811 WBC: 7.9 No leukocytosis [HN]  0921 HCG, Beta Chain, Quant, S(!): 1,041 Normal for ~[redacted] weeks gestation [HN]  0921 Comprehensive metabolic panel(!) Unremarkable in the context of this patient's presentation  [HN]  1147 US OB LESS THAN 14 WEEKS WITH OB TRANSVAGINAL IMPRESSION: No intrauterine pregnancy identified.  Thickened heterogeneous endometrium with a lobular area measuring 3.1 x 2.5 x 1.1 cm with blood flow. Based on the positive pregnancy test in the history this could represent a spontaneous incomplete abortion with retained products. Trace free fluid in the pelvis.  In principle however with a positive pregnancy test and no IUP, ectopic is difficult to completely exclude recommend close follow-up with serial beta HCG  and ultrasound.   [HN]  1256 Pelvic exam w/ cervix 1 cm dilated, mild to moderate bleeding. Will consult to OB. [HN]  1313 D/w Dr. Vergie Living w/ OB who recommends TXA 1g IV, repeat H&H, and keeping patient NPO. Will call OB again after H&H results. [HN]  1424 Hgb stable. No need for PRBCs. Dr. Vergie Living in [place]. Will call back when done. [HN]  1439 Discussed with OB Dr. Vergie Living.  He recommends Lysteda 1300 mg 3 times daily x 5 days and has talked with the OB scheduler to have a D&C ASAP this week.  Patient will be called  by the OB to be scheduled the soonest possible in clinic this week.  He instructed me to tell the patient that if she needs to return to the ED for worsening bleeding she should go through the MAU and patient reports understanding.  Patient is given discharge instructions and return precautions, all questions answered to patient satisfaction. [HN]    Clinical Course User Index [HN] Loetta Rough, MD    Labs: I Ordered, and personally interpreted labs.  The pertinent results include:  those listed above  Imaging Studies ordered: I ordered imaging studies including US OB I independently visualized and interpreted imaging. I agree with the radiologist interpretation  Additional history obtained from chart review  Reevaluation: After the interventions noted above, I reevaluated the patient and found that they have :improved  Social Determinants of Health: Lives independently  Disposition:  DC w/ discharge instructions/return precautions. All questions answered to patient's satisfaction.    Co morbidities that complicate the patient evaluation  Past Medical History:  Diagnosis Date   Cholelithiasis 10/04/2022   Pyelonephritis affecting pregnancy 09/10/2021   Right knee injury, initial encounter 08/10/2017   S/P tonsillectomy 07/16/2019   Tonsillitis    UTI (urinary tract infection)    UTI in pregnancy, antepartum, first trimester 09/10/2021     Medicines Meds  ordered this encounter  Medications   acetaminophen (TYLENOL) tablet 1,000 mg   tranexamic acid (CYKLOKAPRON) IVPB 1,000 mg   tranexamic acid (LYSTEDA) 650 MG TABS tablet    Sig: Take 2 tablets (1,300 mg total) by mouth 3 (three) times daily for 5 days.    Dispense:  30 tablet    Refill:  0    I have reviewed the patients home medicines and have made adjustments as needed  Problem List / ED Course: Problem List Items Addressed This Visit   None Visit Diagnoses     Incomplete abortion    -  Primary   Vaginal bleeding       Anemia, unspecified type                       This note was created using dictation software, which may contain spelling or grammatical errors.    Loetta Rough, MD 03/01/23 1440

## 2023-03-01 NOTE — ED Notes (Signed)
There is a light green tube in the lab as well if needed

## 2023-03-02 ENCOUNTER — Other Ambulatory Visit: Payer: Self-pay

## 2023-03-02 ENCOUNTER — Encounter (HOSPITAL_COMMUNITY): Payer: Self-pay | Admitting: Obstetrics and Gynecology

## 2023-03-02 LAB — URINE CULTURE: Culture: <10000 — AB

## 2023-03-02 NOTE — Progress Notes (Signed)
Pt. Needs orders for upcoming procedure.

## 2023-03-03 ENCOUNTER — Other Ambulatory Visit: Payer: Self-pay | Admitting: Obstetrics and Gynecology

## 2023-03-03 ENCOUNTER — Telehealth: Payer: Self-pay

## 2023-03-03 NOTE — H&P (Signed)
Judy Ballard is an 22 y.o. P68 female here for scheduled dilation and evacuation due to retained products of conception. Patient presented to the ED due to heavy vaginal bleeding. She was noted to have a positive pregnancy test and ultrasound findings concerning for retained products of conception. Patient reports minimal vaginal bleeding since her ED visit   Menstrual History: Patient's last menstrual period was 02/27/2023.    Past Medical History:  Diagnosis Date   Anemia    Cholelithiasis 10/04/2022   Hypertension    Pyelonephritis affecting pregnancy 09/10/2021   Right knee injury, initial encounter 08/10/2017   S/P tonsillectomy 07/16/2019   Tonsillitis    UTI (urinary tract infection)    UTI in pregnancy, antepartum, first trimester 09/10/2021    Past Surgical History:  Procedure Laterality Date   CESAREAN SECTION N/A 10/05/2021   Procedure: CESAREAN SECTION;  Surgeon: Myna Hidalgo, DO;  Location: [facility];  Service: Obstetrics;  Laterality: N/A;  Stat C/S for non reassuring fetal heart tracing   CHOLECYSTECTOMY N/A 10/04/2022   Procedure: LAPAROSCOPIC CHOLECYSTECTOMY;  Surgeon: Violeta Gelinas, MD;  Location: [facility] OR;  Service: General;  Laterality: N/A;   TONSILLECTOMY Bilateral 07/16/2019   Procedure: TONSILLECTOMY;  Surgeon: Serena Colonel, MD;  Location: [facility];  Service: ENT;  Laterality: Bilateral;   UMBILICAL HERNIA REPAIR      History reviewed. No pertinent family history.  Social History:  reports that she has been smoking cigarettes. She has never used smokeless tobacco. She reports that she does not currently use drugs. She reports that she does not drink alcohol.  Allergies:  Allergies  Allergen Reactions   Apple Juice Hives and Swelling    Lip swelling   Apple Hives and Swelling    Lip swelling   Fish Allergy Hives    Specifically tilapia     Medications Prior to Admission  Medication Sig Dispense Refill Last Dose   tranexamic  acid (LYSTEDA) 650 MG TABS tablet Take 2 tablets (1,300 mg total) by mouth 3 (three) times daily for 5 days. 30 tablet 0     Review of Systems See pertinent in HPI. All other systems reviewed and non contributory Blood pressure 122/73, pulse 97, temperature 97.8 F (36.6 C), temperature source Oral, resp. rate 18, height 5\' 3"  (1.6 m), weight 88.5 kg, last menstrual period 02/27/2023, SpO2 99%, not currently breastfeeding. Physical Exam GENERAL: Well-developed, well-nourished female in no acute distress.  LUNGS: Clear to auscultation bilaterally.  HEART: Regular rate and rhythm. ABDOMEN: Soft, nontender, nondistended. No organomegaly. PELVIC: Deferred to OR EXTREMITIES: No cyanosis, clubbing, or edema, 2+ distal pulses.  No results found for this or any previous visit (from the past 24 hour(s)).  No results found. US OB LESS THAN 14 WEEKS WITH OB TRANSVAGINAL  Result Date: 03/01/2023 CLINICAL DATA:  Vaginal bleeding. EXAM: OBSTETRIC <14 WK [place] AND TRANSVAGINAL OB US TECHNIQUE: Both transabdominal and transvaginal ultrasound examinations were performed for complete evaluation of the gestation as well as the maternal uterus, adnexal regions, and pelvic cul-de-sac. Transvaginal technique was performed to assess early pregnancy. COMPARISON:  August 2022 FINDINGS: Uterus: Uterus measures 11.5 x 4.9 by 7.1 cm.  Preserved myometrium. Endometrium: Heterogeneous endometrium identified with blood flow on Doppler. Thickness of the endometrium approaches 10 mm but there is some adjacent lobular heterogeneous material measuring 3.1 x 1.1 by 2.5 cm. Ovaries: Left ovary measures 3.5 x 2.5 by 2.2 cm. There is blood flow on color Doppler. Small follicles. The right ovary  is best seen transabdominal and measures 4.0 x 1.2 by 3.9 cm. Small follicle. Blood flow on color Doppler. Trace free fluid in the pelvis. IMPRESSION: No intrauterine pregnancy identified. Thickened heterogeneous endometrium with a lobular area  measuring 3.1 x 2.5 x 1.1 cm with blood flow. Based on the positive pregnancy test in the history this could represent a spontaneous incomplete abortion with retained products. Trace free fluid in the pelvis. In principle however with a positive pregnancy test and no IUP, ectopic is difficult to completely exclude recommend close follow-up with serial beta HCG and ultrasound. Electronically Signed   By: Karen Kays M.D.   On: 03/01/2023 11:41    Assessment/Plan: 22 yo P1 with retained products of conception here for D&E - Risks, benefits and alternatives were explained including but not limited to risks of bleeding, infection , uterine perforation and damage to adjacent organs. Patient verbalized understanding and all questions were answered    03/04/2023, 1:01 PM

## 2023-03-03 NOTE — Anesthesia Preprocedure Evaluation (Addendum)
Anesthesia Evaluation  Patient identified by MRN, date of birth, ID band Patient awake    Reviewed: Allergy & Precautions, NPO status , Patient's Chart, lab work & pertinent test results  History of Anesthesia Complications Negative for: history of anesthetic complications  Airway Mallampati: III  TM Distance: >3 FB Neck ROM: Full    Dental  (+) Dental Advisory Given   Pulmonary neg shortness of breath, neg sleep apnea, neg COPD, neg recent URI, Current Smoker and Patient abstained from smoking.   Pulmonary exam normal breath sounds clear to auscultation       Cardiovascular hypertension, (-) angina (-) Past MI, (-) Cardiac Stents and (-) CABG (-) dysrhythmias  Rhythm:Regular Rate:Normal     Neuro/Psych  PSYCHIATRIC DISORDERS  Depression    negative neurological ROS     GI/Hepatic negative GI ROS, Neg liver ROS,,,  Endo/Other  negative endocrine ROS  obese  Renal/GU negative Renal ROS  negative genitourinary   Musculoskeletal negative musculoskeletal ROS (+)    Abdominal  (+) + obese  Peds  Hematology  (+) Blood dyscrasia, anemia Lab Results      Component                Value               Date                      WBC                      7.9                 03/01/2023                HGB                      9.2 (L)             03/01/2023                HCT                      29.4 (L)            03/01/2023                MCV                      80.5                03/01/2023                PLT                      199                 03/01/2023              Anesthesia Other Findings Missed Ab  Reproductive/Obstetrics                             Anesthesia Physical Anesthesia Plan  ASA: 2  Anesthesia Plan: General   Post-op Pain Management: Tylenol PO (pre-op)*   Induction: Intravenous  PONV Risk Score and Plan: 2 and Ondansetron, Dexamethasone and Midazolam  Airway  Management Planned: Oral ETT  Additional Equipment:   Intra-op Plan:   Post-operative Plan:  Extubation in OR  Informed Consent: I have reviewed the patients History and Physical, chart, labs and discussed the procedure including the risks, benefits and alternatives for the proposed anesthesia with the patient or authorized representative who has indicated his/her understanding and acceptance.     Dental advisory given  Plan Discussed with: CRNA and Anesthesiologist  Anesthesia Plan Comments: (Risks of general anesthesia discussed including, but not limited to, sore throat, hoarse voice, chipped/damaged teeth, injury to vocal cords, nausea and vomiting, allergic reactions, lung infection, heart attack, stroke, and death. All questions answered. )       Anesthesia Quick Evaluation

## 2023-03-03 NOTE — Telephone Encounter (Signed)
Called patient to confirm surgery details. Patient is aware she's scheduled on 03/04/23 at [facility] and has to arrive before 1:30 pm. Pre-Op instructions were provided over the phone.

## 2023-03-04 ENCOUNTER — Ambulatory Visit (HOSPITAL_COMMUNITY)
Admission: RE | Admit: 2023-03-04 | Discharge: 2023-03-04 | Disposition: A | Discharge status: Home / Self Care | Payer: Medicaid Other | Attending: Obstetrics and Gynecology | Admitting: Obstetrics and Gynecology

## 2023-03-04 ENCOUNTER — Other Ambulatory Visit: Payer: Self-pay

## 2023-03-04 ENCOUNTER — Encounter (HOSPITAL_COMMUNITY)
Admission: RE | Disposition: A | Discharge status: Home / Self Care | Payer: Self-pay | Source: Home / Self Care | Attending: Obstetrics and Gynecology

## 2023-03-04 ENCOUNTER — Encounter (HOSPITAL_COMMUNITY): Payer: Self-pay | Admitting: Obstetrics and Gynecology

## 2023-03-04 ENCOUNTER — Ambulatory Visit (HOSPITAL_COMMUNITY): Payer: Medicaid Other | Admitting: Anesthesiology

## 2023-03-04 ENCOUNTER — Ambulatory Visit (HOSPITAL_BASED_OUTPATIENT_CLINIC_OR_DEPARTMENT_OTHER): Payer: Medicaid Other | Admitting: Anesthesiology

## 2023-03-04 DIAGNOSIS — F17210 Nicotine dependence, cigarettes, uncomplicated: Secondary | ICD-10-CM | POA: Diagnosis not present

## 2023-03-04 DIAGNOSIS — Z3A10 10 weeks gestation of pregnancy: Secondary | ICD-10-CM | POA: Diagnosis not present

## 2023-03-04 DIAGNOSIS — O034 Incomplete spontaneous abortion without complication: Secondary | ICD-10-CM | POA: Diagnosis not present

## 2023-03-04 DIAGNOSIS — O021 Missed abortion: Secondary | ICD-10-CM

## 2023-03-04 DIAGNOSIS — Z3A00 Weeks of gestation of pregnancy not specified: Secondary | ICD-10-CM | POA: Diagnosis not present

## 2023-03-04 HISTORY — PX: DILATION AND EVACUATION: SHX1459

## 2023-03-04 HISTORY — DX: Anemia, unspecified: D64.9

## 2023-03-04 HISTORY — DX: Essential (primary) hypertension: I10

## 2023-03-04 SURGERY — DILATION AND EVACUATION, UTERUS
Anesthesia: General | Site: Vagina

## 2023-03-04 MED ORDER — LACTATED RINGERS IV SOLN: INTRAVENOUS | Status: DC

## 2023-03-04 MED ORDER — LIDOCAINE 2% (20 MG/ML) 5 ML SYRINGE
INTRAMUSCULAR | Status: DC | PRN
  Administered 2023-03-04: 100 mg via INTRAVENOUS

## 2023-03-04 MED ORDER — CHLORHEXIDINE GLUCONATE 0.12 % MT SOLN
OROMUCOSAL | Status: AC
  Administered 2023-03-04: 15 mL via OROMUCOSAL
  Filled 2023-03-04: qty 15

## 2023-03-04 MED ORDER — ORAL CARE MOUTH RINSE: 15.0000 mL | Freq: Once | OROMUCOSAL | Status: AC

## 2023-03-04 MED ORDER — PROPOFOL 10 MG/ML IV BOLUS
INTRAVENOUS | Status: DC | PRN
Start: 2023-03-04 — End: 2023-03-04
  Administered 2023-03-04: 200 mg via INTRAVENOUS

## 2023-03-04 MED ORDER — MIDAZOLAM HCL 2 MG/2ML IJ SOLN
INTRAMUSCULAR | Status: DC | PRN
  Administered 2023-03-04 (×2): 1 mg via INTRAVENOUS

## 2023-03-04 MED ORDER — ONDANSETRON HCL 4 MG/2ML IJ SOLN
INTRAMUSCULAR | Status: DC | PRN
  Administered 2023-03-04: 4 mg via INTRAVENOUS

## 2023-03-04 MED ORDER — DEXAMETHASONE SODIUM PHOSPHATE 10 MG/ML IJ SOLN
INTRAMUSCULAR | Status: DC | PRN
  Administered 2023-03-04: 8 mg via INTRAVENOUS

## 2023-03-04 MED ORDER — CHLOROPROCAINE HCL 1 % IJ SOLN
INTRAMUSCULAR | Status: AC
  Filled 2023-03-04: qty 30

## 2023-03-04 MED ORDER — IBUPROFEN 600 MG PO TABS: 600.0000 mg | ORAL_TABLET | Freq: Four times a day (QID) | ORAL | 3 refills | Status: DC | PRN

## 2023-03-04 MED ORDER — DOXYCYCLINE HYCLATE 100 MG IV SOLR
200.0000 mg | INTRAVENOUS | Status: AC
  Administered 2023-03-04: 200 mg via INTRAVENOUS
  Filled 2023-03-04: qty 200

## 2023-03-04 MED ORDER — OXYCODONE-ACETAMINOPHEN 5-325 MG PO TABS
1.0000 | ORAL_TABLET | Freq: Four times a day (QID) | ORAL | 0 refills | Status: DC | PRN
Start: 2023-03-04 — End: 2023-12-14

## 2023-03-04 MED ORDER — MIDAZOLAM HCL 2 MG/2ML IJ SOLN
INTRAMUSCULAR | Status: AC
  Filled 2023-03-04: qty 2

## 2023-03-04 MED ORDER — FENTANYL CITRATE (PF) 250 MCG/5ML IJ SOLN
INTRAMUSCULAR | Status: AC
  Filled 2023-03-04: qty 5

## 2023-03-04 MED ORDER — ACETAMINOPHEN 500 MG PO TABS: 1000.0000 mg | ORAL_TABLET | Freq: Once | ORAL | Status: AC

## 2023-03-04 MED ORDER — FENTANYL CITRATE (PF) 100 MCG/2ML IJ SOLN: 25.0000 ug | INTRAMUSCULAR | Status: DC | PRN

## 2023-03-04 MED ORDER — SUGAMMADEX SODIUM 200 MG/2ML IV SOLN
INTRAVENOUS | Status: DC | PRN
  Administered 2023-03-04: 177 mg via INTRAVENOUS

## 2023-03-04 MED ORDER — ACETAMINOPHEN 500 MG PO TABS
ORAL_TABLET | ORAL | Status: AC
  Administered 2023-03-04: 1000 mg via ORAL
  Filled 2023-03-04: qty 2

## 2023-03-04 MED ORDER — FENTANYL CITRATE (PF) 250 MCG/5ML IJ SOLN
INTRAMUSCULAR | Status: DC | PRN
  Administered 2023-03-04: 100 ug via INTRAVENOUS

## 2023-03-04 MED ORDER — POVIDONE-IODINE 10 % EX SWAB
2.0000 | Freq: Once | CUTANEOUS | Status: AC
  Administered 2023-03-04: 2 via TOPICAL

## 2023-03-04 MED ORDER — ROCURONIUM BROMIDE 10 MG/ML (PF) SYRINGE
PREFILLED_SYRINGE | INTRAVENOUS | Status: DC | PRN
  Administered 2023-03-04: 50 mg via INTRAVENOUS

## 2023-03-04 MED ORDER — CHLORHEXIDINE GLUCONATE 0.12 % MT SOLN: 15.0000 mL | Freq: Once | OROMUCOSAL | Status: AC

## 2023-03-04 MED ORDER — PROPOFOL 10 MG/ML IV BOLUS
INTRAVENOUS | Status: AC
  Filled 2023-03-04: qty 20

## 2023-03-04 MED ORDER — CHLOROPROCAINE HCL 1 % IJ SOLN
INTRAMUSCULAR | Status: DC | PRN
  Administered 2023-03-04: 10 mL

## 2023-03-04 SURGICAL SUPPLY — 22 items
CATH ROBINSON RED A/P 16FR (CATHETERS) ×1 IMPLANT
FILTER UTR ASPR ASSEMBLY (MISCELLANEOUS) ×1 IMPLANT
GLOVE BIOGEL PI IND STRL 6.5 (GLOVE) ×1 IMPLANT
GLOVE BIOGEL PI IND STRL 7.0 (GLOVE) ×1 IMPLANT
GLOVE SURG SS PI 6.5 STRL IVOR (GLOVE) ×1 IMPLANT
GOWN STRL REUS W/ TWL LRG LVL3 (GOWN DISPOSABLE) ×2 IMPLANT
GOWN STRL REUS W/TWL LRG LVL3 (GOWN DISPOSABLE) ×2
HOSE CONNECTING 18IN BERKELEY (TUBING) ×1 IMPLANT
KIT BERKELEY 1ST TRI 3/8 NO TR (MISCELLANEOUS) ×1 IMPLANT
KIT BERKELEY 1ST TRIMESTER 3/8 (MISCELLANEOUS) ×1 IMPLANT
NS IRRIG 1000ML POUR BTL (IV SOLUTION) ×1 IMPLANT
PACK VAGINAL MINOR WOMEN LF (CUSTOM PROCEDURE TRAY) ×1 IMPLANT
PAD OB MATERNITY 4.3X12.25 (PERSONAL CARE ITEMS) ×1 IMPLANT
SET BERKELEY SUCTION TUBING (SUCTIONS) ×1 IMPLANT
SPIKE FLUID TRANSFER (MISCELLANEOUS) ×1 IMPLANT
TOWEL GREEN STERILE FF (TOWEL DISPOSABLE) ×2 IMPLANT
UNDERPAD 30X36 HEAVY ABSORB (UNDERPADS AND DIAPERS) ×1 IMPLANT
VACURETTE 10 RIGID CVD (CANNULA) IMPLANT
VACURETTE 6 ASPIR F TIP BERK (CANNULA) IMPLANT
VACURETTE 7MM CVD STRL WRAP (CANNULA) IMPLANT
VACURETTE 8 RIGID CVD (CANNULA) IMPLANT
VACURETTE 9 RIGID CVD (CANNULA) IMPLANT

## 2023-03-04 NOTE — Progress Notes (Signed)
Patient placed in Phase 2. Awaiting IV Doxycycline to finish infusing.

## 2023-03-04 NOTE — Op Note (Signed)
Judy Ballard PROCEDURE DATE: 03/04/2023  PREOPERATIVE DIAGNOSIS: Retained products of conception following a miscarriage. POSTOPERATIVE DIAGNOSIS: The same. PROCEDURE:     Dilation and Evacuation. SURGEON:  Dr. Catalina Antigua  INDICATIONS: 22 y.o. [id number] with retained products of conception, needing surgical completion.  Risks of surgery were discussed with the patient including but not limited to: bleeding which may require transfusion; infection which may require antibiotics; injury to uterus or surrounding organs;need for additional procedures including laparotomy or laparoscopy; possibility of intrauterine scarring which may impair future fertility; and other postoperative/anesthesia complications. Written informed consent was obtained.    FINDINGS:  A 10-week size anteverted uterus, moderate amounts of products of conception, specimen sent to pathology.  ANESTHESIA:    Monitored intravenous sedation, paracervical block. INTRAVENOUS FLUIDS:  500 ml of LR ESTIMATED BLOOD LOSS:  Less than 20 ml. SPECIMENS:  Products of conception sent to pathology COMPLICATIONS:  None immediate.  PROCEDURE DETAILS:  The patient received intravenous antibiotics while in the preoperative area.  She was then taken to the operating room where general anesthesia was administered and was found to be adequate.  After an adequate timeout was performed, she was placed in the dorsal lithotomy position and examined; then prepped and draped in the sterile manner.   Her bladder was catheterized for an unmeasured amount of clear, yellow urine. A vaginal speculum was then placed in the patient's vagina and a single tooth tenaculum was applied to the anterior lip of the cervix.  A paracervical block using 0.5% Marcaine was administered. The cervix was gently dilated to accommodate a 10 mm suction curette that was gently advanced to the uterine fundus.  The suction device was then activated and curette slowly rotated to  clear the uterus of products of conception.  A sharp curettage was then performed to confirm complete emptying of the uterus. There was minimal bleeding noted and the tenaculum removed with good hemostasis noted.   All instruments were removed from the patient's vagina. The patient tolerated the procedure well and was taken to the recovery area awake, and in stable condition.  The patient will be discharged to home as per PACU criteria.  Routine postoperative instructions given.  She will follow up in the clinic in 2 weeks for postoperative evaluation.

## 2023-03-04 NOTE — Anesthesia Postprocedure Evaluation (Signed)
Anesthesia Post Note  Patient: Judy Ballard  Procedure(s) Performed: DILATATION AND EVACUATION (Vagina )     Patient location during evaluation: PACU Anesthesia Type: General Level of consciousness: awake Pain management: pain level controlled Vital Signs Assessment: post-procedure vital signs reviewed and stable Respiratory status: spontaneous breathing, nonlabored ventilation and respiratory function stable Cardiovascular status: blood pressure returned to baseline and stable Postop Assessment: no apparent nausea or vomiting Anesthetic complications: no   No notable events documented.  Last Vitals:  Vitals:   03/04/23 1445 03/04/23 1530  BP: (!) 97/59 107/64  Pulse: 75 85  Resp: (!) 26 20  Temp: 36.6 C   SpO2: 96% 98%    Last Pain:  Vitals:   03/04/23 1400  TempSrc:   PainSc: 0-No pain                 Linton Rump

## 2023-03-04 NOTE — Anesthesia Procedure Notes (Signed)
Procedure Name: Intubation Date/Time: 03/04/2023 1:26 PM  Performed by: Marena Chancy, CRNAPre-anesthesia Checklist: Patient identified, Emergency Drugs available, Suction available and Patient being monitored Patient Re-evaluated:Patient Re-evaluated prior to induction Oxygen Delivery Method: Circle System Utilized Preoxygenation: Pre-oxygenation with 100% oxygen Induction Type: IV induction and Cricoid Pressure applied Ventilation: Mask ventilation without difficulty Laryngoscope Size: Mac and 3 Grade View: Grade I Tube type: Oral Tube size: 7.0 mm Number of attempts: 1 Airway Equipment and Method: Stylet and Oral airway Placement Confirmation: ETT inserted through vocal cords under direct vision, positive ETCO2 and breath sounds checked- equal and bilateral Tube secured with: Tape Dental Injury: Teeth and Oropharynx as per pre-operative assessment

## 2023-03-04 NOTE — Transfer of Care (Signed)
Immediate Anesthesia Transfer of Care Note  Patient: CINCERE HEATH  Procedure(s) Performed: DILATATION AND EVACUATION (Vagina )  Patient Location: PACU  Anesthesia Type:General  Level of Consciousness: awake, alert , and oriented  Airway & Oxygen Therapy: Patient Spontanous Breathing and Patient connected to nasal cannula oxygen  Post-op Assessment: Report given to RN and Post -op Vital signs reviewed and stable  Post vital signs: Reviewed and stable  Last Vitals:  Vitals Value Taken Time  BP 115/76 03/04/23 1400  Temp    Pulse 102 03/04/23 1403  Resp 31 03/04/23 1403  SpO2 96 % 03/04/23 1403  Vitals shown include unfiled device data.  Last Pain:  Vitals:   03/04/23 1400  TempSrc:   PainSc: 0-No pain         Complications: No notable events documented.

## 2023-03-05 ENCOUNTER — Encounter (HOSPITAL_COMMUNITY): Payer: Self-pay | Admitting: Obstetrics and Gynecology

## 2023-03-21 ENCOUNTER — Encounter: Payer: Medicaid Other | Admitting: Obstetrics and Gynecology

## 2023-05-24 ENCOUNTER — Telehealth (HOSPITAL_BASED_OUTPATIENT_CLINIC_OR_DEPARTMENT_OTHER): Payer: Self-pay

## 2023-06-09 ENCOUNTER — Telehealth (HOSPITAL_BASED_OUTPATIENT_CLINIC_OR_DEPARTMENT_OTHER): Payer: Self-pay

## 2023-07-13 ENCOUNTER — Ambulatory Visit: Payer: Medicaid Other | Admitting: Urology

## 2023-08-24 DIAGNOSIS — Z0000 Encounter for general adult medical examination without abnormal findings: Secondary | ICD-10-CM | POA: Diagnosis not present

## 2023-08-24 DIAGNOSIS — E669 Obesity, unspecified: Secondary | ICD-10-CM | POA: Diagnosis not present

## 2023-08-24 DIAGNOSIS — E785 Hyperlipidemia, unspecified: Secondary | ICD-10-CM | POA: Diagnosis not present

## 2023-08-24 DIAGNOSIS — M79641 Pain in right hand: Secondary | ICD-10-CM | POA: Diagnosis not present

## 2023-08-24 DIAGNOSIS — M79642 Pain in left hand: Secondary | ICD-10-CM | POA: Diagnosis not present

## 2023-08-24 DIAGNOSIS — Z13220 Encounter for screening for lipoid disorders: Secondary | ICD-10-CM | POA: Diagnosis not present

## 2023-08-24 DIAGNOSIS — Z131 Encounter for screening for diabetes mellitus: Secondary | ICD-10-CM | POA: Diagnosis not present

## 2023-09-08 DIAGNOSIS — M79641 Pain in right hand: Secondary | ICD-10-CM | POA: Diagnosis not present

## 2023-09-08 DIAGNOSIS — D649 Anemia, unspecified: Secondary | ICD-10-CM | POA: Diagnosis not present

## 2023-09-08 DIAGNOSIS — E785 Hyperlipidemia, unspecified: Secondary | ICD-10-CM | POA: Diagnosis not present

## 2023-09-08 DIAGNOSIS — R7303 Prediabetes: Secondary | ICD-10-CM | POA: Diagnosis not present

## 2023-09-08 DIAGNOSIS — M79642 Pain in left hand: Secondary | ICD-10-CM | POA: Diagnosis not present

## 2023-09-08 DIAGNOSIS — R768 Other specified abnormal immunological findings in serum: Secondary | ICD-10-CM | POA: Diagnosis not present

## 2023-09-08 DIAGNOSIS — E669 Obesity, unspecified: Secondary | ICD-10-CM | POA: Diagnosis not present

## 2023-12-14 ENCOUNTER — Ambulatory Visit (HOSPITAL_COMMUNITY): Payer: Self-pay | Admitting: Physician Assistant

## 2023-12-14 ENCOUNTER — Encounter (HOSPITAL_COMMUNITY): Payer: Self-pay | Admitting: Emergency Medicine

## 2023-12-14 ENCOUNTER — Ambulatory Visit (HOSPITAL_COMMUNITY)
Admission: EM | Admit: 2023-12-14 | Discharge: 2023-12-14 | Disposition: A | Discharge status: Home / Self Care | Attending: Physician Assistant | Admitting: Physician Assistant

## 2023-12-14 ENCOUNTER — Ambulatory Visit (INDEPENDENT_AMBULATORY_CARE_PROVIDER_SITE_OTHER)

## 2023-12-14 DIAGNOSIS — R051 Acute cough: Secondary | ICD-10-CM

## 2023-12-14 DIAGNOSIS — J40 Bronchitis, not specified as acute or chronic: Secondary | ICD-10-CM | POA: Diagnosis not present

## 2023-12-14 DIAGNOSIS — J4521 Mild intermittent asthma with (acute) exacerbation: Secondary | ICD-10-CM

## 2023-12-14 DIAGNOSIS — R059 Cough, unspecified: Secondary | ICD-10-CM | POA: Diagnosis not present

## 2023-12-14 DIAGNOSIS — R062 Wheezing: Secondary | ICD-10-CM | POA: Diagnosis not present

## 2023-12-14 LAB — POC SARS CORONAVIRUS 2 AG -  ED: SARS Coronavirus 2 Ag: NEGATIVE

## 2023-12-14 MED ORDER — BUDESONIDE-FORMOTEROL FUMARATE 80-4.5 MCG/ACT IN AERO: 2.0000 | INHALATION_SPRAY | Freq: Two times a day (BID) | RESPIRATORY_TRACT | 1 refills | Status: AC

## 2023-12-14 MED ORDER — METHYLPREDNISOLONE SODIUM SUCC 125 MG IJ SOLR
60.0000 mg | Freq: Once | INTRAMUSCULAR | Status: AC
  Administered 2023-12-14: 60 mg via INTRAMUSCULAR

## 2023-12-14 MED ORDER — METHYLPREDNISOLONE SODIUM SUCC 125 MG IJ SOLR
INTRAMUSCULAR | Status: AC
  Filled 2023-12-14: qty 2

## 2023-12-14 MED ORDER — IPRATROPIUM-ALBUTEROL 0.5-2.5 (3) MG/3ML IN SOLN
3.0000 mL | Freq: Once | RESPIRATORY_TRACT | Status: AC
  Administered 2023-12-14: 3 mL via RESPIRATORY_TRACT

## 2023-12-14 MED ORDER — PROMETHAZINE-DM 6.25-15 MG/5ML PO SYRP
5.0000 mL | ORAL_SOLUTION | Freq: Two times a day (BID) | ORAL | 0 refills | Status: AC | PRN
Start: 2023-12-14 — End: ?

## 2023-12-14 MED ORDER — IPRATROPIUM-ALBUTEROL 0.5-2.5 (3) MG/3ML IN SOLN
RESPIRATORY_TRACT | Status: AC
  Filled 2023-12-14: qty 3

## 2023-12-14 MED ORDER — ALBUTEROL SULFATE HFA 108 (90 BASE) MCG/ACT IN AERS: 1.0000 | INHALATION_SPRAY | Freq: Four times a day (QID) | RESPIRATORY_TRACT | 0 refills | Status: AC | PRN

## 2023-12-14 MED ORDER — PREDNISONE 10 MG (21) PO TBPK: ORAL_TABLET | ORAL | 0 refills | Status: AC

## 2023-12-14 NOTE — ED Triage Notes (Signed)
 Pt reports had dry cough since Saturday and her chest will hurt when she coughs or takes a deep breath. Hasn't taken any medications for cough.

## 2023-12-14 NOTE — Discharge Instructions (Addendum)
 I did not see any evidence of pneumonia on your x-ray.  I am concerned about an asthma exacerbation.  Please use albuterol  every 4-6 hours as needed.  Start Symbicort twice daily.  Rinse your mouth following use of this medication to prevent thrush.  We gave an injection of steroids today so please start prednisone  tomorrow (12/15/2023).  Do not take NSAIDs with this medication including aspirin, ibuprofen /Advil , naproxen /Aleve .  Make sure that you rest and drink plenty of fluid.  You can use Tylenol , Mucinex, Flonase for additional symptom relief.  I have prescribed Promethazine  DM for cough.  This will make you sleepy so do not drive or drink alcohol with taking it.  If you are not feeling better within 3 to 5 days or if anything worsens and you have worsening cough, fever, shortness of breath, chest pain, nausea/vomiting interfere with oral intake you need to be seen immediately.

## 2023-12-14 NOTE — ED Provider Notes (Signed)
 [facility]    CSN: [id number] Arrival date & time: 12/14/23  4098      History   Chief Complaint Chief Complaint  Patient presents with   Cough    HPI Judy Ballard is a 23 y.o. female.   Patient presents today with a 4-day history of URI symptoms including dry cough, burning in her chest, shortness of breath, mild congestion.  She denies any fever, chest pain, nausea, vomiting, dizziness, headache, sore throat.  She has not tried any over-the-counter medication for symptom management.  Denies any known sick contacts.  She has not had COVID in the past.  She has had COVID vaccines.  Denies any recent antibiotics or steroids.  Mother reports that she had a history of asthma when she was much younger but never required hospitalization.  She has not used any bronchodilator therapy recently.  She is confident that she is not pregnant.  She is having difficulty with daily activities as a result of symptoms.    Past Medical History:  Diagnosis Date   Anemia    Cholelithiasis 10/04/2022   Hypertension    Pyelonephritis affecting pregnancy 09/10/2021   Right knee injury, initial encounter 08/10/2017   S/P tonsillectomy 07/16/2019   Tonsillitis    UTI (urinary tract infection)    UTI in pregnancy, antepartum, first trimester 09/10/2021    Patient Active Problem List   Diagnosis Date Noted   Retained products of conception, early pregnancy 03/04/2023   Incomplete spontaneous abortion 03/01/2023   History of gestational diabetes in prior pregnancy, currently pregnant in third trimester 10/04/2021   Anemia affecting pregnancy in third trimester 09/10/2021   Short interval between pregnancies affecting pregnancy in third trimester, antepartum 09/10/2021   Depression affecting pregnancy in third trimester, antepartum 08/14/2021   History of gestational hypertension 08/13/2020    Past Surgical History:  Procedure Laterality Date   CESAREAN SECTION N/A 10/05/2021    Procedure: CESAREAN SECTION;  Surgeon: Ozan, Jennifer, DO;  Location: [facility];  Service: Obstetrics;  Laterality: N/A;  Stat C/S for non reassuring fetal heart tracing   CHOLECYSTECTOMY N/A 10/04/2022   Procedure: LAPAROSCOPIC CHOLECYSTECTOMY;  Surgeon: Dorena Gander, MD;  Location: [facility] OR;  Service: General;  Laterality: N/A;   DILATION AND EVACUATION N/A 03/04/2023   Procedure: DILATATION AND EVACUATION;  Surgeon: Verlyn Goad, MD;  Location: [facility];  Service: Gynecology;  Laterality: N/A;   TONSILLECTOMY Bilateral 07/16/2019   Procedure: TONSILLECTOMY;  Surgeon: Janita Mellow, MD;  Location: [facility];  Service: ENT;  Laterality: Bilateral;   UMBILICAL HERNIA REPAIR      OB History     Gravida  2   Para  2   Term  2   Preterm      AB      Living  2      SAB      IAB      Ectopic      Multiple  0   Live Births  2            Home Medications    Prior to Admission medications   Medication Sig Start Date End Date Taking? Authorizing Provider  albuterol  (VENTOLIN  HFA) 108 (90 Base) MCG/ACT inhaler Inhale 1-2 puffs into the lungs every 6 (six) hours as needed for wheezing or shortness of breath. 12/14/23  Yes Harm Jou K, PA-C  budesonide-formoterol (SYMBICORT) 80-4.5 MCG/ACT inhaler Inhale 2 puffs into the lungs in the morning and at bedtime.  12/14/23  Yes Katti Pelle K, PA-C  predniSONE  (STERAPRED UNI-PAK 21 TAB) 10 MG (21) TBPK tablet As directed 12/14/23  Yes Natalin Bible K, PA-C  promethazine -dextromethorphan (PROMETHAZINE -DM) 6.25-15 MG/5ML syrup Take 5 mLs by mouth 2 (two) times daily as needed for cough. 12/14/23  Yes Mayela Bullard, Betsey Brow, PA-C    Family History History reviewed. No pertinent family history.  Social History Social History   Tobacco Use   Smoking status: Every Day    Current packs/day: 0.25    Types: Cigarettes   Smokeless tobacco: Never  Vaping Use   Vaping status: Never Used  Substance Use Topics   Alcohol use: No    Drug use: Not Currently     Allergies   Apple juice, Apple, and Fish allergy   Review of Systems Review of Systems  Constitutional:  Positive for activity change. Negative for appetite change, fatigue and fever.  HENT:  Positive for congestion. Negative for sinus pressure, sneezing and sore throat.   Respiratory:  Positive for cough, chest tightness, shortness of breath and wheezing.   Cardiovascular:  Negative for chest pain.  Gastrointestinal:  Negative for abdominal pain, diarrhea, nausea and vomiting.  Neurological:  Negative for dizziness, light-headedness and headaches.     Physical Exam Triage Vital Signs ED Triage Vitals  Encounter Vitals Group     BP 12/14/23 1011 111/76     Systolic BP Percentile --      Diastolic BP Percentile --      Pulse Rate 12/14/23 1011 79     Resp 12/14/23 1011 15     Temp 12/14/23 1011 98.5 F (36.9 C)     Temp Source 12/14/23 1011 Oral     SpO2 12/14/23 1011 95 %     Weight --      Height --      Head Circumference --      Peak Flow --      Pain Score 12/14/23 1010 7     Pain Loc --      Pain Education --      Exclude from Growth Chart --    No data found.  Updated Vital Signs BP 111/76 (BP Location: Right Arm)   Pulse 79   Temp 98.5 F (36.9 C) (Oral)   Resp 15   SpO2 95%   Visual Acuity Right Eye Distance:   Left Eye Distance:   Bilateral Distance:    Right Eye Near:   Left Eye Near:    Bilateral Near:     Physical Exam Vitals reviewed.  Constitutional:      General: She is awake. She is not in acute distress.    Appearance: Normal appearance. She is well-developed. She is not ill-appearing.     Comments: Very pleasant female appears stated age in no acute distress sitting comfortably in exam room  HENT:     Head: Normocephalic and atraumatic.     Right Ear: Tympanic membrane, ear canal and external ear normal. Tympanic membrane is not erythematous or bulging.     Left Ear: Tympanic membrane, ear canal and  external ear normal. Tympanic membrane is not erythematous or bulging.     Nose:     Right Sinus: No maxillary sinus tenderness or frontal sinus tenderness.     Left Sinus: No maxillary sinus tenderness or frontal sinus tenderness.     Mouth/Throat:     Pharynx: Uvula midline. No oropharyngeal exudate or posterior oropharyngeal erythema.  Cardiovascular:  Rate and Rhythm: Normal rate and regular rhythm.     Heart sounds: Normal heart sounds, S1 normal and S2 normal. No murmur heard. Pulmonary:     Effort: Pulmonary effort is normal.     Breath sounds: Examination of the left-upper field reveals wheezing. Wheezing present. No rhonchi or rales.     Comments: Wheezing scattered throughout lung fields but worse in left upper lobe Psychiatric:        Behavior: Behavior is cooperative.      UC Treatments / Results  Labs (all labs ordered are listed, but only abnormal results are displayed) Labs Reviewed  POC SARS CORONAVIRUS 2 AG -  ED    EKG   Radiology No results found.  Procedures Procedures (including critical care time)  Medications Ordered in UC Medications  ipratropium-albuterol  (DUONEB) 0.5-2.5 (3) MG/3ML nebulizer solution 3 mL (3 mLs Nebulization Given 12/14/23 1054)  methylPREDNISolone  sodium succinate (SOLU-MEDROL ) 125 mg/2 mL injection 60 mg (60 mg Intramuscular Given 12/14/23 1051)    Initial Impression / Assessment and Plan / UC Course  I have reviewed the triage vital signs and the nursing notes.  Pertinent labs & imaging results that were available during my care of the patient were reviewed by me and considered in my medical decision making (see chart for details).     Patient is well-appearing, afebrile, nontoxic, nontachycardic.  She had widespread wheezing improved following DuoNeb and Solu-Medrol  in clinic and reportedly felt much better after these medications.  She is negative for COVID.  No evidence of acute infection on physical exam that warrant  initiation of antibiotics.  Chest x-ray was obtained that showed peribronchial thickening concerning for reactive airway disease versus bronchitis.  No focal consolidation based on my primary read but we will waiting for radiologist overread at the time of discharge.  We will contact her if this differs and changes her treatment plan.  Will treat for asthma exacerbation given history of asthma when she was a child.  She was sent home with albuterol  inhaler to be used every 4-6 hours as needed will start Symbicort twice daily.  Discussed that she is to rinse her mouth following use of medication to prevent thrush.  She will start prednisone  tomorrow (12/15/2023) we discussed that she is not to take NSAIDs with this medication.  She can use over-the-counter medication including Mucinex, Flonase, Tylenol  for symptom relief.  She was given Promethazine  DM for cough but we discussed that this can be sedating and she is not to drive or drink alcohol with taking it.  Recommend close follow-up with her primary care if symptoms do not resolve with medication regimen.  We discussed that if anything worsens she needs to go to the ER.  Strict return precautions given.  Excuse note provided.  Final Clinical Impressions(s) / UC Diagnoses   Final diagnoses:  Acute cough  Mild intermittent asthma with acute exacerbation     Discharge Instructions      I did not see any evidence of pneumonia on your x-ray.  I am concerned about an asthma exacerbation.  Please use albuterol  every 4-6 hours as needed.  Start Symbicort twice daily.  Rinse your mouth following use of this medication to prevent thrush.  We gave an injection of steroids today so please start prednisone  tomorrow (12/15/2023).  Do not take NSAIDs with this medication including aspirin, ibuprofen /Advil , naproxen /Aleve .  Make sure that you rest and drink plenty of fluid.  You can use Tylenol , Mucinex, Flonase for additional  symptom relief.  I have prescribed  Promethazine  DM for cough.  This will make you sleepy so do not drive or drink alcohol with taking it.  If you are not feeling better within 3 to 5 days or if anything worsens and you have worsening cough, fever, shortness of breath, chest pain, nausea/vomiting interfere with oral intake you need to be seen immediately.   ED Prescriptions     Medication Sig Dispense Auth. Provider   budesonide-formoterol (SYMBICORT) 80-4.5 MCG/ACT inhaler Inhale 2 puffs into the lungs in the morning and at bedtime. 1 each Viviene Thurston K, PA-C   albuterol  (VENTOLIN  HFA) 108 (90 Base) MCG/ACT inhaler Inhale 1-2 puffs into the lungs every 6 (six) hours as needed for wheezing or shortness of breath. 18 g Mersadies Petree K, PA-C   predniSONE  (STERAPRED UNI-PAK 21 TAB) 10 MG (21) TBPK tablet As directed 21 tablet Damari Suastegui K, PA-C   promethazine -dextromethorphan (PROMETHAZINE -DM) 6.25-15 MG/5ML syrup Take 5 mLs by mouth 2 (two) times daily as needed for cough. 118 mL Janae Bonser K, PA-C      PDMP not reviewed this encounter.   Budd Cargo, PA-C 12/14/23 1132

## 2024-05-26 ENCOUNTER — Other Ambulatory Visit: Payer: Self-pay

## 2024-05-26 ENCOUNTER — Encounter (HOSPITAL_COMMUNITY): Payer: Self-pay

## 2024-05-26 ENCOUNTER — Emergency Department (HOSPITAL_COMMUNITY)

## 2024-05-26 ENCOUNTER — Emergency Department (HOSPITAL_COMMUNITY)
Admission: EM | Admit: 2024-05-26 | Discharge: 2024-05-26 | Disposition: A | Discharge status: Home / Self Care | Attending: Emergency Medicine | Admitting: Emergency Medicine

## 2024-05-26 DIAGNOSIS — N3001 Acute cystitis with hematuria: Secondary | ICD-10-CM | POA: Insufficient documentation

## 2024-05-26 DIAGNOSIS — R197 Diarrhea, unspecified: Secondary | ICD-10-CM | POA: Insufficient documentation

## 2024-05-26 DIAGNOSIS — R109 Unspecified abdominal pain: Secondary | ICD-10-CM | POA: Diagnosis not present

## 2024-05-26 DIAGNOSIS — Z9049 Acquired absence of other specified parts of digestive tract: Secondary | ICD-10-CM | POA: Diagnosis not present

## 2024-05-26 DIAGNOSIS — I10 Essential (primary) hypertension: Secondary | ICD-10-CM | POA: Insufficient documentation

## 2024-05-26 DIAGNOSIS — R112 Nausea with vomiting, unspecified: Secondary | ICD-10-CM | POA: Insufficient documentation

## 2024-05-26 LAB — COMPREHENSIVE METABOLIC PANEL WITH GFR
ALT: 14 U/L (ref 0–44)
AST: 20 U/L (ref 15–41)
Albumin: 3.5 g/dL (ref 3.5–5.0)
Alkaline Phosphatase: 71 U/L (ref 38–126)
Anion gap: 11 (ref 5–15)
BUN: 10 mg/dL (ref 6–20)
CO2: 22 mmol/L (ref 22–32)
Calcium: 8.9 mg/dL (ref 8.9–10.3)
Chloride: 104 mmol/L (ref 98–111)
Creatinine, Ser: 0.66 mg/dL (ref 0.44–1.00)
GFR, Estimated: >60 mL/min (ref >60)
Glucose, Bld: 120 mg/dL — ABNORMAL HIGH (ref 70–99)
Potassium: 3.8 mmol/L (ref 3.5–5.1)
Sodium: 137 mmol/L (ref 135–145)
Total Bilirubin: 0.5 mg/dL (ref 0.0–1.2)
Total Protein: 7.1 g/dL (ref 6.5–8.1)

## 2024-05-26 LAB — URINALYSIS, ROUTINE W REFLEX MICROSCOPIC
Bilirubin Urine: NEGATIVE
Glucose, UA: NEGATIVE mg/dL
Ketones, ur: NEGATIVE mg/dL
Nitrite: POSITIVE — AB
Protein, ur: 100 mg/dL — AB
RBC / HPF: >50 RBC/hpf (ref 0–5)
Specific Gravity, Urine: 1.016 (ref 1.005–1.030)
WBC, UA: >50 WBC/hpf (ref 0–5)
pH: 6 (ref 5.0–8.0)

## 2024-05-26 LAB — CBC
HCT: 39.9 % (ref 36.0–46.0)
Hemoglobin: 13.3 g/dL (ref 12.0–15.0)
MCH: 27.3 pg (ref 26.0–34.0)
MCHC: 33.3 g/dL (ref 30.0–36.0)
MCV: 81.9 fL (ref 80.0–100.0)
Platelets: 204 K/uL (ref 150–400)
RBC: 4.87 MIL/uL (ref 3.87–5.11)
RDW: 14 % (ref 11.5–15.5)
WBC: 9.5 K/uL (ref 4.0–10.5)
nRBC: 0 % (ref 0.0–0.2)

## 2024-05-26 LAB — HCG, SERUM, QUALITATIVE: Preg, Serum: NEGATIVE

## 2024-05-26 LAB — LIPASE, BLOOD: Lipase: 25 U/L (ref 11–51)

## 2024-05-26 MED ORDER — MORPHINE SULFATE (PF) 4 MG/ML IV SOLN
4.0000 mg | Freq: Once | INTRAVENOUS | Status: AC
  Administered 2024-05-26: 4 mg via INTRAVENOUS
  Filled 2024-05-26: qty 1

## 2024-05-26 MED ORDER — CEPHALEXIN 500 MG PO CAPS: 500.0000 mg | ORAL_CAPSULE | Freq: Two times a day (BID) | ORAL | 0 refills | Status: AC

## 2024-05-26 MED ORDER — ONDANSETRON 4 MG PO TBDP: 4.0000 mg | ORAL_TABLET | Freq: Three times a day (TID) | ORAL | 0 refills | Status: AC | PRN

## 2024-05-26 MED ORDER — PHENAZOPYRIDINE HCL 200 MG PO TABS: 200.0000 mg | ORAL_TABLET | Freq: Three times a day (TID) | ORAL | 0 refills | Status: AC

## 2024-05-26 MED ORDER — SODIUM CHLORIDE 0.9 % IV BOLUS
1000.0000 mL | Freq: Once | INTRAVENOUS | Status: AC
  Administered 2024-05-26: 1000 mL via INTRAVENOUS

## 2024-05-26 MED ORDER — ONDANSETRON HCL 4 MG/2ML IJ SOLN
4.0000 mg | Freq: Once | INTRAMUSCULAR | Status: AC
Start: 2024-05-26 — End: 2024-05-26
  Administered 2024-05-26: 4 mg via INTRAVENOUS
  Filled 2024-05-26: qty 2

## 2024-05-26 MED ORDER — IOHEXOL 350 MG/ML SOLN
75.0000 mL | Freq: Once | INTRAVENOUS | Status: AC | PRN
  Administered 2024-05-26: 75 mL via INTRAVENOUS

## 2024-05-26 MED ORDER — SODIUM CHLORIDE 0.9 % IV SOLN
2.0000 g | Freq: Once | INTRAVENOUS | Status: AC
  Administered 2024-05-26: 2 g via INTRAVENOUS
  Filled 2024-05-26: qty 20

## 2024-05-26 NOTE — ED Triage Notes (Signed)
 Pt came in via POV d/t last Wednesday night began having n/v/d. States that she has continued to have n/v/d & last night noticed hematuria. Denies burning when she urinates, denies flank pain. Endorses 8/10 abd pain.

## 2024-05-26 NOTE — ED Provider Notes (Signed)
 [place] [facility] Provider Note   CSN: [id number] Arrival date & time: 05/26/24  1233     Patient presents with: Abdominal Pain, Emesis, and Diarrhea   Judy Ballard is a 23 y.o. female.  {Add pertinent medical, surgical, social history, OB history to YEP:67052} Patient with history of cholecystectomy, hypertension presents today with complaints of abdominal pain, nausea, vomiting, and diarrhea.  Reports symptoms began on Wednesday and have been persistent since then.  Pain is generalized throughout her abdomen.  Reports associated hematuria that started yesterday.  Denies dysuria or vaginal discharge.  The history is provided by the patient. No language interpreter was used.  Abdominal Pain Associated symptoms: diarrhea and vomiting   Emesis Associated symptoms: abdominal pain and diarrhea   Diarrhea Associated symptoms: abdominal pain and vomiting        Prior to Admission medications   Medication Sig Start Date End Date Taking? Authorizing Provider  cephALEXin  (KEFLEX ) 500 MG capsule Take 1 capsule (500 mg total) by mouth 2 (two) times daily for 7 days. 05/26/24 06/02/24 Yes Seamus Warehime, Lauraine LABOR, PA-C  albuterol  (VENTOLIN  HFA) 108 (90 Base) MCG/ACT inhaler Inhale 1-2 puffs into the lungs every 6 (six) hours as needed for wheezing or shortness of breath. 12/14/23   Raspet, Erin K, PA-C  budesonide -formoterol  (SYMBICORT ) 80-4.5 MCG/ACT inhaler Inhale 2 puffs into the lungs in the morning and at bedtime. 12/14/23   Raspet, Erin K, PA-C  predniSONE  (STERAPRED UNI-PAK 21 TAB) 10 MG (21) TBPK tablet As directed 12/14/23   Raspet, Erin K, PA-C  promethazine -dextromethorphan (PROMETHAZINE -DM) 6.25-15 MG/5ML syrup Take 5 mLs by mouth 2 (two) times daily as needed for cough. 12/14/23   Raspet, Rocky POUR, PA-C    Allergies: Apple juice, Apple, and Fish allergy    Review of Systems  Gastrointestinal:  Positive for abdominal pain, diarrhea and vomiting.  All  other systems reviewed and are negative.   Updated Vital Signs BP 127/84   Pulse (!) 111   Temp 98.5 F (36.9 C) (Oral)   Resp (!) 22   Ht 5' 2 (1.575 m)   Wt 98.9 kg   SpO2 100%   BMI 39.87 kg/m   Physical Exam Vitals and nursing note reviewed.  Constitutional:      General: She is not in acute distress.    Appearance: Normal appearance. She is normal weight. She is not ill-appearing, toxic-appearing or diaphoretic.  HENT:     Head: Normocephalic and atraumatic.  Cardiovascular:     Rate and Rhythm: Normal rate.  Pulmonary:     Effort: Pulmonary effort is normal. No respiratory distress.  Abdominal:     General: Abdomen is flat.     Palpations: Abdomen is soft.     Tenderness: There is abdominal tenderness. There is no guarding or rebound.  Musculoskeletal:        General: Normal range of motion.     Cervical back: Normal range of motion.  Skin:    General: Skin is warm and dry.  Neurological:     General: No focal deficit present.     Mental Status: She is alert.  Psychiatric:        Mood and Affect: Mood normal.        Behavior: Behavior normal.     (all labs ordered are listed, but only abnormal results are displayed) Labs Reviewed  COMPREHENSIVE METABOLIC PANEL WITH GFR - Abnormal; Notable for the following components:  Result Value   Glucose, Bld 120 (*)    All other components within normal limits  URINALYSIS, ROUTINE W REFLEX MICROSCOPIC - Abnormal; Notable for the following components:   Color, Urine RED (*)    APPearance HAZY (*)    Hgb urine dipstick MODERATE (*)    Protein, ur 100 (*)    Nitrite POSITIVE (*)    Leukocytes,Ua TRACE (*)    Bacteria, UA RARE (*)    All other components within normal limits  LIPASE, BLOOD  CBC  HCG, SERUM, QUALITATIVE    EKG: None  Radiology: CT ABDOMEN PELVIS W CONTRAST Result Date: 05/26/2024 EXAM: CT ABDOMEN AND PELVIS WITH CONTRAST 05/26/2024 02:44:34 PM TECHNIQUE: CT of the abdomen and pelvis was  performed with the administration of 75 mL of iohexol  (OMNIPAQUE ) 350 MG/ML injection. Multiplanar reformatted images are provided for review. Automated exposure control, iterative reconstruction, and/or weight-based adjustment of the mA/kV was utilized to reduce the radiation dose to as low as reasonably achievable. COMPARISON: 12/22/2022 CLINICAL HISTORY: Abdominal pain, acute, nonlocalized. FINDINGS: LOWER CHEST: No acute abnormality. LIVER: The liver is unremarkable. GALLBLADDER AND BILE DUCTS: Status post cholecystectomy. No biliary ductal dilatation. SPLEEN: No acute abnormality. PANCREAS: No acute abnormality. ADRENAL GLANDS: No acute abnormality. KIDNEYS, URETERS AND BLADDER: Mild right renal cortical scarring is noted. No stones in the kidneys or ureters. No hydronephrosis. No perinephric or periureteral stranding. Urinary bladder is unremarkable. GI AND BOWEL: Stomach demonstrates no acute abnormality. There is no bowel obstruction. PERITONEUM AND RETROPERITONEUM: No ascites. No free air. VASCULATURE: Aorta is normal in caliber. LYMPH NODES: No lymphadenopathy. REPRODUCTIVE ORGANS: No acute abnormality. BONES AND SOFT TISSUES: No acute osseous abnormality. No focal soft tissue abnormality. IMPRESSION: 1. No acute findings in the abdomen or pelvis. 2. Status post cholecystectomy. 3. Mild right renal cortical scarring. Electronically signed by: Lynwood Seip MD 05/26/2024 03:03 PM EDT RP Workstation: [id number]    {Document cardiac monitor, telemetry assessment procedure when appropriate:32947} Procedures   Medications Ordered in the ED  cefTRIAXone  (ROCEPHIN ) 2 g in sodium chloride  0.9 % 100 mL IVPB (has no administration in time range)  ondansetron  (ZOFRAN ) injection 4 mg (4 mg Intravenous Given 05/26/24 1420)  morphine  (PF) 4 MG/ML injection 4 mg (4 mg Intravenous Given 05/26/24 1419)  sodium chloride  0.9 % bolus 1,000 mL (1,000 mLs Intravenous New Bag/Given 05/26/24 1420)  iohexol  (OMNIPAQUE ) 350  MG/ML injection 75 mL (75 mLs Intravenous Contrast Given 05/26/24 1445)    Clinical Course as of 05/26/24 1514  Sat May 26, 2024  1503 S. 22y F. UTI, abd pain, N/V. Rocephin  ordered.  Pending CT [SA]    Clinical Course User Index [SA] Billy Pal, MD   {Click here for ABCD2, HEART and other calculators REFRESH Note before signing:1}                              Medical Decision Making Amount and/or Complexity of Data Reviewed Labs: ordered. Radiology: ordered.  Risk Prescription drug management.   This patient is a 23 y.o. female who presents to the ED for concern of ***, this involves an extensive number of treatment options, and is a complaint that carries with it a high risk of complications and morbidity. The emergent differential diagnosis prior to evaluation includes, but is not limited to,  *** . This is not an exhaustive differential.   Past Medical History / Co-morbidities / Social History: ***  Additional history: Chart  reviewed. Pertinent results include: ***  Physical Exam: Physical exam performed. The pertinent findings include: ***  Lab Tests: I ordered, and personally interpreted labs.  The pertinent results include:  ***   Imaging Studies: I ordered imaging studies including ***. I independently visualized and interpreted imaging which showed ***. I agree with the radiologist interpretation.   Cardiac Monitoring:  The patient was maintained on a cardiac monitor.  My attending physician Dr. PIERRETTE viewed and interpreted the cardiac monitored which showed an underlying rhythm of: ***. I agree with this interpretation.   Medications: I ordered medication including ***  for ***. Reevaluation of the patient after these medicines showed that the patient {resolved/improved/worsened:23923::improved}. I have reviewed the patients home medicines and have made adjustments as needed.  Consultations Obtained: I requested consultation with the ***,  and discussed lab  and imaging findings as well as pertinent plan - they recommend: ***   Disposition: After consideration of the diagnostic results and the patients response to treatment, I feel that *** .   ***emergency department workup does not suggest an emergent condition requiring admission or immediate intervention beyond what has been performed at this time. The plan is: ***. The patient is safe for discharge and has been instructed to return immediately for worsening symptoms, change in symptoms or any other concerns.  I discussed this case with my attending physician Dr. PIERRETTE who cosigned this note including patient's presenting symptoms, physical exam, and planned diagnostics and interventions. Attending physician stated agreement with plan or made changes to plan which were implemented.     {Document critical care time when appropriate  Document review of labs and clinical decision tools ie CHADS2VASC2, etc  Document your independent review of radiology images and any outside records  Document your discussion with family members, caretakers and with consultants  Document social determinants of health affecting pt's care  Document your decision making why or why not admission, treatments were needed:32947:::1}   Final diagnoses:  Acute cystitis with hematuria    ED Discharge Orders          Ordered    cephALEXin  (KEFLEX ) 500 MG capsule  2 times daily        05/26/24 1511

## 2024-05-26 NOTE — Discharge Instructions (Addendum)
 As we discussed, your urine showed signs of infection today.  This is likely the cause of your symptoms.  I have given you a dose of IV antibiotics in the hospital today and have prescribed you antibiotics that you need to fill and take as prescribed its entirety for management of your symptoms.  The antibiotics are Keflex .  I have also given you Zofran  which is a nausea medication you can take as prescribed as needed.  You can also take the Pyridium for pain with urination.  Call your PCP to schedule close follow-up appointment.  Return if development of any new or worsening symptoms.

## 2024-08-19 ENCOUNTER — Emergency Department (HOSPITAL_COMMUNITY)
Admission: EM | Admit: 2024-08-19 | Discharge: 2024-08-19 | Disposition: A | Discharge status: Home / Self Care | Attending: Emergency Medicine | Admitting: Emergency Medicine

## 2024-08-19 DIAGNOSIS — H65192 Other acute nonsuppurative otitis media, left ear: Secondary | ICD-10-CM | POA: Insufficient documentation

## 2024-08-19 DIAGNOSIS — H9202 Otalgia, left ear: Secondary | ICD-10-CM | POA: Diagnosis present

## 2024-08-19 DIAGNOSIS — J0100 Acute maxillary sinusitis, unspecified: Secondary | ICD-10-CM | POA: Insufficient documentation

## 2024-08-19 MED ORDER — CETIRIZINE HCL 10 MG PO TABS: 10.0000 mg | ORAL_TABLET | Freq: Every day | ORAL | 0 refills | Status: AC

## 2024-08-19 MED ORDER — LORATADINE 10 MG PO TABS
10.0000 mg | ORAL_TABLET | Freq: Every day | ORAL | Status: DC
  Administered 2024-08-19: 10 mg via ORAL
  Filled 2024-08-19: qty 1

## 2024-08-19 MED ORDER — PSEUDOEPHEDRINE HCL ER 120 MG PO TB12
120.0000 mg | ORAL_TABLET | Freq: Once | ORAL | Status: AC
  Administered 2024-08-19: 120 mg via ORAL
  Filled 2024-08-19: qty 1

## 2024-08-19 MED ORDER — FLUTICASONE PROPIONATE 50 MCG/ACT NA SUSP: 1.0000 | Freq: Every day | NASAL | 0 refills | Status: AC

## 2024-08-19 NOTE — Discharge Instructions (Signed)
 Try using a Nettie pot or saline spray in your left nostril as it is very inflamed.  You can try the Flonase  for a few weeks as well and either the Zyrtec  or Sudafed something to help dry up the fluid.  If you start having a fever, your ear starts draining anything and things are getting worse return to the emergency room or your regular doctor for recheck.  No signs of infection today.

## 2024-08-19 NOTE — ED Provider Notes (Signed)
 " [facility] Provider Note   CSN: [id number] Arrival date & time: 08/19/24  1547     Patient presents with: Otalgia   Judy Ballard is a 24 y.o. female.   Patient is a healthy 24 year old female who is presenting today with complaints of ear pain that started on Friday as well as a mild headache and pain in her left sinuses.  No fever or hearing changes.  She just reports the pain in the ear is getting worse.  Denies history of ear issues in the past.  Reports the headache is only minimal today and its mostly the ear that is bothering her.  The history is provided by the patient.  Otalgia      Prior to Admission medications  Medication Sig Start Date End Date Taking? Authorizing Provider  cetirizine  (ZYRTEC  ALLERGY) 10 MG tablet Take 1 tablet (10 mg total) by mouth daily. 08/19/24  Yes Doretha Folks, MD  fluticasone  (FLONASE ) 50 MCG/ACT nasal spray Place 1 spray into both nostrils daily. 08/19/24  Yes Doretha Folks, MD  albuterol  (VENTOLIN  HFA) 108 (90 Base) MCG/ACT inhaler Inhale 1-2 puffs into the lungs every 6 (six) hours as needed for wheezing or shortness of breath. 12/14/23   Raspet, Erin K, PA-C  budesonide -formoterol  (SYMBICORT ) 80-4.5 MCG/ACT inhaler Inhale 2 puffs into the lungs in the morning and at bedtime. 12/14/23   Raspet, Erin K, PA-C  ondansetron  (ZOFRAN -ODT) 4 MG disintegrating tablet Take 1 tablet (4 mg total) by mouth every 8 (eight) hours as needed. 05/26/24   Smoot, Lauraine LABOR, PA-C  phenazopyridine  (PYRIDIUM ) 200 MG tablet Take 1 tablet (200 mg total) by mouth 3 (three) times daily. 05/26/24   Smoot, Sarah A, PA-C  predniSONE  (STERAPRED UNI-PAK 21 TAB) 10 MG (21) TBPK tablet As directed 12/14/23   Raspet, Erin K, PA-C  promethazine -dextromethorphan (PROMETHAZINE -DM) 6.25-15 MG/5ML syrup Take 5 mLs by mouth 2 (two) times daily as needed for cough. 12/14/23   Raspet, Erin K, PA-C    Allergies: Apple juice, Apple, and  Fish allergy    Review of Systems  HENT:  Positive for ear pain.     Updated Vital Signs BP 126/83   Pulse 84   Temp 98.6 F (37 C)   Resp 14   Ht 5' 2 (1.575 m)   Wt 99.3 kg   SpO2 96%   BMI 40.06 kg/m   Physical Exam Vitals and nursing note reviewed.  HENT:     Left Ear: A middle ear effusion is present. Tympanic membrane is bulging. Tympanic membrane is not injected, perforated or erythematous.     Nose:     Left Turbinates: Enlarged and swollen.     Left Sinus: Maxillary sinus tenderness present.     Mouth/Throat:     Mouth: Mucous membranes are moist.  Cardiovascular:     Rate and Rhythm: Normal rate.  Pulmonary:     Effort: Pulmonary effort is normal.  Skin:    General: Skin is warm.  Neurological:     Mental Status: She is alert.     (all labs ordered are listed, but only abnormal results are displayed) Labs Reviewed - No data to display  EKG: None  Radiology: No results found.   Procedures   Medications Ordered in the ED  loratadine  (CLARITIN ) tablet 10 mg (has no administration in time range)    And  pseudoephedrine  (SUDAFED) 12 hr tablet 120 mg (has no administration in time range)  Medical Decision Making Risk OTC drugs.   Patient presenting today with complaint of ear pain.  Patient does have an effusion of the left ear but no evidence of otitis at this time.  Also has significant swelling of her left nasal turbinate.  Feel that these are most likely connected.  Given symptomatic care.  No indication for antibiotics at this time.     Final diagnoses:  Acute effusion of left ear    ED Discharge Orders          Ordered    cetirizine  (ZYRTEC  ALLERGY) 10 MG tablet  Daily        08/19/24 1611    fluticasone  (FLONASE ) 50 MCG/ACT nasal spray  Daily        08/19/24 1611               Doretha Folks, MD 08/19/24 1619  "

## 2024-08-19 NOTE — ED Triage Notes (Signed)
 Pt here from home with c/o  left ear ache ongoing for 2 days , no fevers
